# Patient Record
Sex: Female | Born: 1960
Health system: Southern US, Community
[De-identification: ages and names within clinical notes are randomized; demographics above are authoritative.]

## PROBLEM LIST (undated history)

## (undated) DIAGNOSIS — F988 Other specified behavioral and emotional disorders with onset usually occurring in childhood and adolescence: Secondary | ICD-10-CM

## (undated) DIAGNOSIS — G709 Myoneural disorder, unspecified: Secondary | ICD-10-CM

## (undated) DIAGNOSIS — Z21 Asymptomatic human immunodeficiency virus [HIV] infection status: Secondary | ICD-10-CM

## (undated) DIAGNOSIS — J189 Pneumonia, unspecified organism: Secondary | ICD-10-CM

## (undated) DIAGNOSIS — B20 Human immunodeficiency virus [HIV] disease: Secondary | ICD-10-CM

## (undated) DIAGNOSIS — E785 Hyperlipidemia, unspecified: Secondary | ICD-10-CM

## (undated) DIAGNOSIS — G8929 Other chronic pain: Secondary | ICD-10-CM

## (undated) DIAGNOSIS — F419 Anxiety disorder, unspecified: Secondary | ICD-10-CM

## (undated) HISTORY — PX: CARPAL TUNNEL RELEASE: SHX101

## (undated) HISTORY — DX: Hyperlipidemia, unspecified: E78.5

## (undated) HISTORY — DX: Other chronic pain: G89.29

## (undated) HISTORY — DX: Myoneural disorder, unspecified: G70.9

## (undated) HISTORY — DX: Asymptomatic human immunodeficiency virus (hiv) infection status: Z21

## (undated) HISTORY — DX: Human immunodeficiency virus (HIV) disease: B20

## (undated) HISTORY — DX: Anxiety disorder, unspecified: F41.9

## (undated) HISTORY — DX: Other specified behavioral and emotional disorders with onset usually occurring in childhood and adolescence: F98.8

---
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
## 1989-07-11 HISTORY — PX: ABDOMINAL HYSTERECTOMY: SHX81

## 1991-07-12 HISTORY — PX: TARSAL TUNNEL RELEASE: SUR1099

## 1991-07-12 HISTORY — PX: TUMOR REMOVAL: SHX12

## 2008-02-21 ENCOUNTER — Ambulatory Visit: Payer: Self-pay | Admitting: Family Medicine

## 2008-02-21 DIAGNOSIS — I10 Essential (primary) hypertension: Secondary | ICD-10-CM

## 2008-02-21 DIAGNOSIS — E119 Type 2 diabetes mellitus without complications: Secondary | ICD-10-CM | POA: Insufficient documentation

## 2008-02-21 DIAGNOSIS — E785 Hyperlipidemia, unspecified: Secondary | ICD-10-CM

## 2008-02-22 ENCOUNTER — Encounter: Payer: Self-pay | Admitting: Family Medicine

## 2008-02-22 ENCOUNTER — Telehealth (INDEPENDENT_AMBULATORY_CARE_PROVIDER_SITE_OTHER): Payer: Self-pay | Admitting: *Deleted

## 2008-02-22 LAB — CONVERTED CEMR LAB
AST: 14 units/L (ref 0–37)
Albumin: 4.3 g/dL (ref 3.5–5.2)
Alkaline Phosphatase: 105 units/L (ref 39–117)
Calcium: 9.2 mg/dL (ref 8.4–10.5)
Chloride: 109 meq/L (ref 96–112)
Glucose, Bld: 80 mg/dL (ref 70–99)
Potassium: 4.2 meq/L (ref 3.5–5.3)
Sodium: 142 meq/L (ref 135–145)
Total Protein: 7.2 g/dL (ref 6.0–8.3)

## 2008-03-31 ENCOUNTER — Encounter: Admission: RE | Admit: 2008-03-31 | Discharge: 2008-03-31 | Payer: Self-pay | Admitting: Family Medicine

## 2008-04-01 ENCOUNTER — Telehealth: Payer: Self-pay | Admitting: Family Medicine

## 2008-05-30 ENCOUNTER — Telehealth: Payer: Self-pay | Admitting: Family Medicine

## 2008-09-02 ENCOUNTER — Telehealth: Payer: Self-pay | Admitting: Family Medicine

## 2008-09-02 ENCOUNTER — Ambulatory Visit: Payer: Self-pay | Admitting: Family Medicine

## 2008-09-02 DIAGNOSIS — G4700 Insomnia, unspecified: Secondary | ICD-10-CM

## 2008-09-02 DIAGNOSIS — F329 Major depressive disorder, single episode, unspecified: Secondary | ICD-10-CM

## 2008-09-02 LAB — CONVERTED CEMR LAB
Creatinine,U: 50 mg/dL
Hgb A1c MFr Bld: 5.8 %

## 2008-10-01 ENCOUNTER — Telehealth: Payer: Self-pay | Admitting: Family Medicine

## 2008-10-29 ENCOUNTER — Encounter: Payer: Self-pay | Admitting: Family Medicine

## 2008-12-31 ENCOUNTER — Encounter: Payer: Self-pay | Admitting: Family Medicine

## 2009-01-28 ENCOUNTER — Ambulatory Visit: Payer: Self-pay | Admitting: Family Medicine

## 2009-01-28 DIAGNOSIS — M545 Low back pain: Secondary | ICD-10-CM

## 2009-01-28 LAB — CONVERTED CEMR LAB: Hgb A1c MFr Bld: 5.7 %

## 2009-01-29 LAB — CONVERTED CEMR LAB
ALT: 17 units/L (ref 0–35)
AST: 17 units/L (ref 0–37)
Alkaline Phosphatase: 90 units/L (ref 39–117)
Calcium: 9.9 mg/dL (ref 8.4–10.5)
Chloride: 107 meq/L (ref 96–112)
Cholesterol, target level: 200 mg/dL
Creatinine, Ser: 0.81 mg/dL (ref 0.40–1.20)
HDL: 57 mg/dL (ref >39)
LDL Cholesterol: 122 mg/dL — ABNORMAL HIGH (ref 0–99)
TSH: 2.238 microintl units/mL (ref 0.350–4.500)
Total Bilirubin: 0.4 mg/dL (ref 0.3–1.2)
Total CHOL/HDL Ratio: 3.6
VLDL: 25 mg/dL (ref 0–40)

## 2009-04-20 ENCOUNTER — Telehealth: Payer: Self-pay | Admitting: Family Medicine

## 2009-04-28 ENCOUNTER — Ambulatory Visit: Payer: Self-pay | Admitting: Family Medicine

## 2009-04-28 DIAGNOSIS — F17200 Nicotine dependence, unspecified, uncomplicated: Secondary | ICD-10-CM | POA: Insufficient documentation

## 2009-05-26 ENCOUNTER — Ambulatory Visit: Payer: Self-pay | Admitting: Family Medicine

## 2009-05-26 DIAGNOSIS — M25569 Pain in unspecified knee: Secondary | ICD-10-CM

## 2009-05-27 ENCOUNTER — Encounter: Payer: Self-pay | Admitting: Family Medicine

## 2009-05-27 LAB — CONVERTED CEMR LAB
LDL Cholesterol: 74 mg/dL (ref 0–99)
Total CHOL/HDL Ratio: 3.2
VLDL: 22 mg/dL (ref 0–40)

## 2009-06-11 ENCOUNTER — Telehealth: Payer: Self-pay | Admitting: Family Medicine

## 2009-06-18 ENCOUNTER — Encounter: Payer: Self-pay | Admitting: Family Medicine

## 2009-06-19 ENCOUNTER — Encounter: Payer: Self-pay | Admitting: Family Medicine

## 2009-06-25 ENCOUNTER — Encounter: Payer: Self-pay | Admitting: Family Medicine

## 2009-07-13 ENCOUNTER — Encounter: Payer: Self-pay | Admitting: Family Medicine

## 2009-07-20 ENCOUNTER — Encounter: Payer: Self-pay | Admitting: Family Medicine

## 2009-07-30 ENCOUNTER — Encounter: Payer: Self-pay | Admitting: Family Medicine

## 2009-08-16 ENCOUNTER — Encounter: Payer: Self-pay | Admitting: Family Medicine

## 2009-08-17 ENCOUNTER — Ambulatory Visit: Payer: Self-pay | Admitting: Family Medicine

## 2009-08-27 ENCOUNTER — Ambulatory Visit: Payer: Self-pay | Admitting: Family Medicine

## 2009-08-28 ENCOUNTER — Telehealth (INDEPENDENT_AMBULATORY_CARE_PROVIDER_SITE_OTHER): Payer: Self-pay | Admitting: *Deleted

## 2009-09-02 ENCOUNTER — Telehealth: Payer: Self-pay | Admitting: Family Medicine

## 2009-09-03 ENCOUNTER — Encounter: Payer: Self-pay | Admitting: Family Medicine

## 2009-11-17 ENCOUNTER — Telehealth: Payer: Self-pay | Admitting: Family Medicine

## 2009-11-23 ENCOUNTER — Encounter: Payer: Self-pay | Admitting: Family Medicine

## 2010-01-25 ENCOUNTER — Encounter: Payer: Self-pay | Admitting: Family Medicine

## 2010-05-20 ENCOUNTER — Ambulatory Visit: Payer: Self-pay | Admitting: Family Medicine

## 2010-05-20 LAB — CONVERTED CEMR LAB
Creatinine,U: 300 mg/dL
Microalbumin U total vol: 30 mg/L

## 2010-05-21 ENCOUNTER — Telehealth: Payer: Self-pay | Admitting: Family Medicine

## 2010-06-01 ENCOUNTER — Encounter: Admission: RE | Admit: 2010-06-01 | Discharge: 2010-06-01 | Payer: Self-pay | Admitting: Family Medicine

## 2010-06-01 ENCOUNTER — Encounter: Payer: Self-pay | Admitting: Family Medicine

## 2010-06-02 DIAGNOSIS — D696 Thrombocytopenia, unspecified: Secondary | ICD-10-CM

## 2010-06-02 LAB — CONVERTED CEMR LAB
ALT: 16 units/L (ref 0–35)
AST: 15 units/L (ref 0–37)
Alkaline Phosphatase: 92 units/L (ref 39–117)
BUN: 21 mg/dL (ref 6–23)
Basophils Absolute: 0 10*3/uL (ref 0.0–0.1)
Basophils Relative: 1 % (ref 0–1)
Calcium: 9.2 mg/dL (ref 8.4–10.5)
Chloride: 109 meq/L (ref 96–112)
Creatinine, Ser: 0.7 mg/dL (ref 0.40–1.20)
Eosinophils Absolute: 0.1 10*3/uL (ref 0.0–0.7)
HDL: 59 mg/dL (ref >39)
LDL Cholesterol: 82 mg/dL (ref 0–99)
MCHC: 32.8 g/dL (ref 30.0–36.0)
MCV: 89 fL (ref 78.0–100.0)
Monocytes Absolute: 0.4 10*3/uL (ref 0.1–1.0)
Monocytes Relative: 6 % (ref 3–12)
Neutro Abs: 4.7 10*3/uL (ref 1.7–7.7)
Neutrophils Relative %: 71 % (ref 43–77)
Potassium: 4.5 meq/L (ref 3.5–5.3)
RDW: 14.9 % (ref 11.5–15.5)
TSH: 0.658 microintl units/mL (ref 0.350–4.500)
Total CHOL/HDL Ratio: 2.8

## 2010-06-08 ENCOUNTER — Encounter: Payer: Self-pay | Admitting: Family Medicine

## 2010-06-08 ENCOUNTER — Encounter: Admission: RE | Admit: 2010-06-08 | Discharge: 2010-06-08 | Payer: Self-pay | Admitting: Family Medicine

## 2010-06-09 LAB — CONVERTED CEMR LAB: Uric Acid, Serum: 5.2 mg/dL (ref 2.4–7.0)

## 2010-07-21 ENCOUNTER — Telehealth: Payer: Self-pay | Admitting: Family Medicine

## 2010-08-01 ENCOUNTER — Encounter: Payer: Self-pay | Admitting: Family Medicine

## 2010-08-10 NOTE — Letter (Signed)
Summary: [facility]  [facility]   Imported By: Lanelle Bal 08/28/2009 11:00:58  _____________________________________________________________________  External Attachment:    Type:   Image     Comment:   External Document

## 2010-08-10 NOTE — Progress Notes (Signed)
----   Converted from flag ---- ---- 08/28/2009 8:16 AM, Nani Gasser MD wrote: Needs admit notes and d/c summary from her hospitalization for overdose at [facility]. ------------------------------  faxed for medical records from [facility]. KJ LPN

## 2010-08-10 NOTE — Progress Notes (Signed)
Summary: meds  Phone Note Call from Patient   Caller: Patient Call For: Nani Gasser MD Summary of Call: Pt called and states she was supposed to get  a refil of [facility] Initial call taken by: Avon Gully CMA, Duncan Dull),  May 21, 2010 2:35 PM  Follow-up for Phone Call        Rx Called In Follow-up by: Nani Gasser MD,  May 21, 2010 3:04 PM    Prescriptions: LEXAPRO 20 MG TABS (ESCITALOPRAM OXALATE) Take 1-2 tablets by mouth once a day  #60 x 3   Entered and Authorized by:   Nani Gasser MD   Signed by:   Nani Gasser MD on 05/21/2010   Method used:   Electronically to        [facility] (retail)       [address]       [place], [place]  [address]       Ph: [phone]       Fax: [phone]   RxID:   [id number] SEROQUEL XR 150 MG XR24H-TAB (QUETIAPINE FUMARATE) Take one tablet by mouth at bedtime  #30 x 3   Entered and Authorized by:   Nani Gasser MD   Signed by:   Nani Gasser MD on 05/21/2010   Method used:   Electronically to        [facility] (retail)       [address]       [place], [place]  [address]       Ph: [phone]       Fax: [phone]   RxID:   [id number]

## 2010-08-10 NOTE — Consult Note (Signed)
Summary: [facility]   Imported By: Lanelle Bal 07/23/2009 13:46:05  _____________________________________________________________________  External Attachment:    Type:   Image     Comment:   External Document

## 2010-08-10 NOTE — Assessment & Plan Note (Signed)
Summary: HOSPITAL F/U   Vital Signs:  Patient profile:   50 year old female Height:      66 inches Weight:      221 pounds Pulse rate:   85 / minute BP sitting:   117 / 70  (left arm) Cuff size:   large  Vitals Entered By: Kathlene November (August 27, 2009 3:40 PM) CC: hospital followup- took too many Soma to try and get some sleep   Primary Care Provider:  Nani Gasser MD  CC:  hospital followup- took too many Soma to try and get some sleep.  History of Present Illness: hospital followup- took too many Soma to try and get some sleep. Was admitted to [facility] for OD of soma on 08/13/09.  Denies any intential OD. Says too too many to try to sleep.  Now has stopped teh soma adn getting spasm in her medications.  had her stomach pumped. Did have a seizure. I don't have notes but will obtain these from her admission. Stil having trouble sleeping. Insomnia has been a chronic problem for her.    Was seeing Dr. Marlena Clipper (neruology).  He is now not conviced that she has MS so is considering a second opinoin.  Says this has been difficulty to accept because she has been told has MS for the last 20 years.   Current Medications (verified): 1)  Oxycodone Hcl 15 Mg  Tabs (Oxycodone Hcl) .... Take 2 Tabs in The Am 2 Tabs At Lunch 2 Tabs in The Evening and 1 Tab At Bedtime 2)  Carisoprodol 350 Mg  Tabs (Carisoprodol) .... Take 2-4 Tabs By Mouth Daily 3)  Topamax 25 Mg  Tabs (Topiramate) .... Take 4 Tablets By Mouth Once A Day 4)  Lyrica 150 Mg Caps (Pregabalin) .... Take 1 Tablet By Mouth Two Times A Day 5)  Lisinopril 20 Mg  Tabs (Lisinopril) .... Take One Tablet By Mouth Once A Day 6)  Crestor 20 Mg Tabs (Rosuvastatin Calcium) .... Take 1 Tablet By Mouth Once A Day 7)  Imitrex 100 Mg  Tabs (Sumatriptan Succinate) .... As Needed For Migraines 8)  Humalog 100 Unit/ml  Soln (Insulin Lispro (Human)) .... Use Per Ssi 9)  Please Refit Brace Form The Left Leg .... Dx: Leg Weakness and Gait  Instability 10)  Bd Insulin Syringe 30g X 1/2" 0.5 Ml  Misc (Insulin Syringe-Needle U-100) .... Use As Directed Up To 6 Times Per Day Dx:250.00 11)  Tramadol Hcl 50 Mg Tabs (Tramadol Hcl) .... One By Mouth Every 6 Hours As Needed As Needed Pain 12)  Seroquel Xr 150 Mg Xr24h-Tab (Quetiapine Fumarate) .... Take One Tablet By Mouth At Bedtime 13)  Benzonatate 100 Mg Caps (Benzonatate) .... Take One Tablet By Mouth A T Bedtime 14)  Copaxone 20 Mg/ml Kit (Glatiramer Acetate) .... Inject 1 Ml Everyday 15)  Nystatin 500000 Unit Tabs (Nystatin) .... Take One Tablet By Mouth Up To Three Times A Day 16)  Lorazepam 0.5 Mg Tabs (Lorazepam) .... Take One Tablet By Mouth Up To Three Times A Day 17)  Clonidine Hcl 0.1 Mg Tabs (Clonidine Hcl) .... Take One - Two Tablets By Mouth Daily 18)  Adderall 10 Mg Tabs (Amphetamine-Dextroamphetamine) .... Take One Tablet By Mouth Once A Day 19)  Clonazepam 1 Mg Tabs (Clonazepam) .... Take 1-2 Tablets By Mouth Daily 20)  Lexapro 20 Mg Tabs (Escitalopram Oxalate) .... Take 1-2 Tablets By Mouth Once A Day  Allergies (verified): 1)  ! * Cillins  Comments:  Nurse/Medical Assistant: The patient's medications and allergies were reviewed with the patient and were updated in the Medication and Allergy Lists. Kathlene November (August 27, 2009 3:41 PM)  Physical Exam  General:  Well-developed,well-nourished,in no acute distress; alert,appropriate and cooperative throughout examination Head:  Normocephalic and atraumatic without obvious abnormalities. No apparent alopecia or balding. Lungs:  Normal respiratory effort, chest expands symmetrically. Lungs are clear to auscultation, no crackles or wheezes. Heart:  Normal rate and regular rhythm. S1 and S2 normal without gallop, murmur, click, rub or other extra sounds. Skin:  no rashes.   Psych:  Cognition and judgment appear intact. Alert and cooperative with normal attention span and concentration. No apparent delusions,  illusions, hallucinations   Impression & Recommendations:  Problem # 1:  INSOMNIA (ICD-780.52) I agree with pt that she should not restart the soma. She was interested in restarting flexeril but I explained that she needs to speak with a pain specialist about this. Afer her OD I do feel this is appropriate.  I would certainly be happy to refer her to a sleep specialist if she would like.    Problem # 2:  DIABETES MELLITUS, CONTROLLED (ICD-250.00) She says the checked her labs in teh hospital.  She hasn't been here for DM f/u in almost a year. [facility]. Suspect she is still missing some thinks like a microalbumin. Will review and let her know an have her schedule a DM f/u.   Her updated medication list for this problem includes:    Lisinopril 20 Mg Tabs (Lisinopril) .Marland Kitchen... Take one tablet by mouth once a day    Humalog 100 Unit/ml Soln (Insulin lispro (human)) ..... Use per ssi  Problem # 3:  HYPERTENSION, BENIGN (ICD-401.1) Looks great today.  Her updated medication list for this problem includes:    Lisinopril 20 Mg Tabs (Lisinopril) .Marland Kitchen... Take one tablet by mouth once a day    Clonidine Hcl 0.1 Mg Tabs (Clonidine hcl) .Marland Kitchen... Take one - two tablets by mouth daily  Complete Medication List: 1)  Oxycodone Hcl 30 Mg Tabs (Oxycodone hcl) .... Take up to 6 a day. 2)  Topamax 25 Mg Tabs (Topiramate) .... Take 4 tablets by mouth once a day 3)  Lyrica 150 Mg Caps (Pregabalin) .... Take 1 tablet by mouth two times a day 4)  Lisinopril 20 Mg Tabs (Lisinopril) .... Take one tablet by mouth once a day 5)  Crestor 20 Mg Tabs (Rosuvastatin calcium) .... Take 1 tablet by mouth once a day 6)  Humalog 100 Unit/ml Soln (Insulin lispro (human)) .... Use per ssi 7)  Please Refit Brace Form The Left Leg  .... Dx: leg weakness and gait instability 8)  Bd Insulin Syringe 30g X 1/2" 0.5 Ml Misc (Insulin syringe-needle u-100) .... Use as directed up to 6 times per day  dx:250.00 9)  Seroquel Xr 150 Mg Xr24h-tab (Quetiapine fumarate) .... Take one tablet by mouth at bedtime 10)  Lorazepam 0.5 Mg Tabs (Lorazepam) .... Take one tablet by mouth up to three times a day 11)  Clonidine Hcl 0.1 Mg Tabs (Clonidine hcl) .... Take one - two tablets by mouth daily 12)  Adderall 10 Mg Tabs (Amphetamine-dextroamphetamine) .... Take one tablet by mouth once a day 13)  Clonazepam 1 Mg Tabs (Clonazepam) .... Take 1-2 tablets by mouth daily 14)  Lexapro 20 Mg Tabs (Escitalopram oxalate) .... Take 1-2 tablets by mouth once a day Prescriptions: PLEASE REFIT BRACE FORM  THE LEFT LEG Dx: Leg weakness and gait instability  #1 x 0   Entered and Authorized by:   Nani Gasser MD   Signed by:   Nani Gasser MD on 08/27/2009   Method used:   Print then Give to Patient   RxID:   [id number] BD INSULIN SYRINGE 30G X 1/2" 0.5 ML  MISC (INSULIN SYRINGE-NEEDLE U-100) USE AS DIRECTED UP TO 6 TIMES PER DAY DX:250.00  #3MTH SUPPLY x 3   Entered and Authorized by:   Nani Gasser MD   Signed by:   Nani Gasser MD on 08/27/2009   Method used:   Print then Give to Patient   RxID:   [id number]

## 2010-08-10 NOTE — Assessment & Plan Note (Signed)
Summary: BAck Pain, DM, jaundice   Vital Signs:  Patient profile:   50 year old female Height:      66 inches Weight:      238 pounds Pulse rate:   100 / minute BP sitting:   139 / 80  (right arm) Cuff size:   regular  Vitals Entered By: Avon Gully CMA, Duncan Dull) (May 20, 2010 3:28 PM)   CC: f/u DM   Primary Care Avari Gelles:  Nani Gasser MD  CC:  f/u DM.  History of Present Illness:   Hs noticed some jaudice in her eyes. she says she has had two episodes of this previously lasting for just a couple of days.  She has had a total of 3 episodes in the last year.  She denies any prior history of liver problems or hepatitis.   Sister died form liver and pancreatic cancer (stage 4 ). Feels fatigued as well. she also notes a recent significant weight gain.  She says she has been trying to eat well and has been following a diabetic diet and is frustrated that she has gained weight.  She is not getting any regular exercise.  She says she's only in 1500 calories a day on average.  She rarely uses her insulin only when her sugars are elevated.  She is also here for diabetic follow-up today.  She did not bring in her glucometer.  She only checks them occasionally.  She is also having some Pain in her lower back. Hx of OA in her back.  Her low back feels weak wiht house work.  Feels like has to sit down after very little effort. her back pain is exacerbated by work and relieved some by sitting that her pain is still persistent.  According to the patient she does take pain medications.     Diabetes Management History:      The patient is a 50 years old female who comes in for evaluation of DM Type 2.  She has not been enrolled in the "Diabetic Education Program".  She states understanding of dietary principles and is following her diet appropriately.  She is checking home blood sugars.  She says that she is not exercising regularly.        Hypoglycemic symptoms are not occurring.  No  hyperglycemic symptoms are reported.  Other comments include: Just using insulin when her sugars are high. Says she is eating a diabetic diet a day. Says she has been trying to lose weight.  Trying to walk once a day. .        There are no symptoms to suggest diabetic complications.  No changes have been made to her treatment plan since last visit.  Treatment plan changes were initiated by patient.    Current Medications (verified): 1)  Oxycodone Hcl 30 Mg Tabs (Oxycodone Hcl) .... Take Up To 6 A Day. 2)  Lyrica 150 Mg Caps (Pregabalin) .... Take 1 Tablet By Mouth Two Times A Day 3)  Lisinopril 20 Mg  Tabs (Lisinopril) .... Take One Tablet By Mouth Once A Day 4)  Crestor 20 Mg Tabs (Rosuvastatin Calcium) .... Take 1 Tablet By Mouth Once A Day 5)  Humalog 100 Unit/ml  Soln (Insulin Lispro (Human)) .... Use Per Ssi 6)  Please Refit Brace Form The Left Leg .... Dx: Leg Weakness and Gait Instability 7)  Bd Insulin Syringe 30g X 1/2" 0.5 Ml  Misc (Insulin Syringe-Needle U-100) .... Use As Directed Up To 6 Times Per  Day Dx:250.00 8)  Seroquel Xr 150 Mg Xr24h-Tab (Quetiapine Fumarate) .... Take One Tablet By Mouth At Bedtime 9)  Adderall 10 Mg Tabs (Amphetamine-Dextroamphetamine) .... Take One Tablet By Mouth Once A Day 10)  Lexapro 20 Mg Tabs (Escitalopram Oxalate) .... Take 1-2 Tablets By Mouth Once A Day 11)  Amitriptyline Hcl 25 Mg Tabs (Amitriptyline Hcl) .... Take 3 Tablets By Mouth Three Times A Day At Bedtime  Allergies (verified): 1)  ! * Cillins  Comments:  Nurse/Medical Assistant: The patient's medications and allergies were reviewed with the patient and were updated in the Medication and Allergy Lists. Avon Gully CMA, Duncan Dull) (May 20, 2010 3:37 PM)  Past History:  Past Surgical History: Last updated: 05/26/2009 c/sec 1980 Hernia repair 1976 Complete  hystectomy 1991 Carpal Tunnel both wrist [id number] Tarsal tunne surgery Left ankle 1993 or 4.  Tumor removed form left  thigh 1993.   Family History: Father with hi cholesterol, HTN, DM, MI Aunt with BrCa Mother with Brain Ca and Lung Ca, smoker, COPD 3 uncles and 2 aunts with stroke Siste with hepatic and pancreatic cancer, died.    Physical Exam  General:  Well-developed,well-nourished,in no acute distress; alert,appropriate and cooperative throughout examination Head:  Normocephalic and atraumatic without obvious abnormalities. No apparent alopecia or balding. Eyes:  no sclera icterus today.  Lungs:  Normal respiratory effort, chest expands symmetrically. Lungs are clear to auscultation, no crackles or wheezes. Heart:  Normal rate and regular rhythm. S1 and S2 normal without gallop, murmur, click, rub or other extra sounds. Abdomen:  soft, normal bowel sounds, no distention, no masses, no hepatomegaly, and no splenomegaly.  TEnder diffusely but very tender over the RLQ and the LLQ.   Msk:  Normal flexion, dec extension.  Normal side bending. Normal rotation of lumbar spine to the right. Dec to the left.  she is tender over the lumbar spine and the SI joints bilaterally.  negative straight leg raise bilaterally.  Hip knee and ankle strength are 5 out of 5. Pulses:  Radial 2+  Neurologic:  alert & oriented X3 and gait normal.   Skin:  No jaudcie on exam today.  Cervical Nodes:  no anterior cervical adenopathy that she feels tender on exam.   Psych:  Cognition and judgment appear intact. Alert and cooperative with normal attention span and concentration. No apparent delusions, illusions, hallucinations   Impression & Recommendations:  Problem # 1:  BACK PAIN, LUMBAR (ICD-724.2) This is part of her chronic pain. Not really sure why she is not addressing this with pain management. Will get an lumbar film as well.  Recommend PT if xray is fairly normal. Will call with the results.    certainly her recent weight gain may be contributing to her pain levels. Her updated medication list for this problem  includes:    Oxycodone Hcl 30 Mg Tabs (Oxycodone hcl) .Marland Kitchen... Take up to 6 a day.  Orders: T-DG Lumbar Spine 2-3 Views (72100)  Problem # 2:  DIABETES MELLITUS, CONTROLLED (ICD-250.00) Well controlled Looks great today.  Let change to metformin daily and then can stop the inuslin. though I have a feeling she will probably not take metformin.  I do recommend weight loss.  That she really is only eating 1500 calories a day she should not be gaining weight.  Will check TSH to make sure she is not hypothyroid. declined flu vaccine today. Her updated medication list for this problem includes:    Lisinopril 20 Mg Tabs (Lisinopril) .Marland KitchenMarland KitchenMarland KitchenMarland Kitchen  Take one tablet by mouth once a day    Humalog 100 Unit/ml Soln (Insulin lispro (human)) ..... Use per ssi    Metformin Hcl 500 Mg Xr24h-tab (Metformin hcl) .Marland Kitchen... Take 1 tablet by mouth once a day  Orders: Fingerstick (36416) Hemoglobin A1C (83036) Urine Microalbumin (60737) Creatinine  (82570) T-TSH (10626-94854)  Problem # 3:  JAUNDICE (ICD-782.4) this is unclear in etiology.  Surprise that this is the first time she's mentioned jaundiced.  She does not have a distended liver on exam today but I will check her liver enzymes and a CBC.  I am concerned that her sister has a history of hepatic and pancreatic cancer.  I asked her she knew the cause of her suspected cause for her sister's cancer and she said she did not.  She says her sister was not a drinker. Orders: T-CBC w/Diff ([id number])  Complete Medication List: 1)  Oxycodone Hcl 30 Mg Tabs (Oxycodone hcl) .... Take up to 6 a day. 2)  Lyrica 150 Mg Caps (Pregabalin) .... Take 1 tablet by mouth two times a day 3)  Lisinopril 20 Mg Tabs (Lisinopril) .... Take one tablet by mouth once a day 4)  Crestor 20 Mg Tabs (Rosuvastatin calcium) .... Take 1 tablet by mouth once a day 5)  Humalog 100 Unit/ml Soln (Insulin lispro (human)) .... Use per ssi 6)  Please Refit Brace Form The Left Leg  .... Dx: leg weakness  and gait instability 7)  Bd Insulin Syringe 30g X 1/2" 0.5 Ml Misc (Insulin syringe-needle u-100) .... Use as directed up to 6 times per day dx:250.00 8)  Seroquel Xr 150 Mg Xr24h-tab (Quetiapine fumarate) .... Take one tablet by mouth at bedtime 9)  Adderall 10 Mg Tabs (Amphetamine-dextroamphetamine) .... Take one tablet by mouth once a day 10)  Lexapro 20 Mg Tabs (Escitalopram oxalate) .... Take 1-2 tablets by mouth once a day 11)  Amitriptyline Hcl 25 Mg Tabs (Amitriptyline hcl) .... Take 3 tablets by mouth three times a day at bedtime 12)  Metformin Hcl 500 Mg Xr24h-tab (Metformin hcl) .... Take 1 tablet by mouth once a day  Other Orders: T-Comprehensive Metabolic Panel [phone]) T-Lipid Profile (96789-38101)  Diabetes Management Assessment/Plan:      The following lipid goals have been established for the patient: Total cholesterol goal of 200; LDL cholesterol goal of 100; HDL cholesterol goal of 40; Triglyceride goal of 150.    Contraindications/Deferment of Procedures/Staging:    Test/Procedure: FLU VAX    Reason for deferment: patient declined   Patient Instructions: 1)  Let change to metformin daily and then can stop the inuslin.  2)  Please schedule a follow-up appointment in 2 weeks to discuss some of your other problems. 3)  We call you with the xray results and the labs.   Prescriptions: METFORMIN HCL 500 MG XR24H-TAB (METFORMIN HCL) Take 1 tablet by mouth once a day  #30 x 1   Entered and Authorized by:   Nani Gasser MD   Signed by:   Nani Gasser MD on 05/20/2010   Method used:   Electronically to        [facility] (retail)       [address]       [place], [place]  [address]       Ph: [phone]       Fax: [phone]   RxID:   [id number]    Orders Added: 1)  Fingerstick [36416] 2)  Hemoglobin A1C [83036] 3)  Urine Microalbumin [  82044] 4)  Creatinine  [82570] 5)  T-Comprehensive Metabolic Panel [80053-22900] 6)  T-Lipid Profile  [80061-22930] 7)  T-TSH [23762-83151] 8)  T-CBC w/Diff [76160-73710] 9)  T-DG Lumbar Spine 2-3 Views [72100] 10)  Est. Patient Level IV [62694]    Laboratory Results   Urine Tests  Date/Time Received: 05/20/10 Date/Time Reported: 05/20/10  Microalbumin (urine): 30 mg/L Creatinine: 300mg /dL  A:C Ratio 30mg /g  Blood Tests   Date/Time Received: 05/20/10 Date/Time Reported: 05/20/10  HGBA1C: 6.8%   (Normal Range: Non-Diabetic - 3-6%   Control Diabetic - 6-8%)

## 2010-08-10 NOTE — Letter (Signed)
Summary: [facility]  [facility]   Imported By: Marylou Mccoy 01/25/2010 17:29:15  _____________________________________________________________________  External Attachment:    Type:   Image     Comment:   External Document

## 2010-08-10 NOTE — Progress Notes (Signed)
Summary: needs a new referral  Phone Note Call from Patient   Caller: Patient Summary of Call: pt. called and states that she was recently referred to Dr.Pinyan's office but had to reschedule 3x so they dismissed her from their practice... She needs to be referred to another office, and is asking that you do so. Pt. Can be reached at [phone] and would love to stay in the [place] area.Michaelle Copas  Nov 17, 2009 3:44 PM  Initial call taken by: Michaelle Copas,  Nov 17, 2009 3:44 PM  Follow-up for Phone Call        No pt's responsibility to make her own appt. We will be happy to give her some phone numbers but she can call and schedule.  Follow-up by: Nani Gasser MD,  Nov 17, 2009 4:45 PM  Additional Follow-up for Phone Call Additional follow up Details #1::        Pt. was able to reschedule with [facility] since they actually cancelled one of her appts Additional Follow-up by: Michaelle Copas,  Nov 18, 2009 11:03 AM

## 2010-08-10 NOTE — Consult Note (Signed)
Summary: [facility]   Imported By: Lanelle Bal 09/14/2009 14:22:35  _____________________________________________________________________  External Attachment:    Type:   Image     Comment:   External Document

## 2010-08-10 NOTE — Letter (Signed)
Summary: [facility]  [facility]   Imported By: Lanelle Bal 12/03/2009 12:56:37  _____________________________________________________________________  External Attachment:    Type:   Image     Comment:   External Document  Appended Document: [facility]

## 2010-08-10 NOTE — Progress Notes (Signed)
Summary: Lost Rx  Phone Note Call from Patient   Complaint: Chest Pain Summary of Call: pt lost Rx's that she was given at last OV and wants to know if you will re-print them and she will pick them up. Pt states normally puts them in her "little red book" but did not that day and now gone.  Also pt needs a referral to Neurologist- states no longer has one was seeing Dr. Lenise Arena Initial call taken by: Kathlene November,  September 02, 2009 9:55 AM  Follow-up for Phone Call        OK to pick up rx.  Follow-up by: Nani Gasser MD,  September 02, 2009 10:14 AM    Prescriptions: PLEASE REFIT BRACE FORM THE LEFT LEG Dx: Leg weakness and gait instability  #1 x 0   Entered and Authorized by:   Nani Gasser MD   Signed by:   Nani Gasser MD on 09/02/2009   Method used:   Print then Give to Patient   RxID:   [id number] BD INSULIN SYRINGE 30G X 1/2" 0.5 ML  MISC (INSULIN SYRINGE-NEEDLE U-100) USE AS DIRECTED UP TO 6 TIMES PER DAY DX:250.00  #3MTH SUPPLY x 3   Entered and Authorized by:   Nani Gasser MD   Signed by:   Nani Gasser MD on 09/02/2009   Method used:   Print then Give to Patient   RxID:   [id number]

## 2010-08-10 NOTE — Letter (Signed)
Summary: [facility]  [facility]   Imported By: Lanelle Bal 09/21/2009 13:43:29  _____________________________________________________________________  External Attachment:    Type:   Image     Comment:   External Document

## 2010-08-10 NOTE — Letter (Signed)
Summary: [facility]  [facility]   Imported By: Lanelle Bal 07/30/2009 11:02:32  _____________________________________________________________________  External Attachment:    Type:   Image     Comment:   External Document

## 2010-08-10 NOTE — Consult Note (Signed)
Summary: [facility]   Imported By: Lanelle Bal 08/10/2009 08:28:01  _____________________________________________________________________  External Attachment:    Type:   Image     Comment:   External Document

## 2010-08-12 NOTE — Progress Notes (Signed)
Summary: gastrointestinal virus  Phone Note Call from Patient Call back at Home Phone [phone]   Caller: Patient Call For: Nani Gasser MD Summary of Call: Pt calls and states has virus, vomiting, diarrhea, cramping, body aches. Wants to know if can get med and not come in. Has had for 3 days. Would like some nausea med sent to [facility]. Initial call taken by: Kathlene November LPN,  July 21, 2010 10:42 AM  Follow-up for Phone Call        Will send in nausea med but if has for more than the next 24 hours needs appt.  3 days it on the longer end.  Follow-up by: Nani Gasser MD,  July 21, 2010 10:50 AM    New/Updated Medications: PROMETHAZINE HCL 25 MG TABS (PROMETHAZINE HCL) one by mouthe every 6 hours as needed nausea. Prescriptions: PROMETHAZINE HCL 25 MG TABS (PROMETHAZINE HCL) one by mouthe every 6 hours as needed nausea.  #12 x 0   Entered and Authorized by:   Nani Gasser MD   Signed by:   Nani Gasser MD on 07/21/2010   Method used:   Electronically to        [facility] (retail)       [address]       [place], [place]  [address]       Ph: [phone]       Fax: [phone]   RxID:   [id number]

## 2010-08-16 ENCOUNTER — Encounter: Payer: Self-pay | Admitting: Family Medicine

## 2010-08-16 ENCOUNTER — Telehealth: Payer: Self-pay | Admitting: Family Medicine

## 2010-08-20 ENCOUNTER — Ambulatory Visit: Payer: Self-pay | Admitting: Family Medicine

## 2010-08-23 ENCOUNTER — Ambulatory Visit: Payer: Self-pay | Admitting: Family Medicine

## 2010-08-26 ENCOUNTER — Ambulatory Visit: Payer: Medicare Other | Admitting: Family Medicine

## 2010-08-26 NOTE — Progress Notes (Signed)
Summary: Letter for apartment   Phone Note Call from Patient   Caller: Patient Summary of Call: Pt [facility] requesting letter to be written to her apartment complex stating she needs an extension on her inspection. Apt does inspections and Pt has not been able to prepare home for the inspection due to knees and back. Pt requests a 1 week extension. Please advise. Initial call taken by: Payton Spark CMA,  August 16, 2010 1:01 PM  Follow-up for Phone Call        Letter is ready. Does she want [place] to fax it? Follow-up by: Nani Gasser MD,  August 16, 2010 1:06 PM  Additional Follow-up for Phone Call Additional follow up Details #1::        pt notified an letter faxed Additional Follow-up by: Avon Gully CMA, Duncan Dull),  August 16, 2010 3:04 PM

## 2010-08-26 NOTE — Letter (Signed)
Summary: Generic Letter  [facility]  [address], [address]   [place], [place] [address]   Phone: [phone]  Fax: [phone]    08/16/2010  In regards to   Ms. Amber Faulkner [address] [place], [place]  [address]  Please allow her to have a one week extension on the inspection of her apartment so that she may have time to prepare since she has chronic back and knee pain. If you have any further questions, please feel free to call our office.    Sincerely,   Nani Gasser MD

## 2010-09-03 ENCOUNTER — Encounter: Payer: Self-pay | Admitting: Family Medicine

## 2010-09-03 ENCOUNTER — Ambulatory Visit (INDEPENDENT_AMBULATORY_CARE_PROVIDER_SITE_OTHER): Payer: Medicare Other | Admitting: Family Medicine

## 2010-09-03 DIAGNOSIS — R0602 Shortness of breath: Secondary | ICD-10-CM

## 2010-09-03 DIAGNOSIS — R609 Edema, unspecified: Secondary | ICD-10-CM

## 2010-09-07 NOTE — Assessment & Plan Note (Addendum)
Summary: PFTs, edema   Vital Signs:  Patient profile:   50 year old female Height:      66 inches Weight:      242 pounds BMI:     39.20 Pulse rate:   73 / minute BP sitting:   126 / 69  (right arm) Cuff size:   large  Vitals Entered By: Glendell Docker CMA (September 03, 2010 11:41 AM) CC: PFT Is Patient Diabetic? Yes Pain Assessment Patient in pain? no        Primary Care Provider:  Nani Gasser MD  CC:  PFT.  History of Present Illness: Hands and feet have been swollen. Aso had a couple of episodes of face swelling this week. No CP or SOB with it. No new med. Did take a benadryl and says that didn't help at all.   Habits & Providers  Alcohol-Tobacco-Diet     Tobacco Status: current     Cigarette Packs/Day: 2.0  Allergies: 1)  ! * Cillins  Social History: Packs/Day:  2.0  Physical Exam  General:  Well-developed,well-nourished,in no acute distress; alert,appropriate and cooperative throughout examination   Impression & Recommendations:  Problem # 1:  DYSPNEA (ICD-786.05) REviewed spiromarty with her She apparently has restrictive dx so will refer for full PFTs at the hospital Consider pulmonology consult as well Will test for A1antitryptin  Orders: T-Alpha-1-Antitrypsin Tot [phone]) T-BNP  (B Natriuretic Peptide) (47829-56213) Albuterol Sulfate Sol 1mg  unit dose (Y8657) Nebulizer Tx (84696) Spirometry (Pre & Post) (29528)  Problem # 2:  EDEMA (ICD-782.3) Unclear etiology. Will check thyroid dose and blood count.   Orders: T-CBC w/Diff (41324-40102) T-Comprehensive Metabolic Panel ([phone]) T-TSH [id number]) T-Alpha-1-Antitrypsin Tot [phone]) T-BNP  (B Natriuretic Peptide) (88416-60630)  Complete Medication List: 1)  Oxycodone Hcl 30 Mg Tabs (Oxycodone hcl) .... Take up to 6 a day. 2)  Lyrica 150 Mg Caps (Pregabalin) .... Take 1 tablet by mouth two times a day 3)  Lisinopril 20 Mg Tabs (Lisinopril) .... Take one tablet by  mouth once a day 4)  Crestor 20 Mg Tabs (Rosuvastatin calcium) .... Take 1 tablet by mouth once a day 5)  Humalog 100 Unit/ml Soln (Insulin lispro (human)) .... Use per ssi 6)  Please Refit Brace Form The Left Leg  .... Dx: leg weakness and gait instability 7)  Bd Insulin Syringe 30g X 1/2" 0.5 Ml Misc (Insulin syringe-needle u-100) .... Use as directed up to 6 times per day dx:250.00 8)  Seroquel Xr 150 Mg Xr24h-tab (Quetiapine fumarate) .... Take one tablet by mouth at bedtime 9)  Adderall 10 Mg Tabs (Amphetamine-dextroamphetamine) .... Take one tablet by mouth once a day 10)  Lexapro 20 Mg Tabs (Escitalopram oxalate) .... Take 1-2 tablets by mouth once a day 11)  Amitriptyline Hcl 25 Mg Tabs (Amitriptyline hcl) .... Take 3 tablets by mouth three times a day at bedtime 12)  Metformin Hcl 500 Mg Xr24h-tab (Metformin hcl) .... Take 1 tablet by mouth once a day 13)  Promethazine Hcl 25 Mg Tabs (Promethazine hcl) .... One by mouthe every 6 hours as needed nausea.   Medication Administration  Medication # 1:    Medication: Albuterol Sulfate Sol 1mg  unit dose    Diagnosis: DYSPNEA (ICD-786.05)    Dose: 2.5    Route: inhaled    Exp Date: 10/08/2010    Lot #: [id number]    Mfr: Nephron    Patient tolerated medication without complications    Given by: Glendell Docker CMA (September 03, 2010 12:13  PM)  Orders Added: 1)  T-CBC w/Diff [[id number]] 2)  T-Comprehensive Metabolic Panel [80053-22900] 3)  T-TSH [65784-69629] 4)  T-Alpha-1-Antitrypsin Tot [82103-23820] 5)  T-BNP  (B Natriuretic Peptide) [83880-55185] 6)  Albuterol Sulfate Sol 1mg  unit dose [J7613] 7)  Nebulizer Tx [94640] 8)  Spirometry (Pre & Post) [94060] 9)  Est. Patient Level III [52841]    Current Allergies (reviewed today): ! * CILLINS   Medication Administration  Medication # 1:    Medication: Albuterol Sulfate Sol 1mg  unit dose    Diagnosis: DYSPNEA (ICD-786.05)    Dose: 2.5    Route: inhaled    Exp Date:  10/08/2010    Lot #: [id number]    Mfr: Nephron    Patient tolerated medication without complications    Given by: Glendell Docker CMA (September 03, 2010 12:13 PM)  Orders Added: 1)  T-CBC w/Diff [02725-36644] 2)  T-Comprehensive Metabolic Panel [80053-22900] 3)  T-TSH [03474-25956] 4)  T-Alpha-1-Antitrypsin Tot [82103-23820] 5)  T-BNP  (B Natriuretic Peptide) [83880-55185] 6)  Albuterol Sulfate Sol 1mg  unit dose [J7613] 7)  Nebulizer Tx [94640] 8)  Spirometry (Pre & Post) [94060] 9)  Est. Patient Level III [38756]   Appended Document: PFTs, edema   Appended Document: PFTs, edema    Past History:  Past Medical History: Spirometry 08/2010: FVC 56%, FEV1 57%, FEV1% 83 (restritive process)She did have sig improvement in FVC.    Call pt: Will send over inhaler since this did seem to help her on the test. Will need to stop teh inhaler before the PFTs we are scheduling for her at the hospital but they can let her know exactly when to stop the inhaler but can use for now. February 27, 20128:07 AM [place] MD, Santina Evans   4:28 PM September 06, 2010 McCrimmon CMA, Duncan Dull), Sue Lush phone num is temp not in service    10:01 AM McCrimmon CMA, Duncan Dull), Sue Lush September 09, 2010 pt called back and pt notified;pt wants to know since she is in the restrictive process what category does she fall in; pt does not have a phone right now so she will call me back and leave the number of a friend that I can leave the information with

## 2010-09-10 ENCOUNTER — Telehealth: Payer: Self-pay | Admitting: Family Medicine

## 2010-09-28 NOTE — Progress Notes (Signed)
Summary: Patient questions  Phone Note Call from Patient Call back at Home Phone [phone]   Caller: Patient Summary of Call: Obstructive & Restrictive catagories, pt needs to know what the catagories are for restrictive and she also wants to know if her hospital visit has been set up Initial call taken by: Lannette Donath,  September 10, 2010 9:19 AM  Follow-up for Phone Call         don't kneo if it has been scheduled or not. We can check. Sue Lush can you look on her spirometry sheet adn see if it says moderate or severe.  Follow-up by: Nani Gasser MD,  September 10, 2010 9:23 AM  Additional Follow-up for Phone Call Additional follow up Details #1::        pt phone has been d/c. was given a number to a friends phone and left a message for her to call back and have not heard back. will close encounter Additional Follow-up by: Avon Gully CMA, Duncan Dull),  September 23, 2010 12:54 PM

## 2010-10-06 ENCOUNTER — Other Ambulatory Visit: Payer: Self-pay | Admitting: Family Medicine

## 2010-10-20 ENCOUNTER — Other Ambulatory Visit: Payer: Self-pay | Admitting: Family Medicine

## 2010-11-12 ENCOUNTER — Ambulatory Visit: Payer: Medicare Other | Admitting: Family Medicine

## 2010-11-12 ENCOUNTER — Other Ambulatory Visit: Payer: Self-pay | Admitting: Family Medicine

## 2010-11-15 ENCOUNTER — Ambulatory Visit: Payer: Medicare Other | Admitting: Family Medicine

## 2010-12-09 ENCOUNTER — Other Ambulatory Visit: Payer: Self-pay | Admitting: Family Medicine

## 2010-12-16 ENCOUNTER — Telehealth: Payer: Self-pay | Admitting: Family Medicine

## 2010-12-16 NOTE — Telephone Encounter (Signed)
Pt called and said Dr.Trish Oneal Grout was ordering for her to have an MRI tomorrow, and the pt wants [place] to prescribe a pill for anxiety. Plan:  Had to [facility] that the pt needs to call Dr. Oneal Grout to order med for anxiety since she is ordering the MRI. Routed to Dr. Marlyne Beards, LPN Domingo Dimes

## 2010-12-16 NOTE — Telephone Encounter (Signed)
I agree. She should get it from MD ordering her MRI.

## 2010-12-24 ENCOUNTER — Other Ambulatory Visit: Payer: Self-pay | Admitting: Family Medicine

## 2011-01-17 ENCOUNTER — Other Ambulatory Visit: Payer: Self-pay | Admitting: Family Medicine

## 2011-01-18 ENCOUNTER — Other Ambulatory Visit: Payer: Self-pay | Admitting: Family Medicine

## 2011-01-18 NOTE — Telephone Encounter (Signed)
RX RF requested for crestor 20 mg. Pt was given script on 12-24-10 #30/1 refill.  Good til 02-23-11.  Pt will need to call our office for fasting cholesterol panel between now and 02-23-11 before other meds will be sent. Jarvis Newcomer, LPN Domingo Dimes

## 2011-02-28 ENCOUNTER — Other Ambulatory Visit: Payer: Self-pay | Admitting: Family Medicine

## 2011-04-07 ENCOUNTER — Other Ambulatory Visit: Payer: Self-pay | Admitting: Family Medicine

## 2011-05-13 ENCOUNTER — Other Ambulatory Visit: Payer: Self-pay | Admitting: Family Medicine

## 2011-06-21 ENCOUNTER — Other Ambulatory Visit: Payer: Self-pay | Admitting: Family Medicine

## 2011-07-14 ENCOUNTER — Other Ambulatory Visit: Payer: Self-pay | Admitting: Family Medicine

## 2011-07-18 DIAGNOSIS — M19019 Primary osteoarthritis, unspecified shoulder: Secondary | ICD-10-CM | POA: Diagnosis not present

## 2011-07-18 DIAGNOSIS — M5030 Other cervical disc degeneration, unspecified cervical region: Secondary | ICD-10-CM | POA: Diagnosis not present

## 2011-07-18 DIAGNOSIS — M4802 Spinal stenosis, cervical region: Secondary | ICD-10-CM | POA: Diagnosis not present

## 2011-07-18 DIAGNOSIS — M4690 Unspecified inflammatory spondylopathy, site unspecified: Secondary | ICD-10-CM | POA: Diagnosis not present

## 2011-07-28 ENCOUNTER — Other Ambulatory Visit: Payer: Self-pay | Admitting: Family Medicine

## 2011-08-02 ENCOUNTER — Encounter: Payer: Self-pay | Admitting: Physician Assistant

## 2011-08-02 ENCOUNTER — Ambulatory Visit (INDEPENDENT_AMBULATORY_CARE_PROVIDER_SITE_OTHER): Payer: Medicare Other | Admitting: Physician Assistant

## 2011-08-02 ENCOUNTER — Ambulatory Visit: Payer: Medicare Other | Admitting: Family Medicine

## 2011-08-02 VITALS — BP 118/77 | HR 73 | Wt 248.0 lb

## 2011-08-02 DIAGNOSIS — R252 Cramp and spasm: Secondary | ICD-10-CM | POA: Diagnosis not present

## 2011-08-02 DIAGNOSIS — J45909 Unspecified asthma, uncomplicated: Secondary | ICD-10-CM

## 2011-08-02 DIAGNOSIS — M79609 Pain in unspecified limb: Secondary | ICD-10-CM

## 2011-08-02 DIAGNOSIS — M79605 Pain in left leg: Secondary | ICD-10-CM

## 2011-08-02 MED ORDER — METHOCARBAMOL 500 MG PO TABS: 500.0000 mg | ORAL_TABLET | Freq: Three times a day (TID) | ORAL | Status: AC

## 2011-08-02 MED ORDER — ALBUTEROL SULFATE HFA 108 (90 BASE) MCG/ACT IN AERS: 2.0000 | INHALATION_SPRAY | Freq: Four times a day (QID) | RESPIRATORY_TRACT | Status: DC | PRN

## 2011-08-02 NOTE — Progress Notes (Signed)
  Subjective:    Patient ID: Amber Faulkner, female    DOB: 1961/05/27, 51 y.o.   MRN: [id number]  HPI Left leg pain started with in the buttocks about 1 and 1/2 months ago. She can not remember a injury. It has since started to spread down her thigh, knee, and calf. She reports some swelling of left leg. Her pain is a 7/10 and describes it as achy with intermittent shooting pain. She has had low back pain in the past but does not currently. She has tried heating pads, ice, warm baths, and advil with no relief. She takes Oxycodone about 6 per day but she says "it doesn't stop the pain". She denies bowel or urinary disfunction. No tingling or numbness of her legs.   Patient complains of muscle cramps throughout her whole body. Mostly in her sides and legs.   Patient continues to have problems with SOB but denies wheezing. She has been using albuterol inhaler about 4-6 times a week. Did not go to pulmonologist for spironmetry.     Review of Systems     Objective:   Physical Exam  Constitutional: She is oriented to person, place, and time. She appears well-developed and well-nourished.  HENT:  Head: Normocephalic and atraumatic.  Cardiovascular: Normal rate, regular rhythm and normal heart sounds.   Pulmonary/Chest:       Coarse breath sounds bilaterally. No wheezing.   Musculoskeletal:       No bony tenderness of the spine.  Not pain with palpation over the bursa of left hip. Positive straight leg test of left leg.  Not able to walk heel to toe. While trying to walk she did not want to put weight on left leg.  No muscle tenderness with palpation.  Strength 5/5 bilateral legs.  Neurological: She is alert and oriented to person, place, and time. She has normal reflexes.  Skin: Skin is warm and dry.  Psychiatric: She has a normal mood and affect. Her behavior is normal.          Assessment & Plan:  Leg pain, left- Robaxin to start. Consider warm baths with epson salt along with  massage. Follow up with [facility] for pain control. Consider making appointment with Neurologist for MS treatment.  Muscle Cramps- Ordered electrolyte panel. Will call with results. Stay hydrated.   Asthma- Refilled albuterol inhaler. Referred to pulmonologist for spirometry.

## 2011-08-02 NOTE — Patient Instructions (Addendum)
Make appointment with Neurologist to have treat for MS. [facility]. Start Robaxin. Sent referral to Pulmonlogist for spirometry. Refilled Albuterol. Get Electrolyte panel done. Will call with results.

## 2011-08-18 ENCOUNTER — Encounter: Payer: Self-pay | Admitting: Physician Assistant

## 2011-08-22 ENCOUNTER — Institutional Professional Consult (permissible substitution): Payer: Medicare Other | Admitting: Critical Care Medicine

## 2011-09-12 DIAGNOSIS — M4690 Unspecified inflammatory spondylopathy, site unspecified: Secondary | ICD-10-CM | POA: Diagnosis not present

## 2011-09-12 DIAGNOSIS — M19019 Primary osteoarthritis, unspecified shoulder: Secondary | ICD-10-CM | POA: Diagnosis not present

## 2011-09-12 DIAGNOSIS — M5030 Other cervical disc degeneration, unspecified cervical region: Secondary | ICD-10-CM | POA: Diagnosis not present

## 2011-09-12 DIAGNOSIS — Z79899 Other long term (current) drug therapy: Secondary | ICD-10-CM | POA: Diagnosis not present

## 2011-09-12 DIAGNOSIS — M4802 Spinal stenosis, cervical region: Secondary | ICD-10-CM | POA: Diagnosis not present

## 2011-09-16 ENCOUNTER — Other Ambulatory Visit: Payer: Self-pay | Admitting: Family Medicine

## 2011-09-19 ENCOUNTER — Other Ambulatory Visit: Payer: Self-pay | Admitting: *Deleted

## 2011-09-22 ENCOUNTER — Ambulatory Visit: Payer: Medicare Other | Admitting: Family Medicine

## 2011-09-22 ENCOUNTER — Other Ambulatory Visit: Payer: Self-pay | Admitting: *Deleted

## 2011-09-22 MED ORDER — METHOCARBAMOL 500 MG PO TABS: 500.0000 mg | ORAL_TABLET | Freq: Three times a day (TID) | ORAL | Status: DC

## 2011-09-27 ENCOUNTER — Ambulatory Visit (INDEPENDENT_AMBULATORY_CARE_PROVIDER_SITE_OTHER): Payer: Medicare Other | Admitting: Family Medicine

## 2011-09-27 ENCOUNTER — Encounter: Payer: Self-pay | Admitting: Family Medicine

## 2011-09-27 DIAGNOSIS — E785 Hyperlipidemia, unspecified: Secondary | ICD-10-CM | POA: Diagnosis not present

## 2011-09-27 DIAGNOSIS — M79609 Pain in unspecified limb: Secondary | ICD-10-CM

## 2011-09-27 DIAGNOSIS — R252 Cramp and spasm: Secondary | ICD-10-CM | POA: Diagnosis not present

## 2011-09-27 DIAGNOSIS — E119 Type 2 diabetes mellitus without complications: Secondary | ICD-10-CM | POA: Diagnosis not present

## 2011-09-27 DIAGNOSIS — J984 Other disorders of lung: Secondary | ICD-10-CM

## 2011-09-27 DIAGNOSIS — M79606 Pain in leg, unspecified: Secondary | ICD-10-CM

## 2011-09-27 LAB — POCT UA - MICROALBUMIN

## 2011-09-27 MED ORDER — CARISOPRODOL 250 MG PO TABS: 250.0000 mg | ORAL_TABLET | Freq: Three times a day (TID) | ORAL | Status: DC | PRN

## 2011-09-27 NOTE — Progress Notes (Signed)
Addended by: Wyline Beady on: 09/27/2011 04:57 PM   Modules accepted: Orders

## 2011-09-27 NOTE — Progress Notes (Signed)
Subjective:    Patient ID: Amber Faulkner, female    DOB: 08-Nov-1960, 51 y.o.   MRN: [id number]  Diabetes She presents for her follow-up diabetic visit. She has type 2 diabetes mellitus. Her disease course has been stable. There are no hypoglycemic associated symptoms. Pertinent negatives for diabetes include no blurred vision, no chest pain, no polydipsia, no polyphagia and no polyuria. Symptoms are stable. She is compliant with treatment all of the time. She is following a generally healthy diet. She rarely participates in exercise. (No checking sugars.) An ACE inhibitor/angiotensin II receptor blocker is being taken.   Left leg pain - Says the robaxin didn't work.   Hands have been drawing up and cramping.  Her pain MD told her to f/u with her Neurologist but can't get in until May.  Thoght she has also had cramping in her arms. She says she also thinks her eye looked jaundiced last month. She says she has a hx of liver problems. She does take a statin regularly. Has not had problems with it before.     Review of Systems  Eyes: Negative for blurred vision.  Cardiovascular: Negative for chest pain.  Genitourinary: Negative for polyuria.  Hematological: Negative for polydipsia and polyphagia.   BP 121/75  Pulse 95  Ht 5' 5.5" (1.664 m)  Wt 240 lb (108.863 kg)  BMI 39.33 kg/m2    Allergies  Allergen Reactions  . Penicillins Hives    No past medical history on file.  No past surgical history on file.  History   Social History  . Marital Status: Single    Spouse Name: N/A    Number of Children: N/A  . Years of Education: N/A   Occupational History  . Not on file.   Social History Main Topics  . Smoking status: Current Everyday Smoker    Types: Cigarettes  . Smokeless tobacco: Not on file  . Alcohol Use: Not on file  . Drug Use: Not on file  . Sexually Active: Not on file   Other Topics Concern  . Not on file   Social History Narrative  . No narrative on file     No family history on file.       Objective:   Physical Exam  Constitutional: She is oriented to person, place, and time. She appears well-developed and well-nourished.  HENT:  Head: Normocephalic and atraumatic.  Cardiovascular: Normal rate, regular rhythm and normal heart sounds.   Pulmonary/Chest: Effort normal and breath sounds normal.  Musculoskeletal:       Left leg with 4/5 hip flexion, knee and ankle is 5/5 bilaterally.  Patellar reflexes 2+ bilaterally.   Neurological: She is alert and oriented to person, place, and time.  Skin: Skin is warm and dry.  Psychiatric: She has a normal mood and affect. Her behavior is normal.          Assessment & Plan:  Left Leg pain - She would like to try another muscle relaxer. Says soma has worked well for her in the past.  Will fill for 60 tabs to be used up to TID.  She has an appointment scheduled with her neurologist in May. That was the first available.  Muscle cramping-we will also check a CK, liver enzymes and sedimentation rate. She does take a statin and has not had any blood work in the last year.I want to make sure not a SE of the medication.   Hyperlipidemia-to recheck her liver and repeat lipid levels.  She says she is taking her medication regularly without any significant side effects.  MS - Has appt in May.    DM- Recommend yearly eye exam and daily baby ASA. Will check urine micro today. She is at goal on her A1C, though up some from last time. F/U in 3 months. Continue metformin.  Due for monofilament at next OV.   Lab Results  Component Value Date   HGBA1C 6.8 05/20/2010     Despondent her in a year ago and she had restrictive disease. Does she need full PFTs to the hospital. We tried to set her up and unfortunately did not have a working phone number so we were never able to reach her. She says the phone numbner is correct but she sometimes can't afford to buy more minutes.  We will try to reschedule this.

## 2011-09-27 NOTE — Patient Instructions (Signed)
Remember to get your yearly eye exam.  

## 2011-10-03 ENCOUNTER — Telehealth: Payer: Self-pay | Admitting: *Deleted

## 2011-10-03 NOTE — Telephone Encounter (Signed)
Pt called and states her insurance will not pay for 250mg  of soma but will pay for 350mg  of soma. Needs a new rx if ok to fill

## 2011-10-03 NOTE — Telephone Encounter (Signed)
Can you call the pharm to verify. If that is correct then ok to change to 350.

## 2011-10-05 ENCOUNTER — Other Ambulatory Visit: Payer: Self-pay | Admitting: *Deleted

## 2011-10-05 MED ORDER — CARISOPRODOL 350 MG PO TABS: 350.0000 mg | ORAL_TABLET | Freq: Three times a day (TID) | ORAL | Status: DC | PRN

## 2011-10-14 ENCOUNTER — Other Ambulatory Visit: Payer: Self-pay | Admitting: Family Medicine

## 2011-11-07 ENCOUNTER — Other Ambulatory Visit: Payer: Self-pay | Admitting: Family Medicine

## 2011-11-11 ENCOUNTER — Other Ambulatory Visit: Payer: Self-pay | Admitting: Family Medicine

## 2011-11-12 ENCOUNTER — Other Ambulatory Visit: Payer: Self-pay | Admitting: Family Medicine

## 2011-11-14 ENCOUNTER — Other Ambulatory Visit: Payer: Self-pay | Admitting: Family Medicine

## 2011-11-30 ENCOUNTER — Ambulatory Visit (INDEPENDENT_AMBULATORY_CARE_PROVIDER_SITE_OTHER): Payer: Medicare Other | Admitting: Family Medicine

## 2011-11-30 ENCOUNTER — Ambulatory Visit (HOSPITAL_BASED_OUTPATIENT_CLINIC_OR_DEPARTMENT_OTHER)
Admission: RE | Admit: 2011-11-30 | Discharge: 2011-11-30 | Disposition: A | Discharge status: Home / Self Care | Payer: Medicare Other | Source: Ambulatory Visit | Attending: Family Medicine | Admitting: Family Medicine

## 2011-11-30 ENCOUNTER — Other Ambulatory Visit: Payer: Self-pay | Admitting: Family Medicine

## 2011-11-30 ENCOUNTER — Encounter: Payer: Self-pay | Admitting: Family Medicine

## 2011-11-30 VITALS — BP 106/70 | HR 97 | Ht 65.5 in | Wt 235.0 lb

## 2011-11-30 DIAGNOSIS — M79609 Pain in unspecified limb: Secondary | ICD-10-CM

## 2011-11-30 DIAGNOSIS — F39 Unspecified mood [affective] disorder: Secondary | ICD-10-CM

## 2011-11-30 DIAGNOSIS — E785 Hyperlipidemia, unspecified: Secondary | ICD-10-CM | POA: Diagnosis not present

## 2011-11-30 DIAGNOSIS — M7989 Other specified soft tissue disorders: Secondary | ICD-10-CM | POA: Diagnosis not present

## 2011-11-30 DIAGNOSIS — E119 Type 2 diabetes mellitus without complications: Secondary | ICD-10-CM | POA: Diagnosis not present

## 2011-11-30 DIAGNOSIS — M25569 Pain in unspecified knee: Secondary | ICD-10-CM | POA: Diagnosis not present

## 2011-11-30 DIAGNOSIS — R252 Cramp and spasm: Secondary | ICD-10-CM | POA: Diagnosis not present

## 2011-11-30 DIAGNOSIS — M79605 Pain in left leg: Secondary | ICD-10-CM

## 2011-11-30 LAB — CK: Total CK: 48 U/L (ref 7–177)

## 2011-11-30 LAB — LIPID PANEL
Cholesterol: 134 mg/dL (ref 0–200)
HDL: 52 mg/dL (ref >39)
LDL Cholesterol: 59 mg/dL (ref 0–99)
Total CHOL/HDL Ratio: 2.6 Ratio
Triglycerides: 113 mg/dL (ref <150)
VLDL: 23 mg/dL (ref 0–40)

## 2011-11-30 LAB — COMPLETE METABOLIC PANEL WITH GFR
ALT: 12 U/L (ref 0–35)
AST: 15 U/L (ref 0–37)
Alkaline Phosphatase: 108 U/L (ref 39–117)
BUN: 14 mg/dL (ref 6–23)
Calcium: 9.6 mg/dL (ref 8.4–10.5)
Creat: 0.84 mg/dL (ref 0.50–1.10)
Total Bilirubin: 0.6 mg/dL (ref 0.3–1.2)

## 2011-11-30 MED ORDER — QUETIAPINE FUMARATE ER 300 MG PO TB24: 300.0000 mg | ORAL_TABLET | Freq: Every day | ORAL | Status: DC

## 2011-11-30 MED ORDER — METAXALONE 800 MG PO TABS: 800.0000 mg | ORAL_TABLET | Freq: Two times a day (BID) | ORAL | Status: AC | PRN

## 2011-11-30 NOTE — Progress Notes (Signed)
  Subjective:    Patient ID: Amber Faulkner, female    DOB: [birth year], 51 y.o.   MRN: [id number]  HPI Left leg pain x 2 weeks. She iw sorried about a blood clot.  Says has had icey water sensation.  Pain is moslty on the lateral calf.  Hx of OA in the left knee.  Has some bone aching pain as well.  Leg has been swollen in the calf and thight.  Says has looked red to her at times. Has used bengay, heating pad, ice, etc. No sig relief.   Knee pain for years. Says even just slight movement will cause pain.  MRI of knee 8 years ago showed OA.  Had xrays a couple of years ago.  No prior surgery. Has had injection.  KNee swelling intermittantly. Pain worse with walking.  Knee will give out suddenlyt and she will fall.  Fell 3 x yesterday.    Mood d/o  - she has actually been taking 2 seroquel 150mg  and says it works better for her. She would like to increase her dose so doesn't run out so early.    Chronic low back pain-she says her insurance will no longer cover soma. She's tried multiple muscle relaxers in the past and soma was the first one that she fell she got benefit from. She said that the insurance coverage for 2 months but this last time she says it was denied. I asked her if it was it was too early to fill it and she said no it was on time but the insurance declined to cover it. She says she has not ever tried Surveyor, quantity.  Review of Systems     Objective:   Physical Exam  Constitutional: She appears well-developed and well-nourished.  HENT:  Head: Normocephalic and atraumatic.  Musculoskeletal:       No rashes or redness of either leg. 41 cm around each calf.  Left leg does look larger.  Trace edema in both ankles.  DP pulse 2+ on the left foot. She is mildly tender with calf squeeze. Normal range of motion of the knee and ankle. Some trace edema of the left knee.  Skin: Skin is warm and dry.  Psychiatric: She has a normal mood and affect. Her behavior is normal.          Assessment &  Plan:  Leg pain - Will send for [place] for further evaluation to rule out DVT.  If this is negative then consider a referral to orthopedist for her knee pain. Also consider that some of her leg discomfort may be coming from her chronic low back pain.  KNee pain - Will refer to ortho for further evaluation esp since giving out. Hx of OA. xrays are old.    Mood disorder - Will inc her seroquel to 300mg  XR. Will need to monitor glucose.  F/U in 3 months.   Chronic low back pain - Will try the skelaxin and see if insurance covers it or not. Or may have to pay out of pocket for the soma, since it does work for her.

## 2011-11-30 NOTE — Patient Instructions (Signed)
We will get you in with ortho.

## 2011-12-01 LAB — CBC WITH DIFFERENTIAL/PLATELET
Basophils Absolute: 0.1 10*3/uL (ref 0.0–0.1)
Basophils Relative: 1 % (ref 0–1)
Eosinophils Absolute: 0.1 10*3/uL (ref 0.0–0.7)
Eosinophils Relative: 1 % (ref 0–5)
MCH: 29.1 pg (ref 26.0–34.0)
MCHC: 33.4 g/dL (ref 30.0–36.0)
MCV: 87.1 fL (ref 78.0–100.0)
RDW: 15.2 % (ref 11.5–15.5)

## 2011-12-22 ENCOUNTER — Encounter: Payer: Self-pay | Admitting: *Deleted

## 2011-12-26 ENCOUNTER — Other Ambulatory Visit: Payer: Self-pay | Admitting: Family Medicine

## 2011-12-30 ENCOUNTER — Encounter: Payer: Self-pay | Admitting: Family Medicine

## 2011-12-30 ENCOUNTER — Ambulatory Visit (INDEPENDENT_AMBULATORY_CARE_PROVIDER_SITE_OTHER): Payer: Medicare Other | Admitting: Family Medicine

## 2011-12-30 VITALS — BP 125/77 | HR 101 | Wt 243.0 lb

## 2011-12-30 DIAGNOSIS — E119 Type 2 diabetes mellitus without complications: Secondary | ICD-10-CM

## 2011-12-30 DIAGNOSIS — R635 Abnormal weight gain: Secondary | ICD-10-CM | POA: Diagnosis not present

## 2011-12-30 DIAGNOSIS — R1032 Left lower quadrant pain: Secondary | ICD-10-CM

## 2011-12-30 DIAGNOSIS — G589 Mononeuropathy, unspecified: Secondary | ICD-10-CM

## 2011-12-30 DIAGNOSIS — M79673 Pain in unspecified foot: Secondary | ICD-10-CM

## 2011-12-30 DIAGNOSIS — M79609 Pain in unspecified limb: Secondary | ICD-10-CM

## 2011-12-30 DIAGNOSIS — G629 Polyneuropathy, unspecified: Secondary | ICD-10-CM | POA: Insufficient documentation

## 2011-12-30 NOTE — Progress Notes (Signed)
Subjective:    Patient ID: Amber Faulkner, female    DOB: 11/23/1960, 51 y.o.   MRN: [id number]  Diabetes She presents for her follow-up diabetic visit. She has type 2 diabetes mellitus. Her disease course has been stable. There are no hypoglycemic associated symptoms. Associated symptoms include foot paresthesias and polydipsia. Pertinent negatives for diabetes include no polyphagia, no polyuria and no visual change. There are no hypoglycemic complications. Symptoms are stable. She is compliant with treatment all of the time. She is following a generally healthy diet. Her breakfast blood glucose range is generally 180-200 mg/dl. An ACE inhibitor/angiotensin II receptor blocker is being taken. Eye exam is not current.  Still walking for exercise. Says her acitivty level and diet haven't change but is gaining weight  Says thinks has a hernia . WAs coughing one night and had a sudden pain in the location of old hernia repair site ( had repair at age 56). Was painful to walk afterwards.  Started burning the next day. It has gotten slightly better but still tender to touch. She has not noticed any bulging or swelling. No change in bowels.     Review of Systems  Genitourinary: Negative for polyuria.  Hematological: Positive for polydipsia. Negative for polyphagia.       Objective:   Physical Exam  Constitutional: She is oriented to person, place, and time. She appears well-developed and well-nourished.  HENT:  Head: Normocephalic and atraumatic.  Cardiovascular: Normal rate, regular rhythm and normal heart sounds.   Pulmonary/Chest: Effort normal and breath sounds normal.  Abdominal: Soft. Bowel sounds are normal. She exhibits no distension and no mass. There is tenderness. There is no rebound and no guarding.       Very tender in the LLQ.   Neurological: She is alert and oriented to person, place, and time.  Skin: Skin is warm and dry.  Psychiatric: She has a normal mood and affect. Her  behavior is normal.          Assessment & Plan:  DM - Well controlled. A1C is 6.6 today.  Monofilament exam performed today. I encouraged with her Medicaid should pay for at least one eye exam a year. She said she tried a Training and development officer couple times and has not gotten a response. I encouraged her to call 100 number on the back of her card and actually talk to someone he is in the benefits part of the program. Continue to work on exercise and diet. Next  Abnormal weight gain-she says she's exercising the same and eating the same diet. She's not sure why she is gaining weight. She doesn't necessarily feel swollen. We will check a TSH.  ABnrmal spirometry. She is ready to  Schedule the PFTS rec in March. He says her knees has been on her to try to get this further evaluated. She is a very heavy breather get short of breath at times.  She still has a lump in her foot. Not gone away. Still tender. Will refer to Podiatry.   Neuropathy - she has decreased sensation on monofilament exam today. We will check for B12, folate, iron deficiency as well as thyroid abnormalities to rule out other causes of neuropathy. This likely secondary to her diabetes.She is already on lyrica.   Left lower quadrant pain-am not sure if this is a reinjury to the actual hernia site or not. I was unable to palpate any bulge or defect in the abdominal wall. Certainly she could have torn some scar  tissue. Asked her to give it another 2-3 weeks to see if it improves. If not then please let me know and consider further evaluation with CT to evaluate for hernia.

## 2011-12-30 NOTE — Patient Instructions (Addendum)
Call me in 2 weeks if incision is not feeling better.  Try to get your eye exam.

## 2012-01-03 ENCOUNTER — Telehealth: Payer: Self-pay | Admitting: *Deleted

## 2012-01-03 NOTE — Telephone Encounter (Signed)
Tried to contact pt on 01/02/12 @ 11:20am. LM for pt to return call. Also tried to contact pt this am and unable to contact. LM.

## 2012-01-04 ENCOUNTER — Telehealth: Payer: Self-pay | Admitting: *Deleted

## 2012-01-04 NOTE — Telephone Encounter (Signed)
Letter mailed to pt for her to give [place] a call so she can have lab work done per Dr. Shelah Lewandowsky request. Tried to contact pt and unable to get a return call.

## 2012-01-05 ENCOUNTER — Other Ambulatory Visit: Payer: Self-pay | Admitting: Family Medicine

## 2012-01-05 DIAGNOSIS — M47817 Spondylosis without myelopathy or radiculopathy, lumbosacral region: Secondary | ICD-10-CM | POA: Diagnosis not present

## 2012-01-05 DIAGNOSIS — M5380 Other specified dorsopathies, site unspecified: Secondary | ICD-10-CM | POA: Diagnosis not present

## 2012-01-05 DIAGNOSIS — M5137 Other intervertebral disc degeneration, lumbosacral region: Secondary | ICD-10-CM | POA: Diagnosis not present

## 2012-01-06 ENCOUNTER — Other Ambulatory Visit: Payer: Self-pay | Admitting: *Deleted

## 2012-01-06 MED ORDER — CARISOPRODOL 350 MG PO TABS: 350.0000 mg | ORAL_TABLET | Freq: Three times a day (TID) | ORAL | Status: DC | PRN

## 2012-01-19 DIAGNOSIS — IMO0002 Reserved for concepts with insufficient information to code with codable children: Secondary | ICD-10-CM | POA: Diagnosis not present

## 2012-01-19 DIAGNOSIS — M722 Plantar fascial fibromatosis: Secondary | ICD-10-CM | POA: Diagnosis not present

## 2012-01-19 DIAGNOSIS — M216X9 Other acquired deformities of unspecified foot: Secondary | ICD-10-CM | POA: Diagnosis not present

## 2012-01-19 DIAGNOSIS — M7750 Other enthesopathy of unspecified foot: Secondary | ICD-10-CM | POA: Diagnosis not present

## 2012-01-23 ENCOUNTER — Other Ambulatory Visit: Payer: Self-pay | Admitting: Family Medicine

## 2012-01-23 ENCOUNTER — Other Ambulatory Visit: Payer: Self-pay | Admitting: Physician Assistant

## 2012-01-27 ENCOUNTER — Other Ambulatory Visit: Payer: Self-pay | Admitting: *Deleted

## 2012-01-27 NOTE — Telephone Encounter (Signed)
Too soon. Will fill on 7/28

## 2012-01-27 NOTE — Telephone Encounter (Signed)
Pt is requesting refill on soma. It was was filled last on 01/06/12 for 60 tabs. Please advise if we can refill.

## 2012-01-27 NOTE — Telephone Encounter (Signed)
Pt informed to call on the 26th to get refill.

## 2012-02-06 ENCOUNTER — Other Ambulatory Visit: Payer: Self-pay | Admitting: *Deleted

## 2012-02-06 MED ORDER — CARISOPRODOL 350 MG PO TABS: 350.0000 mg | ORAL_TABLET | Freq: Three times a day (TID) | ORAL | Status: DC | PRN

## 2012-02-15 ENCOUNTER — Ambulatory Visit (INDEPENDENT_AMBULATORY_CARE_PROVIDER_SITE_OTHER): Payer: Medicare Other | Admitting: Family Medicine

## 2012-02-15 ENCOUNTER — Encounter: Payer: Self-pay | Admitting: Family Medicine

## 2012-02-15 VITALS — BP 158/89 | HR 95 | Temp 99.9°F | Resp 18 | Wt 249.0 lb

## 2012-02-15 DIAGNOSIS — K047 Periapical abscess without sinus: Secondary | ICD-10-CM

## 2012-02-15 DIAGNOSIS — K044 Acute apical periodontitis of pulpal origin: Secondary | ICD-10-CM

## 2012-02-15 MED ORDER — CLINDAMYCIN HCL 300 MG PO CAPS: 600.0000 mg | ORAL_CAPSULE | Freq: Three times a day (TID) | ORAL | Status: AC

## 2012-02-15 MED ORDER — OXYCODONE-ACETAMINOPHEN 7.5-325 MG PO TABS: 1.0000 | ORAL_TABLET | Freq: Four times a day (QID) | ORAL | Status: AC | PRN

## 2012-02-15 NOTE — Progress Notes (Addendum)
CC: Amber Faulkner is a 51 y.o. female is here for Facial Swelling   Subjective: HPI: 4 days ago woke up with left lower jaw swelling that is externally more than internally. Evolved into left lower jaw pain in her posterior inferior tooth when chewing or leaning forward. 2 days ago noticed swelling in the left neck and swollen lymph nodes on the left side. Local this morning with a sudden worsening of the pain and swelling externally. Also is experiencing mild fevers and chills with a mild nausea sensation. Able to keep down fluids and his thirsting but any chewing causes discomfort. No recent trauma but admits to poor dentition.    Review Of Systems Outlined In HPI  Past Medical History  Diagnosis Date  . Diabetes mellitus   . ADD (attention deficit disorder)   . Hyperlipidemia   . HIV infection   . Neuromuscular disorder   . Chronic pain   . Anxiety      Family History  Problem Relation Age of Onset  . Cancer Mother     brain and lung  . COPD Mother   . Hyperlipidemia Father   . Hypertension Father   . Diabetes Father   . Heart disease Father   . Cancer Sister     hepatic and pancreatic  . Cancer Maternal Aunt     breast     History  Substance Use Topics  . Smoking status: Current Everyday Smoker    Types: Cigarettes  . Smokeless tobacco: Not on file  . Alcohol Use: No     Objective: Filed Vitals:   02/15/12 1552  BP:   Pulse:   Temp: 99.9 F (37.7 C)  Resp:     General: Alert and Oriented, No Acute Distress, fatigued HEENT: Pupils equal, round, reactive to light. Conjunctivae clear.  Pink inferior turbinates.  Moist mucous membranes, pharynx without inflammation nor lesions.  Moderate anterior chain lymphadenopathy L>R. L parotid gland non-tender.  Left inferior posterior molar with chipping and exquisitely tender to the touch.  No discharge at base of tooth. Moderate soft tissue swelling just anterior to the angle of the mandible without nodularity or  fluctuance. Lungs: Clear to auscultation bilaterally, no wheezing/ronchi/rales.  Comfortable work of breathing. Good air movement. Cardiac: Regular rate and rhythm. Normal S1/S2.  No murmurs, rubs, nor gallops.   Mental Status: No depression, anxiety, nor agitation. Skin: Warm and dry.  Assessment & Plan: Amber Faulkner was seen today for facial swelling.  Diagnoses and associated orders for this visit:  Dental infection - clindamycin (CLEOCIN) 300 MG capsule; Take 2 capsules (600 mg total) by mouth 3 (three) times daily. - oxyCODONE-acetaminophen (PERCOCET) 7.5-325 MG per tablet; Take 1 tablet by mouth every 6 (six) hours as needed for pain.    Patient appears to have a dental infection and is now extending into the soft tissue space however do not think there is an abscess that needs draining at this time. She is allergy to penicillins my research shows that clindamycin is the next best agent. Discussed side effects of clindamycin most notably loose stools versus the risks of worsening infection. Patient will come in on Friday if not improving will discuss other antibiotic therapy she agrees to go to emergency room for emergent evaluation of worsening. She'll be calling a dental office after this visit noted to get this as soon as possible. She sees a pain management clinic however, Given the degree of swelling I feel would be unreasonable to give her  Percocet until the infection gets under control.  Return Return Friday if not improving, go to ED if fevers or pain worsens despite antibiotics..  Requested Prescriptions   Signed Prescriptions Disp Refills  . clindamycin (CLEOCIN) 300 MG capsule 60 capsule 0    Sig: Take 2 capsules (600 mg total) by mouth 3 (three) times daily.  Marland Kitchen oxyCODONE-acetaminophen (PERCOCET) 7.5-325 MG per tablet 20 tablet 0    Sig: Take 1 tablet by mouth every 6 (six) hours as needed for pain.

## 2012-02-15 NOTE — Patient Instructions (Signed)
Abscessed Tooth A tooth abscess is a collection of infected fluid (pus) from a bacterial infection in the inner part of the tooth (pulp). It usually occurs at the end of the tooth's root.  CAUSES   A very bad cavity (extensive tooth decay).   Trauma to the tooth, such as a broken or chipped tooth, that allows bacteria to enter into the pulp.  SYMPTOMS  Severe pain in and around the infected tooth.   Swelling and redness around the abscessed tooth or in the mouth or face.   Tenderness.   Pus drainage.   Bad breath.   Bitter taste in the mouth.   Difficulty swallowing.   Difficulty opening the mouth.   Feeling sick to your stomach (nauseous).   Vomiting.   Chills.   Swollen neck glands.  DIAGNOSIS  A medical and dental history will be taken.   An examination will be performed by tapping on the abscessed tooth.   X-rays may be taken of the tooth to identify the abscess.  TREATMENT The goal of treatment is to eliminate the infection.   You may be prescribed antibiotic medicine to stop the infection from spreading.   A root canal may be performed to save the tooth. If the tooth cannot be saved, it may be pulled (extracted) and the abscess may be drained.  HOME CARE INSTRUCTIONS  Only take over-the-counter or prescription medicines for pain, fever, or discomfort as directed by your caregiver.   Do not drive after taking pain medicine (narcotics).   Rinse your mouth (gargle) often with salt water ( tsp salt in 8 oz of warm water) to relieve pain or swelling.   Do not apply heat to the outside of your face.   Return to your dentist for further treatment as directed.  SEEK IMMEDIATE DENTAL CARE IF:  You have a temperature by mouth above 102 F (38.9 C), not controlled by medicine.   You have chills or a very bad headache.   You have problems breathing or swallowing.   Your have trouble opening your mouth.   You develop swelling in the neck or around the eye.    Your pain is not helped by medicine.   Your pain is getting worse instead of better.  Document Released: 06/27/2005 Document Revised: 06/16/2011 Document Reviewed: 10/05/2010 [employer] Patient Information 2012 [employer]. 

## 2012-02-17 DIAGNOSIS — M329 Systemic lupus erythematosus, unspecified: Secondary | ICD-10-CM | POA: Diagnosis not present

## 2012-02-17 DIAGNOSIS — IMO0001 Reserved for inherently not codable concepts without codable children: Secondary | ICD-10-CM | POA: Diagnosis not present

## 2012-02-17 DIAGNOSIS — K089 Disorder of teeth and supporting structures, unspecified: Secondary | ICD-10-CM | POA: Diagnosis not present

## 2012-02-17 DIAGNOSIS — Z79899 Other long term (current) drug therapy: Secondary | ICD-10-CM | POA: Diagnosis not present

## 2012-02-17 DIAGNOSIS — K029 Dental caries, unspecified: Secondary | ICD-10-CM | POA: Diagnosis not present

## 2012-02-17 DIAGNOSIS — E119 Type 2 diabetes mellitus without complications: Secondary | ICD-10-CM | POA: Diagnosis not present

## 2012-02-17 DIAGNOSIS — G8929 Other chronic pain: Secondary | ICD-10-CM | POA: Diagnosis not present

## 2012-02-17 DIAGNOSIS — F17200 Nicotine dependence, unspecified, uncomplicated: Secondary | ICD-10-CM | POA: Diagnosis not present

## 2012-02-17 DIAGNOSIS — G35 Multiple sclerosis: Secondary | ICD-10-CM | POA: Diagnosis not present

## 2012-02-17 DIAGNOSIS — Z7982 Long term (current) use of aspirin: Secondary | ICD-10-CM | POA: Diagnosis not present

## 2012-02-17 DIAGNOSIS — Z881 Allergy status to other antibiotic agents status: Secondary | ICD-10-CM | POA: Diagnosis not present

## 2012-02-17 DIAGNOSIS — Z880 Allergy status to penicillin: Secondary | ICD-10-CM | POA: Diagnosis not present

## 2012-02-28 ENCOUNTER — Other Ambulatory Visit: Payer: Self-pay | Admitting: Physician Assistant

## 2012-02-28 ENCOUNTER — Other Ambulatory Visit: Payer: Self-pay | Admitting: Family Medicine

## 2012-03-06 ENCOUNTER — Other Ambulatory Visit: Payer: Self-pay | Admitting: *Deleted

## 2012-03-06 MED ORDER — CARISOPRODOL 350 MG PO TABS: 350.0000 mg | ORAL_TABLET | Freq: Three times a day (TID) | ORAL | Status: DC | PRN

## 2012-03-29 ENCOUNTER — Other Ambulatory Visit: Payer: Self-pay | Admitting: Family Medicine

## 2012-03-29 ENCOUNTER — Other Ambulatory Visit: Payer: Self-pay | Admitting: *Deleted

## 2012-03-29 ENCOUNTER — Ambulatory Visit: Payer: Medicare Other | Admitting: Family Medicine

## 2012-03-29 MED ORDER — METFORMIN HCL ER 500 MG PO TB24: 500.0000 mg | ORAL_TABLET | ORAL | Status: DC

## 2012-03-29 NOTE — Addendum Note (Signed)
Addended by: Judie Petit A on: 03/29/2012 04:48 PM   Modules accepted: Orders

## 2012-04-23 ENCOUNTER — Ambulatory Visit (INDEPENDENT_AMBULATORY_CARE_PROVIDER_SITE_OTHER): Payer: Medicare Other | Admitting: Family Medicine

## 2012-04-23 ENCOUNTER — Encounter: Payer: Self-pay | Admitting: Family Medicine

## 2012-04-23 VITALS — BP 124/86 | HR 84 | Ht 65.0 in | Wt 241.0 lb

## 2012-04-23 DIAGNOSIS — E119 Type 2 diabetes mellitus without complications: Secondary | ICD-10-CM | POA: Diagnosis not present

## 2012-04-23 DIAGNOSIS — R112 Nausea with vomiting, unspecified: Secondary | ICD-10-CM

## 2012-04-23 DIAGNOSIS — E559 Vitamin D deficiency, unspecified: Secondary | ICD-10-CM | POA: Diagnosis not present

## 2012-04-23 DIAGNOSIS — G609 Hereditary and idiopathic neuropathy, unspecified: Secondary | ICD-10-CM | POA: Diagnosis not present

## 2012-04-23 DIAGNOSIS — R1013 Epigastric pain: Secondary | ICD-10-CM

## 2012-04-23 DIAGNOSIS — M79609 Pain in unspecified limb: Secondary | ICD-10-CM

## 2012-04-23 DIAGNOSIS — M79606 Pain in leg, unspecified: Secondary | ICD-10-CM

## 2012-04-23 DIAGNOSIS — G35 Multiple sclerosis: Secondary | ICD-10-CM

## 2012-04-23 DIAGNOSIS — G629 Polyneuropathy, unspecified: Secondary | ICD-10-CM

## 2012-04-23 LAB — POCT GLYCOSYLATED HEMOGLOBIN (HGB A1C): Hemoglobin A1C: 7.4

## 2012-04-23 MED ORDER — METFORMIN HCL ER (OSM) 1000 MG PO TB24: 500.0000 mg | ORAL_TABLET | Freq: Every day | ORAL | Status: DC

## 2012-04-23 NOTE — Progress Notes (Signed)
Subjective:    Patient ID: Amber Faulkner, female    DOB: 03-Oct-1960, 51 y.o.   MRN: [id number]  HPI Started feeling bad x 12 days.  N/V x 4 days. + Night sweats. No fever. Then next 4 days was able to eat a few saltines.  Would feel like her "vomit" would get stuck in the epigastrum and then would feel SOB.  Was vomiting white foam.   Still feels nausetaed. Able to keep fluids down. The lat 3-4 days stomach has been hurting really bad and shooting through her back last couple of days.  Some stomach burning but TUMS didn't help. Mom and MGM both had pancreatitis. No heartburn or reflux symptoms. No alleviating symptoms. No worsening symptoms except for eating food.  Cyst on her scalp and has ben pushing and pushing on it. No oozing but was able to move it.  Then started bleeding and has a scab.     She still complains of aching in her thighs. She says it's not really a muscle cramping like she sometimes concern her feet. She does have a diagnosis of peripheral neuropathy from her diabetes. She says sometimes she will try to soak in warm bath and that does seem to help a little bit. We had previously set her up for Dopplers to rule out DVT and it was normal. No trauma. It happens intermittently. Review of Systems  BP 124/86  Pulse 84  Ht 5\' 5"  (1.651 m)  Wt 241 lb (109.317 kg)  BMI 40.10 kg/m2    Allergies  Allergen Reactions  . Penicillins Hives    Past Medical History  Diagnosis Date  . Diabetes mellitus   . ADD (attention deficit disorder)   . Hyperlipidemia   . HIV infection   . Neuromuscular disorder   . Chronic pain   . Anxiety     Past Surgical History  Procedure Date  . Cesarean section 1980  . Hernia repair 1976  . Abdominal hysterectomy 1991    complete  . Carpal tunnel release 1993-1994    both wrist  . Tarsal tunnel release 1993    left ankle  . Tumor removal 1993    left thigh    History   Social History  . Marital Status: Single    Spouse Name: N/A   Number of Children: N/A  . Years of Education: N/A   Occupational History  . Not on file.   Social History Main Topics  . Smoking status: Current Every Day Smoker -- 1.5 packs/day    Types: Cigarettes  . Smokeless tobacco: Not on file  . Alcohol Use: No  . Drug Use: No  . Sexually Active: Not on file   Other Topics Concern  . Not on file   Social History Narrative  . No narrative on file    Family History  Problem Relation Age of Onset  . Cancer Mother     brain and lung  . COPD Mother   . Hyperlipidemia Father   . Hypertension Father   . Diabetes Father   . Heart disease Father   . Cancer Sister     hepatic and pancreatic  . Cancer Maternal Aunt     breast    Outpatient Encounter Prescriptions as of 04/23/2012  Medication Sig Dispense Refill  . albuterol (VENTOLIN HFA) 108 (90 BASE) MCG/ACT inhaler Inhale 2 puffs into the lungs every 6 (six) hours as needed for wheezing.  18 each  4  . aspirin EC  81 MG tablet Take 81 mg by mouth daily.      . CRESTOR 20 MG tablet TAKE 1 TABLET DAILY  30 tablet  1  . LEXAPRO 20 MG tablet TAKE 1 TABLET DAILY  30 each  3  . lisinopril (PRINIVIL,ZESTRIL) 20 MG tablet TAKE 1 TABLET DAILY  30 tablet  4  . LYRICA 150 MG capsule TAKE  (1)  CAPSULE  TWICE DAILY.  60 capsule  1  . metFORMIN (GLUCOPHAGE-XR) 500 MG 24 hr tablet Take 1 tablet (500 mg total) by mouth daily.  60 tablet  3  . oxycodone (ROXICODONE) 30 MG immediate release tablet Take 30 mg by mouth every 8 (eight) hours as needed. 2 po TID.      Marland Kitchen oxyCODONE-acetaminophen (PERCOCET) 7.5-325 MG per tablet Take 1 tablet by mouth every 6 (six) hours as needed for pain.  20 tablet  0  . SEROQUEL XR 300 MG 24 hr tablet TAKE 1 TABLET AT BEDTIME.  30 tablet  1  . SOMA 350 MG tablet TAKE 1 TABLET 3 TIMES A DAY AS NEEDED FOR MUSCLE SPASMS  60 tablet  1           Objective:   Physical Exam  Constitutional: She is oriented to person, place, and time. She appears well-developed and  well-nourished.  HENT:  Head: Normocephalic and atraumatic.  Right Ear: External ear normal.  Left Ear: External ear normal.  Nose: Nose normal.  Mouth/Throat: Oropharynx is clear and moist.       TMs and canals are clear.   Eyes: Conjunctivae normal and EOM are normal. Pupils are equal, round, and reactive to light.  Neck: Neck supple. No thyromegaly present.  Cardiovascular: Normal rate, regular rhythm and normal heart sounds.   Pulmonary/Chest: Effort normal and breath sounds normal. She has no wheezes.  Abdominal: Soft. Bowel sounds are normal. She exhibits no distension and no mass. There is tenderness. There is no rebound and no guarding.       + tender in the LUQ, RUQ, and epigastrum   Lymphadenopathy:    She has no cervical adenopathy.  Neurological: She is alert and oriented to person, place, and time. She has normal reflexes.  Skin: Skin is warm and dry.  Psychiatric: She has a normal mood and affect.    On her scal hs a 1 cm round lesoin on the scalp that looks like it is healing well. No scab in place. No drianage. No palpable cyst on exam.        Assessment & Plan:  Epigastric Pain - most likely either a cholecystitis versus pancreatitis. We'll get lab work including amylase, lipase, CBC with differential et Karie Soda. We'll also schedule her for an ultrasound to evaluate the gallbladder. In the meantime continue with clear fluids today well hydrated and advance diet as tolerated.  Declined flu vaccine.   Sore on her scalp. Gave reassurance. It looks like it's healing very well. Continue current regimen and try not to pick or scratch at the lesion.  Leg pain-unclear etiology at this point in time. This could be related to peripheral neuropathy or possibly her MS. Also check her for vitamin D deficiency. Also her A1c is elevated from her usual level. Certainly her sugars being less well controlled could be contributing. She does not currently have a neurologist. She was  previously seeing Dr. Chestine Spore pinion that he is now working at [facility]. We will refer her to a new neurologist.  DM- poorly  controlled. Her A1c is elevated from last time. I like to increase her metformin 2000 mg. Also given a prescription to the pharmacy. Followup in 3 months. Continue to work on diet and exercise.

## 2012-04-24 ENCOUNTER — Other Ambulatory Visit: Payer: Medicare Other

## 2012-04-24 ENCOUNTER — Other Ambulatory Visit: Payer: Self-pay | Admitting: Family Medicine

## 2012-04-24 ENCOUNTER — Telehealth: Payer: Self-pay | Admitting: *Deleted

## 2012-04-24 DIAGNOSIS — Z881 Allergy status to other antibiotic agents status: Secondary | ICD-10-CM | POA: Diagnosis not present

## 2012-04-24 DIAGNOSIS — Z79899 Other long term (current) drug therapy: Secondary | ICD-10-CM | POA: Diagnosis not present

## 2012-04-24 DIAGNOSIS — R5381 Other malaise: Secondary | ICD-10-CM | POA: Diagnosis not present

## 2012-04-24 DIAGNOSIS — G35 Multiple sclerosis: Secondary | ICD-10-CM | POA: Diagnosis not present

## 2012-04-24 DIAGNOSIS — D696 Thrombocytopenia, unspecified: Secondary | ICD-10-CM

## 2012-04-24 DIAGNOSIS — F17200 Nicotine dependence, unspecified, uncomplicated: Secondary | ICD-10-CM | POA: Diagnosis not present

## 2012-04-24 DIAGNOSIS — Z880 Allergy status to penicillin: Secondary | ICD-10-CM | POA: Diagnosis not present

## 2012-04-24 DIAGNOSIS — M329 Systemic lupus erythematosus, unspecified: Secondary | ICD-10-CM | POA: Diagnosis not present

## 2012-04-24 DIAGNOSIS — Z8739 Personal history of other diseases of the musculoskeletal system and connective tissue: Secondary | ICD-10-CM | POA: Diagnosis not present

## 2012-04-24 DIAGNOSIS — K859 Acute pancreatitis without necrosis or infection, unspecified: Secondary | ICD-10-CM

## 2012-04-24 DIAGNOSIS — Z90710 Acquired absence of both cervix and uterus: Secondary | ICD-10-CM | POA: Diagnosis not present

## 2012-04-24 DIAGNOSIS — E119 Type 2 diabetes mellitus without complications: Secondary | ICD-10-CM | POA: Diagnosis not present

## 2012-04-24 DIAGNOSIS — K5730 Diverticulosis of large intestine without perforation or abscess without bleeding: Secondary | ICD-10-CM | POA: Diagnosis not present

## 2012-04-24 DIAGNOSIS — IMO0001 Reserved for inherently not codable concepts without codable children: Secondary | ICD-10-CM | POA: Diagnosis not present

## 2012-04-24 DIAGNOSIS — G8929 Other chronic pain: Secondary | ICD-10-CM | POA: Diagnosis not present

## 2012-04-24 DIAGNOSIS — Z7982 Long term (current) use of aspirin: Secondary | ICD-10-CM | POA: Diagnosis not present

## 2012-04-24 DIAGNOSIS — R109 Unspecified abdominal pain: Secondary | ICD-10-CM | POA: Diagnosis not present

## 2012-04-24 LAB — COMPLETE METABOLIC PANEL WITH GFR
ALT: 15 U/L (ref 0–35)
Albumin: 4.3 g/dL (ref 3.5–5.2)
Alkaline Phosphatase: 119 U/L — ABNORMAL HIGH (ref 39–117)
CO2: 26 mEq/L (ref 19–32)
GFR, Est African American: >89 mL/min
Glucose, Bld: 166 mg/dL — ABNORMAL HIGH (ref 70–99)
Potassium: 3.7 mEq/L (ref 3.5–5.3)
Sodium: 137 mEq/L (ref 135–145)
Total Bilirubin: 0.4 mg/dL (ref 0.3–1.2)
Total Protein: 6.6 g/dL (ref 6.0–8.3)

## 2012-04-24 LAB — CBC WITH DIFFERENTIAL/PLATELET
Eosinophils Absolute: 0.1 10*3/uL (ref 0.0–0.7)
Eosinophils Relative: 1 % (ref 0–5)
HCT: 43.1 % (ref 36.0–46.0)
Lymphocytes Relative: 32 % (ref 12–46)
Lymphs Abs: 1.6 10*3/uL (ref 0.7–4.0)
MCH: 28.7 pg (ref 26.0–34.0)
MCV: 84.7 fL (ref 78.0–100.0)
Monocytes Absolute: 0.4 10*3/uL (ref 0.1–1.0)
RBC: 5.09 MIL/uL (ref 3.87–5.11)
WBC: 5 10*3/uL (ref 4.0–10.5)

## 2012-04-24 LAB — VITAMIN D 25 HYDROXY (VIT D DEFICIENCY, FRACTURES): Vit D, 25-Hydroxy: 21 ng/mL — ABNORMAL LOW (ref 30–89)

## 2012-04-24 NOTE — Telephone Encounter (Signed)
Patient platelets low and Lipase 95. Reviewed with Dr Thurmond Butts, per his request if patient still having pain or worsens go to Er. Spoke with patient and she is not feeling better and having pain.  Did not do [place] but scheduled for Friday. Pt going to [facility] center.  Will fax labs to them and called to make them aware patient is coming

## 2012-04-25 ENCOUNTER — Telehealth: Payer: Self-pay | Admitting: Family Medicine

## 2012-04-25 NOTE — Telephone Encounter (Signed)
Message copied by Agapito Games on Wed Apr 25, 2012  1:28 PM ------      Message from: Payton Spark P      Created: Wed Apr 25, 2012 10:55 AM       Pt informed. Pt went to ED yest but was discharged bc all lab work and scans were normal. Pt has seen Dr. Abbe Amsterdam in the past and is fine w/ going back. I will call to get notes from hosp. bc Pt still c/o abd pain and nausea.

## 2012-04-25 NOTE — Telephone Encounter (Signed)
Notes came through

## 2012-04-25 NOTE — Telephone Encounter (Signed)
Did they do any imaging at the hospital. I didn't see anything in the notes?

## 2012-05-09 ENCOUNTER — Telehealth: Payer: Self-pay | Admitting: *Deleted

## 2012-05-09 NOTE — Telephone Encounter (Signed)
Called to let you know this patient no showed for their appointment

## 2012-05-21 DIAGNOSIS — M19019 Primary osteoarthritis, unspecified shoulder: Secondary | ICD-10-CM | POA: Diagnosis not present

## 2012-05-21 DIAGNOSIS — Z79899 Other long term (current) drug therapy: Secondary | ICD-10-CM | POA: Diagnosis not present

## 2012-05-21 DIAGNOSIS — M545 Low back pain: Secondary | ICD-10-CM | POA: Diagnosis not present

## 2012-05-21 DIAGNOSIS — M4802 Spinal stenosis, cervical region: Secondary | ICD-10-CM | POA: Diagnosis not present

## 2012-05-21 DIAGNOSIS — M5030 Other cervical disc degeneration, unspecified cervical region: Secondary | ICD-10-CM | POA: Diagnosis not present

## 2012-05-21 DIAGNOSIS — M4690 Unspecified inflammatory spondylopathy, site unspecified: Secondary | ICD-10-CM | POA: Diagnosis not present

## 2012-05-24 ENCOUNTER — Other Ambulatory Visit: Payer: Self-pay | Admitting: Family Medicine

## 2012-06-20 ENCOUNTER — Other Ambulatory Visit: Payer: Self-pay | Admitting: Family Medicine

## 2012-06-21 ENCOUNTER — Other Ambulatory Visit: Payer: Self-pay | Admitting: Family Medicine

## 2012-07-18 ENCOUNTER — Other Ambulatory Visit: Payer: Self-pay | Admitting: Family Medicine

## 2012-07-19 NOTE — Telephone Encounter (Signed)
Ok to fill but please remind her due for appt at end of Jan for diabetes and blood pressure

## 2012-07-19 NOTE — Telephone Encounter (Signed)
done

## 2012-08-14 ENCOUNTER — Other Ambulatory Visit: Payer: Self-pay | Admitting: Family Medicine

## 2012-08-23 ENCOUNTER — Ambulatory Visit: Payer: Medicare Other | Admitting: Family Medicine

## 2012-09-11 ENCOUNTER — Other Ambulatory Visit: Payer: Self-pay | Admitting: Family Medicine

## 2012-09-12 ENCOUNTER — Other Ambulatory Visit: Payer: Self-pay | Admitting: *Deleted

## 2012-09-12 MED ORDER — LISINOPRIL 20 MG PO TABS: 20.0000 mg | ORAL_TABLET | Freq: Every day | ORAL | Status: DC

## 2012-09-17 DIAGNOSIS — Z79899 Other long term (current) drug therapy: Secondary | ICD-10-CM | POA: Diagnosis not present

## 2012-09-17 DIAGNOSIS — M5137 Other intervertebral disc degeneration, lumbosacral region: Secondary | ICD-10-CM | POA: Diagnosis not present

## 2012-09-17 DIAGNOSIS — M4690 Unspecified inflammatory spondylopathy, site unspecified: Secondary | ICD-10-CM | POA: Diagnosis not present

## 2012-09-17 DIAGNOSIS — M5030 Other cervical disc degeneration, unspecified cervical region: Secondary | ICD-10-CM | POA: Diagnosis not present

## 2012-09-17 DIAGNOSIS — M47817 Spondylosis without myelopathy or radiculopathy, lumbosacral region: Secondary | ICD-10-CM | POA: Diagnosis not present

## 2012-09-25 DIAGNOSIS — M5137 Other intervertebral disc degeneration, lumbosacral region: Secondary | ICD-10-CM | POA: Diagnosis not present

## 2012-09-25 DIAGNOSIS — M47817 Spondylosis without myelopathy or radiculopathy, lumbosacral region: Secondary | ICD-10-CM | POA: Diagnosis not present

## 2012-10-15 DIAGNOSIS — M5126 Other intervertebral disc displacement, lumbar region: Secondary | ICD-10-CM | POA: Diagnosis not present

## 2012-10-16 ENCOUNTER — Other Ambulatory Visit: Payer: Self-pay | Admitting: Family Medicine

## 2012-10-16 NOTE — Telephone Encounter (Signed)
Ok to fill?  Looks like it has been a while since labs.

## 2012-11-12 DIAGNOSIS — M5137 Other intervertebral disc degeneration, lumbosacral region: Secondary | ICD-10-CM | POA: Diagnosis not present

## 2012-11-12 DIAGNOSIS — Z79899 Other long term (current) drug therapy: Secondary | ICD-10-CM | POA: Diagnosis not present

## 2012-11-12 DIAGNOSIS — M47817 Spondylosis without myelopathy or radiculopathy, lumbosacral region: Secondary | ICD-10-CM | POA: Diagnosis not present

## 2012-11-12 DIAGNOSIS — M5030 Other cervical disc degeneration, unspecified cervical region: Secondary | ICD-10-CM | POA: Diagnosis not present

## 2012-11-12 DIAGNOSIS — M4690 Unspecified inflammatory spondylopathy, site unspecified: Secondary | ICD-10-CM | POA: Diagnosis not present

## 2012-11-13 ENCOUNTER — Other Ambulatory Visit: Payer: Self-pay | Admitting: Family Medicine

## 2012-11-19 ENCOUNTER — Ambulatory Visit: Payer: Medicare Other | Admitting: Family Medicine

## 2012-11-19 DIAGNOSIS — Z0289 Encounter for other administrative examinations: Secondary | ICD-10-CM

## 2012-12-13 ENCOUNTER — Other Ambulatory Visit: Payer: Self-pay | Admitting: Family Medicine

## 2012-12-18 ENCOUNTER — Other Ambulatory Visit: Payer: Self-pay | Admitting: Family Medicine

## 2012-12-18 NOTE — Telephone Encounter (Signed)
Last seen 04/2012

## 2012-12-21 ENCOUNTER — Other Ambulatory Visit: Payer: Self-pay | Admitting: Family Medicine

## 2012-12-22 DIAGNOSIS — R7309 Other abnormal glucose: Secondary | ICD-10-CM | POA: Diagnosis not present

## 2012-12-22 DIAGNOSIS — E860 Dehydration: Secondary | ICD-10-CM | POA: Diagnosis not present

## 2012-12-22 DIAGNOSIS — J438 Other emphysema: Secondary | ICD-10-CM | POA: Diagnosis not present

## 2012-12-22 DIAGNOSIS — N179 Acute kidney failure, unspecified: Secondary | ICD-10-CM | POA: Diagnosis not present

## 2012-12-22 DIAGNOSIS — A419 Sepsis, unspecified organism: Secondary | ICD-10-CM | POA: Diagnosis not present

## 2012-12-22 DIAGNOSIS — E1101 Type 2 diabetes mellitus with hyperosmolarity with coma: Secondary | ICD-10-CM | POA: Diagnosis not present

## 2012-12-22 DIAGNOSIS — R6889 Other general symptoms and signs: Secondary | ICD-10-CM | POA: Diagnosis not present

## 2012-12-22 DIAGNOSIS — R509 Fever, unspecified: Secondary | ICD-10-CM | POA: Diagnosis not present

## 2012-12-22 DIAGNOSIS — J441 Chronic obstructive pulmonary disease with (acute) exacerbation: Secondary | ICD-10-CM | POA: Diagnosis not present

## 2012-12-22 DIAGNOSIS — R0602 Shortness of breath: Secondary | ICD-10-CM | POA: Diagnosis not present

## 2012-12-22 DIAGNOSIS — R7301 Impaired fasting glucose: Secondary | ICD-10-CM | POA: Diagnosis not present

## 2012-12-23 DIAGNOSIS — E1101 Type 2 diabetes mellitus with hyperosmolarity with coma: Secondary | ICD-10-CM | POA: Diagnosis not present

## 2012-12-23 DIAGNOSIS — N179 Acute kidney failure, unspecified: Secondary | ICD-10-CM | POA: Diagnosis not present

## 2012-12-23 DIAGNOSIS — G8929 Other chronic pain: Secondary | ICD-10-CM | POA: Diagnosis not present

## 2012-12-23 DIAGNOSIS — A419 Sepsis, unspecified organism: Secondary | ICD-10-CM | POA: Diagnosis not present

## 2012-12-23 DIAGNOSIS — J9819 Other pulmonary collapse: Secondary | ICD-10-CM | POA: Diagnosis not present

## 2012-12-24 ENCOUNTER — Other Ambulatory Visit: Payer: Self-pay | Admitting: *Deleted

## 2012-12-24 DIAGNOSIS — N179 Acute kidney failure, unspecified: Secondary | ICD-10-CM | POA: Diagnosis not present

## 2012-12-24 DIAGNOSIS — E1101 Type 2 diabetes mellitus with hyperosmolarity with coma: Secondary | ICD-10-CM | POA: Diagnosis not present

## 2012-12-24 DIAGNOSIS — A419 Sepsis, unspecified organism: Secondary | ICD-10-CM | POA: Diagnosis not present

## 2012-12-24 DIAGNOSIS — G8929 Other chronic pain: Secondary | ICD-10-CM | POA: Diagnosis not present

## 2012-12-25 DIAGNOSIS — N179 Acute kidney failure, unspecified: Secondary | ICD-10-CM | POA: Diagnosis not present

## 2012-12-25 DIAGNOSIS — E1101 Type 2 diabetes mellitus with hyperosmolarity with coma: Secondary | ICD-10-CM | POA: Diagnosis not present

## 2012-12-25 DIAGNOSIS — G8929 Other chronic pain: Secondary | ICD-10-CM | POA: Diagnosis not present

## 2012-12-25 DIAGNOSIS — A419 Sepsis, unspecified organism: Secondary | ICD-10-CM | POA: Diagnosis not present

## 2012-12-26 ENCOUNTER — Ambulatory Visit: Payer: Medicare Other | Admitting: Physician Assistant

## 2012-12-26 DIAGNOSIS — Z0289 Encounter for other administrative examinations: Secondary | ICD-10-CM

## 2012-12-27 ENCOUNTER — Telehealth: Payer: Self-pay | Admitting: Physician Assistant

## 2012-12-27 NOTE — Telephone Encounter (Signed)
Patient called and scheduled appt for fri 12/28/12 discharged from [facility] on 12/25/12 may need to get discharge papers if needed fyi? Thanks

## 2012-12-28 ENCOUNTER — Ambulatory Visit: Payer: Medicare Other | Admitting: Physician Assistant

## 2012-12-28 ENCOUNTER — Ambulatory Visit (INDEPENDENT_AMBULATORY_CARE_PROVIDER_SITE_OTHER): Payer: Medicare Other | Admitting: Physician Assistant

## 2012-12-28 ENCOUNTER — Encounter: Payer: Self-pay | Admitting: Physician Assistant

## 2012-12-28 VITALS — BP 113/73 | HR 95 | Wt 219.0 lb

## 2012-12-28 DIAGNOSIS — E1165 Type 2 diabetes mellitus with hyperglycemia: Secondary | ICD-10-CM

## 2012-12-28 DIAGNOSIS — N179 Acute kidney failure, unspecified: Secondary | ICD-10-CM

## 2012-12-28 DIAGNOSIS — IMO0002 Reserved for concepts with insufficient information to code with codable children: Secondary | ICD-10-CM

## 2012-12-28 MED ORDER — VENLAFAXINE HCL ER 75 MG PO CP24: 75.0000 mg | ORAL_CAPSULE | Freq: Every day | ORAL | Status: DC

## 2012-12-28 MED ORDER — INSULIN ASPART 100 UNIT/ML FLEXPEN: PEN_INJECTOR | SUBCUTANEOUS | Status: DC

## 2012-12-28 NOTE — Patient Instructions (Addendum)
levimer 10 units at bedtime. Increase by 2 units every 2 days until under 120 fasting.  Start novolog 4 units before largest meal.   Follow up in 3 months.

## 2012-12-28 NOTE — Progress Notes (Signed)
  Subjective:    Patient ID: Amber Faulkner, female    DOB: 06/19/1961, 52 y.o.   MRN: [id number]  HPI Patient presents to the clinic to follow up from ER on 6/13. Her friend called EMS because she had been throwing up for 2 days and very weak. Sugars were above 500. WBC 9.8.Chest xray normal.PT/INR/Ddimer normal range.UA protein and glucose. Acute renal failure at 2.24. Cardaic enzymes normal.A1C was 12.8. She was taking levimer and novolog in hospital she was not given rx for novolog. Currently on 10units at night of levimer and metformin 1000mg  twice a day.   She is having hot flashes again. NOt taking lexapro. Does have a family history of breast cancer.          Review of Systems     Objective:   Physical Exam  Constitutional: She appears well-developed and well-nourished.  HENT:  Head: Normocephalic and atraumatic.  Cardiovascular: Normal rate, regular rhythm and normal heart sounds.   Pulmonary/Chest: Effort normal and breath sounds normal.  Skin: Skin is warm and dry.  Psychiatric: She has a normal mood and affect. Her behavior is normal.          Assessment & Plan:  Diabetes Mellitus, uncontrolled- discussed with patient to start slowing titrating up on levimer adding 2 units every 2 days until fasting sugars around 120 or below then call office. Also gave samples and rx for novolog to start 4 units at largest meal. Pay attention to diet. Follow up in 3 months. Call if sugars are not improving. Gave lab slip to get bmp checked in next week or so.  Hot flashes- Do not want to start HRT. Gave effexor to try for hot flashes and overall mood.    Spent 30 minutes with patient and greater than 50 percent of visit spent counseling patient regarding Diabetes control.

## 2012-12-28 NOTE — Telephone Encounter (Signed)
Called [facility] & asked for hosp papers.

## 2013-01-02 ENCOUNTER — Telehealth: Payer: Self-pay | Admitting: *Deleted

## 2013-01-02 NOTE — Telephone Encounter (Signed)
Are you increasing levimer every 2 nights by 2 units? You should be. You may increase humolog to 4 units and lunch and breakfast. Call in if sugars not coming down.

## 2013-01-02 NOTE — Telephone Encounter (Signed)
Pt notified of PA instrucitons. Barry Dienes, LPN

## 2013-01-02 NOTE — Telephone Encounter (Signed)
Pt calls and states seen you last Friday and you puther on Humalog Insulin and Levemir to take at HS and at large meal. She states that the Humalog isn't working her sugars are staying in the 200-300 range. Please advise

## 2013-01-10 DIAGNOSIS — Z79899 Other long term (current) drug therapy: Secondary | ICD-10-CM | POA: Diagnosis not present

## 2013-01-10 DIAGNOSIS — M4690 Unspecified inflammatory spondylopathy, site unspecified: Secondary | ICD-10-CM | POA: Diagnosis not present

## 2013-01-10 DIAGNOSIS — M47817 Spondylosis without myelopathy or radiculopathy, lumbosacral region: Secondary | ICD-10-CM | POA: Diagnosis not present

## 2013-01-10 DIAGNOSIS — M5030 Other cervical disc degeneration, unspecified cervical region: Secondary | ICD-10-CM | POA: Diagnosis not present

## 2013-01-10 DIAGNOSIS — M5137 Other intervertebral disc degeneration, lumbosacral region: Secondary | ICD-10-CM | POA: Diagnosis not present

## 2013-01-12 ENCOUNTER — Other Ambulatory Visit: Payer: Self-pay | Admitting: Family Medicine

## 2013-01-17 ENCOUNTER — Other Ambulatory Visit: Payer: Self-pay

## 2013-01-17 ENCOUNTER — Other Ambulatory Visit: Payer: Self-pay | Admitting: Family Medicine

## 2013-01-18 ENCOUNTER — Other Ambulatory Visit: Payer: Self-pay | Admitting: Family Medicine

## 2013-01-18 NOTE — Telephone Encounter (Signed)
She needs soma sent today before 12.   Message sent to Dr Linford Arnold due to not being able to print in Urgent Care.

## 2013-02-11 ENCOUNTER — Other Ambulatory Visit: Payer: Self-pay | Admitting: Family Medicine

## 2013-02-11 ENCOUNTER — Other Ambulatory Visit: Payer: Self-pay | Admitting: *Deleted

## 2013-02-11 MED ORDER — CARISOPRODOL 350 MG PO TABS: ORAL_TABLET | ORAL | Status: DC

## 2013-02-11 NOTE — Telephone Encounter (Signed)
Pt calls and asks if you will refill the Soma early as she feels like it is not working like it used to and has to take more a day for relief of muscle spasms.

## 2013-02-11 NOTE — Telephone Encounter (Signed)
Informed pt that she will need to make a f/u appt with Dr.Metheney asap.Loralee Pacas [place]

## 2013-03-04 DIAGNOSIS — M4690 Unspecified inflammatory spondylopathy, site unspecified: Secondary | ICD-10-CM | POA: Diagnosis not present

## 2013-03-04 DIAGNOSIS — M5137 Other intervertebral disc degeneration, lumbosacral region: Secondary | ICD-10-CM | POA: Diagnosis not present

## 2013-03-04 DIAGNOSIS — M5030 Other cervical disc degeneration, unspecified cervical region: Secondary | ICD-10-CM | POA: Diagnosis not present

## 2013-03-04 DIAGNOSIS — M47817 Spondylosis without myelopathy or radiculopathy, lumbosacral region: Secondary | ICD-10-CM | POA: Diagnosis not present

## 2013-03-04 DIAGNOSIS — Z79899 Other long term (current) drug therapy: Secondary | ICD-10-CM | POA: Diagnosis not present

## 2013-03-04 DIAGNOSIS — M790 Rheumatism, unspecified: Secondary | ICD-10-CM | POA: Diagnosis not present

## 2013-03-12 ENCOUNTER — Other Ambulatory Visit: Payer: Self-pay | Admitting: Family Medicine

## 2013-04-08 DIAGNOSIS — Z23 Encounter for immunization: Secondary | ICD-10-CM | POA: Diagnosis not present

## 2013-04-10 ENCOUNTER — Other Ambulatory Visit: Payer: Self-pay | Admitting: Physician Assistant

## 2013-04-10 ENCOUNTER — Other Ambulatory Visit: Payer: Self-pay | Admitting: Family Medicine

## 2013-05-02 DIAGNOSIS — Z79899 Other long term (current) drug therapy: Secondary | ICD-10-CM | POA: Diagnosis not present

## 2013-05-02 DIAGNOSIS — M5137 Other intervertebral disc degeneration, lumbosacral region: Secondary | ICD-10-CM | POA: Diagnosis not present

## 2013-05-02 DIAGNOSIS — M5030 Other cervical disc degeneration, unspecified cervical region: Secondary | ICD-10-CM | POA: Diagnosis not present

## 2013-05-02 DIAGNOSIS — M47817 Spondylosis without myelopathy or radiculopathy, lumbosacral region: Secondary | ICD-10-CM | POA: Diagnosis not present

## 2013-05-02 DIAGNOSIS — M4690 Unspecified inflammatory spondylopathy, site unspecified: Secondary | ICD-10-CM | POA: Diagnosis not present

## 2013-05-14 ENCOUNTER — Telehealth: Payer: Self-pay | Admitting: Family Medicine

## 2013-05-14 ENCOUNTER — Other Ambulatory Visit: Payer: Self-pay | Admitting: Family Medicine

## 2013-05-14 ENCOUNTER — Other Ambulatory Visit: Payer: Self-pay | Admitting: Physician Assistant

## 2013-05-14 NOTE — Telephone Encounter (Signed)
Please let patient know it was filled today. It looks like there may have been the pharmacy request.

## 2013-05-14 NOTE — Telephone Encounter (Signed)
Patient wants refills on soma and lyrica called into [facility].  She is calling back to set up appt since she hasn't been here for a while

## 2013-05-16 ENCOUNTER — Encounter: Payer: Self-pay | Admitting: Family Medicine

## 2013-05-16 ENCOUNTER — Ambulatory Visit (INDEPENDENT_AMBULATORY_CARE_PROVIDER_SITE_OTHER): Payer: Medicare Other | Admitting: Family Medicine

## 2013-05-16 VITALS — BP 112/77 | HR 73 | Temp 97.2°F

## 2013-05-16 DIAGNOSIS — R0789 Other chest pain: Secondary | ICD-10-CM

## 2013-05-16 DIAGNOSIS — M545 Low back pain: Secondary | ICD-10-CM

## 2013-05-16 DIAGNOSIS — R5381 Other malaise: Secondary | ICD-10-CM | POA: Diagnosis not present

## 2013-05-16 DIAGNOSIS — R1012 Left upper quadrant pain: Secondary | ICD-10-CM | POA: Diagnosis not present

## 2013-05-16 DIAGNOSIS — E119 Type 2 diabetes mellitus without complications: Secondary | ICD-10-CM | POA: Diagnosis not present

## 2013-05-16 DIAGNOSIS — G629 Polyneuropathy, unspecified: Secondary | ICD-10-CM

## 2013-05-16 DIAGNOSIS — G589 Mononeuropathy, unspecified: Secondary | ICD-10-CM

## 2013-05-16 LAB — POCT UA - MICROALBUMIN
Albumin/Creatinine Ratio, Urine, POC: <30
Creatinine, POC: 200 mg/dL
Microalbumin Ur, POC: 30 mg/L

## 2013-05-16 LAB — POCT GLYCOSYLATED HEMOGLOBIN (HGB A1C): Hemoglobin A1C: 6.6

## 2013-05-16 NOTE — Progress Notes (Signed)
Subjective:    Patient ID: Amber Faulkner, female    DOB: 09/11/1960, 52 y.o.   MRN: [id number]  HPI DM - No hypoglycemic events.  occ shoots ups.  No wounds that aren't healing well.  Using Levemir 8-12 units.  Using novolog 4-6 times per day.  Good job with diet.  Still doing yoga and getting some exercise but not consistent,   HTN- .Haivn occ midsternal sharp CP that fades in and out. Used to happen occasionally but now happening a few times a week. Can last up to 15 min. Can happen at rest or activity.  Sometimes worse with activity.  No nausea. Maybe sweaty with it.  No known triggers or alleviating factors. She does not feel like this is heartburn related. She is a smoker. No dizziness or lightheadedness no shortness of breath.  2 days of LUQ tendernss and pain. Says it hurts to bend at the waist. She says it was hard and felt hot initially. She says it actually feels a little bit better and softer today. She denies any nausea vomiting or bowel issues with it. No fever. She denies any skin rashes or trauma.  She's asking for preventive prescriptions for her soma and Lyrica. Review of Systems  BP 112/77  Pulse 73  Temp(Src) 97.2 F (36.2 C) (Oral)    Allergies  Allergen Reactions  . Penicillins Hives    Past Medical History  Diagnosis Date  . Diabetes mellitus   . ADD (attention deficit disorder)   . Hyperlipidemia   . HIV infection   . Neuromuscular disorder   . Chronic pain   . Anxiety     Past Surgical History  Procedure Laterality Date  . Cesarean section  1980  . Hernia repair  1976  . Abdominal hysterectomy  1991    complete  . Carpal tunnel release  1993-1994    both wrist  . Tarsal tunnel release  1993    left ankle  . Tumor removal  1993    left thigh    History   Social History  . Marital Status: Single    Spouse Name: N/A    Number of Children: N/A  . Years of Education: N/A   Occupational History  . Not on file.   Social History Main Topics   . Smoking status: Current Every Day Smoker -- 1.50 packs/day    Types: Cigarettes  . Smokeless tobacco: Not on file  . Alcohol Use: No  . Drug Use: No  . Sexual Activity: Not on file   Other Topics Concern  . Not on file   Social History Narrative  . No narrative on file    Family History  Problem Relation Age of Onset  . Cancer Mother     brain and lung  . COPD Mother   . Hyperlipidemia Father   . Hypertension Father   . Diabetes Father   . Heart disease Father   . Cancer Sister     hepatic and pancreatic  . Cancer Maternal Aunt     breast    Outpatient Encounter Prescriptions as of 05/16/2013  Medication Sig  . albuterol (VENTOLIN HFA) 108 (90 BASE) MCG/ACT inhaler Inhale 2 puffs into the lungs every 6 (six) hours as needed for wheezing.  Marland Kitchen aspirin EC 81 MG tablet Take 81 mg by mouth daily.  . carisoprodol (SOMA) 350 MG tablet TAKE 1 TABLET THREE TIMES DAILY AS NEEDED FOR MUSCLE.  Marland Kitchen CRESTOR 20 MG tablet TAKE  1 TABLET DAILY  . insulin aspart (NOVOLOG FLEXPEN) 100 unit/mL SOLN FlexPen Take 4 units before largest meal.  . Insulin Detemir (LEVEMIR FLEXPEN) 100 UNIT/ML SOPN Inject 10 Units into the skin at bedtime. Add 2 unites every 2 days until get to 120 or less fasting blood sugar.  . lisinopril (PRINIVIL,ZESTRIL) 20 MG tablet TAKE 1 TABLET DAILY.  Marland Kitchen LYRICA 150 MG capsule TAKE 1 CAPSULE TWICE DAILY.  Marland Kitchen oxycodone (ROXICODONE) 30 MG immediate release tablet Take 30 mg by mouth every 8 (eight) hours as needed. 2 po TID.  Marland Kitchen SEROQUEL XR 300 MG 24 hr tablet TAKE 1 TABLET AT BEDTIME.  Marland Kitchen venlafaxine XR (EFFEXOR-XR) 75 MG 24 hr capsule Take 1 capsule (75 mg total) by mouth daily.  . [DISCONTINUED] metFORMIN (GLUCOPHAGE) 1000 MG tablet TAKE 1 TABLET DAILY WITH BREAKFAST          Objective:   Physical Exam  Constitutional: She is oriented to person, place, and time. She appears well-developed and well-nourished.  HENT:  Head: Normocephalic and atraumatic.  Cardiovascular:  Normal rate, regular rhythm and normal heart sounds.   Pulmonary/Chest: Effort normal and breath sounds normal.  Abdominal: Soft. She exhibits no distension and no mass. There is tenderness. There is no rebound and no guarding.  Tender with superficial palpation over the left upper quadrant and aorund the Umbilicus  Neurological: She is alert and oriented to person, place, and time.  Skin: Skin is warm and dry.  Psychiatric: She has a normal mood and affect. Her behavior is normal.          Assessment & Plan:  DM- A1C is 6.6. Well controlled. Continue current regimen, except I did encourage her to increase her Levemir by 1 unit each day until her fasting sugars are under 130. This will hopefully minimize her use of NovoLog. I would like to get her back down to just mealtime NovoLog. She's actually doing it herself up to 6 times a day depending on her blood sugar. Foot exam done today.  Several years Overdue for eye exam. Reminded her to schedule as soon as possible. ON insulin, ACEi,  statin, ASA.  Followup in 3 months  Lumbar back pain-she was requesting we prescription for soma. It was still 2 days ago.  Neuropathy-we also filled her prescription for Lyrica 2 days ago as well.  HTN - Well conttrolled, conitnue current regimen.   Atypical CP- consider cardiac disease and she is high risk with hypertension, hyperlipidemia, and diabetes, and tobacco abuse. We'll do an EKG today. Get cardiac enzymes. We'll also check a CBC to evaluate for anemia as well as TSH evaluate for thyroid disease. If her blood work is normal recommend referral for stress test for further evaluation. EKG shows rate of 68 beats per minute, normal sinus rhythm with a normal axis, no ST-T wave elevation or abnormalities. Prolonged QT.   LUQ tenderness.  - Unclear etiology. I do not palpate any hernia. I do not see any skin lesion or abscess. She says the discomfort is improved today. I encouraged her to keep an eye on it  over the next couple days. I will check her electrolytes as well as a CBC to rule out infection but I do not see any specific cause of the tenderness. It is not deep as would be seen with a pancreatitis or bowel issues. It is very superficial over the skin.

## 2013-05-16 NOTE — Patient Instructions (Signed)
We will call you with your lab results.   Increase your Levemir one unit a day until morning glucose is under 130. Then stay at that dose. This should help you be able to decrease her use on her NovoLog.

## 2013-05-17 LAB — COMPLETE METABOLIC PANEL WITH GFR
Albumin: 4.5 g/dL (ref 3.5–5.2)
Alkaline Phosphatase: 108 U/L (ref 39–117)
BUN: 13 mg/dL (ref 6–23)
Calcium: 9.6 mg/dL (ref 8.4–10.5)
Chloride: 101 mEq/L (ref 96–112)
Creat: 0.62 mg/dL (ref 0.50–1.10)
GFR, Est Non African American: >89 mL/min
Glucose, Bld: 189 mg/dL — ABNORMAL HIGH (ref 70–99)
Potassium: 4.4 mEq/L (ref 3.5–5.3)

## 2013-05-17 LAB — TSH: TSH: 0.69 u[IU]/mL (ref 0.350–4.500)

## 2013-05-17 LAB — CBC WITH DIFFERENTIAL/PLATELET
HCT: 48.9 % — ABNORMAL HIGH (ref 36.0–46.0)
Hemoglobin: 16.5 g/dL — ABNORMAL HIGH (ref 12.0–15.0)
Lymphocytes Relative: 10 % — ABNORMAL LOW (ref 12–46)
MCV: 83.3 fL (ref 78.0–100.0)
Monocytes Absolute: 0.4 10*3/uL (ref 0.1–1.0)
Monocytes Relative: 5 % (ref 3–12)
Neutro Abs: 7.7 10*3/uL (ref 1.7–7.7)
WBC: 9.1 10*3/uL (ref 4.0–10.5)

## 2013-05-17 LAB — LIPID PANEL
Cholesterol: 155 mg/dL (ref 0–200)
Total CHOL/HDL Ratio: 2.9 Ratio
Triglycerides: 106 mg/dL (ref <150)

## 2013-05-17 LAB — CK TOTAL AND CKMB (NOT AT ARMC)

## 2013-05-20 ENCOUNTER — Telehealth: Payer: Self-pay | Admitting: *Deleted

## 2013-05-20 DIAGNOSIS — R0789 Other chest pain: Secondary | ICD-10-CM

## 2013-06-12 ENCOUNTER — Other Ambulatory Visit: Payer: Self-pay | Admitting: Family Medicine

## 2013-07-03 DIAGNOSIS — M4690 Unspecified inflammatory spondylopathy, site unspecified: Secondary | ICD-10-CM | POA: Diagnosis not present

## 2013-07-03 DIAGNOSIS — M5137 Other intervertebral disc degeneration, lumbosacral region: Secondary | ICD-10-CM | POA: Diagnosis not present

## 2013-07-03 DIAGNOSIS — Z79899 Other long term (current) drug therapy: Secondary | ICD-10-CM | POA: Diagnosis not present

## 2013-07-03 DIAGNOSIS — M5030 Other cervical disc degeneration, unspecified cervical region: Secondary | ICD-10-CM | POA: Diagnosis not present

## 2013-07-03 DIAGNOSIS — M47817 Spondylosis without myelopathy or radiculopathy, lumbosacral region: Secondary | ICD-10-CM | POA: Diagnosis not present

## 2013-07-12 ENCOUNTER — Other Ambulatory Visit: Payer: Self-pay | Admitting: Family Medicine

## 2013-07-12 ENCOUNTER — Other Ambulatory Visit: Payer: Self-pay | Admitting: Physician Assistant

## 2013-07-16 ENCOUNTER — Other Ambulatory Visit: Payer: Self-pay | Admitting: Family Medicine

## 2013-08-15 ENCOUNTER — Other Ambulatory Visit: Payer: Self-pay | Admitting: Family Medicine

## 2013-08-29 DIAGNOSIS — M4690 Unspecified inflammatory spondylopathy, site unspecified: Secondary | ICD-10-CM | POA: Diagnosis not present

## 2013-08-29 DIAGNOSIS — Z79899 Other long term (current) drug therapy: Secondary | ICD-10-CM | POA: Diagnosis not present

## 2013-08-29 DIAGNOSIS — M5030 Other cervical disc degeneration, unspecified cervical region: Secondary | ICD-10-CM | POA: Diagnosis not present

## 2013-08-29 DIAGNOSIS — M5137 Other intervertebral disc degeneration, lumbosacral region: Secondary | ICD-10-CM | POA: Diagnosis not present

## 2013-08-29 DIAGNOSIS — M47817 Spondylosis without myelopathy or radiculopathy, lumbosacral region: Secondary | ICD-10-CM | POA: Diagnosis not present

## 2013-09-13 ENCOUNTER — Other Ambulatory Visit: Payer: Self-pay | Admitting: Physician Assistant

## 2013-09-25 ENCOUNTER — Ambulatory Visit: Payer: Medicare Other | Admitting: Physician Assistant

## 2013-10-14 ENCOUNTER — Other Ambulatory Visit: Payer: Self-pay | Admitting: Family Medicine

## 2013-10-22 DIAGNOSIS — M5137 Other intervertebral disc degeneration, lumbosacral region: Secondary | ICD-10-CM | POA: Diagnosis not present

## 2013-10-22 DIAGNOSIS — M4690 Unspecified inflammatory spondylopathy, site unspecified: Secondary | ICD-10-CM | POA: Diagnosis not present

## 2013-10-22 DIAGNOSIS — M5030 Other cervical disc degeneration, unspecified cervical region: Secondary | ICD-10-CM | POA: Diagnosis not present

## 2013-10-22 DIAGNOSIS — M47817 Spondylosis without myelopathy or radiculopathy, lumbosacral region: Secondary | ICD-10-CM | POA: Diagnosis not present

## 2013-10-22 DIAGNOSIS — Z79899 Other long term (current) drug therapy: Secondary | ICD-10-CM | POA: Diagnosis not present

## 2013-11-13 ENCOUNTER — Other Ambulatory Visit: Payer: Self-pay | Admitting: Physician Assistant

## 2013-11-13 ENCOUNTER — Other Ambulatory Visit: Payer: Self-pay | Admitting: Family Medicine

## 2013-11-21 ENCOUNTER — Ambulatory Visit (INDEPENDENT_AMBULATORY_CARE_PROVIDER_SITE_OTHER): Payer: Medicare Other | Admitting: Family Medicine

## 2013-11-21 ENCOUNTER — Encounter: Payer: Self-pay | Admitting: Family Medicine

## 2013-11-21 VITALS — BP 109/68 | HR 96 | Wt 212.0 lb

## 2013-11-21 DIAGNOSIS — I10 Essential (primary) hypertension: Secondary | ICD-10-CM

## 2013-11-21 DIAGNOSIS — N952 Postmenopausal atrophic vaginitis: Secondary | ICD-10-CM | POA: Diagnosis not present

## 2013-11-21 DIAGNOSIS — E119 Type 2 diabetes mellitus without complications: Secondary | ICD-10-CM

## 2013-11-21 LAB — POCT GLYCOSYLATED HEMOGLOBIN (HGB A1C): Hemoglobin A1C: >14

## 2013-11-21 MED ORDER — METFORMIN HCL 1000 MG PO TABS: 1000.0000 mg | ORAL_TABLET | Freq: Two times a day (BID) | ORAL | Status: DC

## 2013-11-21 MED ORDER — ESTROGENS, CONJUGATED 0.625 MG/GM VA CREA: 1.0000 | TOPICAL_CREAM | Freq: Every day | VAGINAL | Status: DC

## 2013-11-21 NOTE — Progress Notes (Signed)
Subjective:    Patient ID: Amber Faulkner, female    DOB: Mar 10, 1961, 53 y.o.   MRN: [id number]  HPI Diabetes - using her humalog and using about 60 units aday. She really is actually not sure how many units of Levemir she uses it all. Asked her several times throughout the conversation she was not able to tell me. If she thought she was taking it twice a day at her evening meal and at bedtime. I'm not sure if this was adjusted during her hospitalization. She last saw me 6 months ago and she . She has had some poor wound healing of the last few months. No active infections. She is checking her blood sugars at home and they're typically over 300. She checks her blood sugar 8 times a day and says she does her insulin based on those numbers. She also says she does this her Levemir based on her blood sugar numbers. She reports she is using her Levemir at her evening meal and at bedtime.  Hypertension- Pt denies chest pain, SOB, dizziness, or heart palpitations.  Taking meds as directed w/o problems.  Denies medication side effects.    Vaginal dryness- she has recently become sexually active again. She's had some significant pain with intercourse. She had a hysterectomy years ago. Was a complete hysterectomy so she has gone through menopause.  Review of Systems  BP 109/68  Pulse 96  Wt 212 lb (96.163 kg)    Allergies  Allergen Reactions  . Penicillins Hives    Past Medical History  Diagnosis Date  . Diabetes mellitus   . ADD (attention deficit disorder)   . Hyperlipidemia   . HIV infection   . Neuromuscular disorder   . Chronic pain   . Anxiety     Past Surgical History  Procedure Laterality Date  . Cesarean section  1980  . Hernia repair  1976  . Abdominal hysterectomy  1991    complete  . Carpal tunnel release  1993-1994    both wrist  . Tarsal tunnel release  1993    left ankle  . Tumor removal  1993    left thigh    History   Social History  . Marital Status: Single     Spouse Name: N/A    Number of Children: N/A  . Years of Education: N/A   Occupational History  . Not on file.   Social History Main Topics  . Smoking status: Current Every Day Smoker -- 1.50 packs/day    Types: Cigarettes  . Smokeless tobacco: Not on file  . Alcohol Use: No  . Drug Use: No  . Sexual Activity: Not on file   Other Topics Concern  . Not on file   Social History Narrative  . No narrative on file    Family History  Problem Relation Age of Onset  . Cancer Mother     brain and lung  . COPD Mother   . Hyperlipidemia Father   . Hypertension Father   . Diabetes Father   . Heart disease Father   . Cancer Sister     hepatic and pancreatic  . Cancer Maternal Aunt     breast    Outpatient Encounter Prescriptions as of 11/21/2013  Medication Sig  . albuterol (VENTOLIN HFA) 108 (90 BASE) MCG/ACT inhaler Inhale 2 puffs into the lungs every 6 (six) hours as needed for wheezing.  Marland Kitchen aspirin EC 81 MG tablet Take 81 mg by mouth daily.  Marland Kitchen  carisoprodol (SOMA) 350 MG tablet TAKE ONE TABLET THREE TIMES DAILY AS NEEDED  . CRESTOR 20 MG tablet TAKE ONE TABLET DAILY  . Insulin Detemir (LEVEMIR FLEXPEN) 100 UNIT/ML SOPN Inject 60 Units into the skin at bedtime. Add 2 unites every 2 days until get to 120 or less fasting blood sugar.  . insulin lispro (HUMALOG KWIKPEN) 100 UNIT/ML KiwkPen Inject into the skin. Sliding scale  . lisinopril (PRINIVIL,ZESTRIL) 20 MG tablet TAKE ONE TABLET DAILY  . LYRICA 150 MG capsule TAKE ONE CAPSULE TWICE DAILY  . oxycodone (ROXICODONE) 30 MG immediate release tablet Take 30 mg by mouth every 8 (eight) hours as needed. 2 po TID.  Marland Kitchen SEROQUEL XR 300 MG 24 hr tablet TAKE ONE TABLET DAILY AT BEDTIME  . venlafaxine XR (EFFEXOR-XR) 75 MG 24 hr capsule TAKE ONE CAPSULE DAILY  . [DISCONTINUED] insulin aspart (NOVOLOG FLEXPEN) 100 unit/mL SOLN FlexPen Take 4 units before largest meal.  . conjugated estrogens (PREMARIN) vaginal cream Place 1 Applicatorful  vaginally daily.  . metFORMIN (GLUCOPHAGE) 1000 MG tablet Take 1 tablet (1,000 mg total) by mouth 2 (two) times daily with a meal.          Objective:   Physical Exam  Constitutional: She is oriented to person, place, and time. She appears well-developed and well-nourished.  HENT:  Head: Normocephalic and atraumatic.  Cardiovascular: Normal rate, regular rhythm and normal heart sounds.   Pulmonary/Chest: Effort normal and breath sounds normal.  Neurological: She is alert and oriented to person, place, and time.  Skin: Skin is warm and dry.  Psychiatric: She has a normal mood and affect. Her behavior is normal.          Assessment & Plan:  Diabetes - Uncontrolled. It is very concerning to me that she says she doesn't have much Levemir she's using when she should be getting it every day. She also says she is using a sliding scale but when asked about the specifics she actually said she had lost it. So not really sure how much is really using her insulin. She did actually bring her Humalog pen in with her. In fact it was in her purse today. I wrote down a very specific regimen for her so that she can use it as reference. We will set her Levemir to 60 units at bedtime. Would like to see her back in 6 weeks to see if we are getting her sugars under better control. I'm not sure what is very different from 6 months ago when her hemoglobin A1c looks fantastic.  HTN-  Well controlled. Continue current regimen.  Vaginal atrophy/post menopausal.  -We discussed options. She's had a complete hysterectomy. Can try some vaginal Premarin. Use nightly for 2 weeks and then 2-3 times a week for maintenance.

## 2013-11-21 NOTE — Patient Instructions (Addendum)
Levemir 60 unit at bedtime.  Humalog 15 minutes before you meals and for bedtime, sliding scale.   >300  10 units  250-299  8 units  200-149  6 units  150-199  4 units   100-149  2 units Call if sugars are still running in the 300 after 1 week on this regimen Make sure drinking plenty of water.  Restart you metformin.

## 2013-12-14 ENCOUNTER — Other Ambulatory Visit: Payer: Self-pay | Admitting: Family Medicine

## 2013-12-16 DIAGNOSIS — Z79899 Other long term (current) drug therapy: Secondary | ICD-10-CM | POA: Diagnosis not present

## 2013-12-16 DIAGNOSIS — M5137 Other intervertebral disc degeneration, lumbosacral region: Secondary | ICD-10-CM | POA: Diagnosis not present

## 2013-12-16 DIAGNOSIS — M5030 Other cervical disc degeneration, unspecified cervical region: Secondary | ICD-10-CM | POA: Diagnosis not present

## 2013-12-16 DIAGNOSIS — M790 Rheumatism, unspecified: Secondary | ICD-10-CM | POA: Diagnosis not present

## 2013-12-16 DIAGNOSIS — M4690 Unspecified inflammatory spondylopathy, site unspecified: Secondary | ICD-10-CM | POA: Diagnosis not present

## 2013-12-16 DIAGNOSIS — M797 Fibromyalgia: Secondary | ICD-10-CM | POA: Diagnosis not present

## 2013-12-16 DIAGNOSIS — M47817 Spondylosis without myelopathy or radiculopathy, lumbosacral region: Secondary | ICD-10-CM | POA: Diagnosis not present

## 2014-01-02 ENCOUNTER — Ambulatory Visit: Payer: Medicare Other | Admitting: Family Medicine

## 2014-01-06 ENCOUNTER — Ambulatory Visit: Payer: Medicare Other | Admitting: Physician Assistant

## 2014-01-06 DIAGNOSIS — Z0289 Encounter for other administrative examinations: Secondary | ICD-10-CM

## 2014-01-16 ENCOUNTER — Other Ambulatory Visit: Payer: Self-pay | Admitting: Family Medicine

## 2014-01-21 ENCOUNTER — Telehealth: Payer: Self-pay

## 2014-01-21 NOTE — Telephone Encounter (Signed)
Left message for patient to return call to schedule a follow up for DM.

## 2014-02-10 DIAGNOSIS — M4690 Unspecified inflammatory spondylopathy, site unspecified: Secondary | ICD-10-CM | POA: Diagnosis not present

## 2014-02-10 DIAGNOSIS — Z79899 Other long term (current) drug therapy: Secondary | ICD-10-CM | POA: Diagnosis not present

## 2014-02-10 DIAGNOSIS — M5030 Other cervical disc degeneration, unspecified cervical region: Secondary | ICD-10-CM | POA: Diagnosis not present

## 2014-02-10 DIAGNOSIS — M5137 Other intervertebral disc degeneration, lumbosacral region: Secondary | ICD-10-CM | POA: Diagnosis not present

## 2014-02-10 DIAGNOSIS — M47817 Spondylosis without myelopathy or radiculopathy, lumbosacral region: Secondary | ICD-10-CM | POA: Diagnosis not present

## 2014-03-13 DIAGNOSIS — R5382 Chronic fatigue, unspecified: Secondary | ICD-10-CM | POA: Diagnosis not present

## 2014-03-13 DIAGNOSIS — R5383 Other fatigue: Secondary | ICD-10-CM | POA: Diagnosis not present

## 2014-03-13 DIAGNOSIS — M329 Systemic lupus erythematosus, unspecified: Secondary | ICD-10-CM | POA: Diagnosis not present

## 2014-03-13 DIAGNOSIS — Z881 Allergy status to other antibiotic agents status: Secondary | ICD-10-CM | POA: Diagnosis not present

## 2014-03-13 DIAGNOSIS — G9332 Myalgic encephalomyelitis/chronic fatigue syndrome: Secondary | ICD-10-CM | POA: Diagnosis not present

## 2014-03-13 DIAGNOSIS — S0003XA Contusion of scalp, initial encounter: Secondary | ICD-10-CM | POA: Diagnosis not present

## 2014-03-13 DIAGNOSIS — Z7982 Long term (current) use of aspirin: Secondary | ICD-10-CM | POA: Diagnosis not present

## 2014-03-13 DIAGNOSIS — S0510XA Contusion of eyeball and orbital tissues, unspecified eye, initial encounter: Secondary | ICD-10-CM | POA: Diagnosis not present

## 2014-03-13 DIAGNOSIS — R5381 Other malaise: Secondary | ICD-10-CM | POA: Diagnosis not present

## 2014-03-13 DIAGNOSIS — S0990XA Unspecified injury of head, initial encounter: Secondary | ICD-10-CM | POA: Diagnosis not present

## 2014-03-13 DIAGNOSIS — Z79899 Other long term (current) drug therapy: Secondary | ICD-10-CM | POA: Diagnosis not present

## 2014-03-13 DIAGNOSIS — S1093XA Contusion of unspecified part of neck, initial encounter: Secondary | ICD-10-CM | POA: Diagnosis not present

## 2014-03-13 DIAGNOSIS — F17200 Nicotine dependence, unspecified, uncomplicated: Secondary | ICD-10-CM | POA: Diagnosis not present

## 2014-03-13 DIAGNOSIS — I10 Essential (primary) hypertension: Secondary | ICD-10-CM | POA: Diagnosis not present

## 2014-03-13 DIAGNOSIS — G8929 Other chronic pain: Secondary | ICD-10-CM | POA: Diagnosis not present

## 2014-03-13 DIAGNOSIS — G35 Multiple sclerosis: Secondary | ICD-10-CM | POA: Diagnosis not present

## 2014-03-13 DIAGNOSIS — R55 Syncope and collapse: Secondary | ICD-10-CM | POA: Diagnosis not present

## 2014-03-13 DIAGNOSIS — E119 Type 2 diabetes mellitus without complications: Secondary | ICD-10-CM | POA: Diagnosis not present

## 2014-03-13 DIAGNOSIS — Z880 Allergy status to penicillin: Secondary | ICD-10-CM | POA: Diagnosis not present

## 2014-03-13 DIAGNOSIS — Z794 Long term (current) use of insulin: Secondary | ICD-10-CM | POA: Diagnosis not present

## 2014-04-10 DIAGNOSIS — M4726 Other spondylosis with radiculopathy, lumbar region: Secondary | ICD-10-CM | POA: Diagnosis not present

## 2014-04-10 DIAGNOSIS — M4722 Other spondylosis with radiculopathy, cervical region: Secondary | ICD-10-CM | POA: Diagnosis not present

## 2014-04-10 DIAGNOSIS — Z79891 Long term (current) use of opiate analgesic: Secondary | ICD-10-CM | POA: Diagnosis not present

## 2014-04-10 DIAGNOSIS — M5030 Other cervical disc degeneration, unspecified cervical region: Secondary | ICD-10-CM | POA: Diagnosis not present

## 2014-04-10 DIAGNOSIS — M5136 Other intervertebral disc degeneration, lumbar region: Secondary | ICD-10-CM | POA: Diagnosis not present

## 2014-04-15 ENCOUNTER — Other Ambulatory Visit: Payer: Self-pay | Admitting: Family Medicine

## 2014-04-17 ENCOUNTER — Telehealth: Payer: Self-pay

## 2014-04-17 ENCOUNTER — Telehealth: Payer: Self-pay | Admitting: *Deleted

## 2014-04-17 ENCOUNTER — Other Ambulatory Visit: Payer: Self-pay | Admitting: Family Medicine

## 2014-04-17 NOTE — Telephone Encounter (Signed)
Amber Faulkner called and reports she did make an appointment to follow up on DM. She would like her medications faxed to pharmacy. Prescriptions have been faxed.

## 2014-04-17 NOTE — Telephone Encounter (Signed)
Msg left that she needs to schedule an office diabetic follow up before her Lyrica and Soma script can be faxed to pharmacy. This is as per Dr. Madilyn Fireman and the signed scripts are on my desk waiting for her return call. Margette Fast, CMA

## 2014-04-29 DIAGNOSIS — M5137 Other intervertebral disc degeneration, lumbosacral region: Secondary | ICD-10-CM | POA: Diagnosis not present

## 2014-05-01 ENCOUNTER — Other Ambulatory Visit: Payer: Self-pay | Admitting: Family Medicine

## 2014-05-01 ENCOUNTER — Ambulatory Visit: Payer: Medicare Other | Admitting: Family Medicine

## 2014-05-21 ENCOUNTER — Other Ambulatory Visit: Payer: Self-pay | Admitting: *Deleted

## 2014-05-21 MED ORDER — QUETIAPINE FUMARATE ER 300 MG PO TB24: ORAL_TABLET | ORAL | Status: DC

## 2014-05-21 NOTE — Progress Notes (Signed)
Appt rescheduled as per patient for 05/29/14. Seroquel sent to pharmacy. Not sending Manuela Neptune as to her receiving this last month and it is a PRN med not a scheduled med. Dr. Madilyn Fireman aware. Margette Fast, RMA

## 2014-05-29 ENCOUNTER — Ambulatory Visit: Payer: Medicare Other | Admitting: Family Medicine

## 2014-05-29 DIAGNOSIS — Z0289 Encounter for other administrative examinations: Secondary | ICD-10-CM

## 2014-06-25 DIAGNOSIS — M5030 Other cervical disc degeneration, unspecified cervical region: Secondary | ICD-10-CM | POA: Diagnosis not present

## 2014-06-25 DIAGNOSIS — M5136 Other intervertebral disc degeneration, lumbar region: Secondary | ICD-10-CM | POA: Diagnosis not present

## 2014-06-25 DIAGNOSIS — M4726 Other spondylosis with radiculopathy, lumbar region: Secondary | ICD-10-CM | POA: Diagnosis not present

## 2014-06-25 DIAGNOSIS — Z79891 Long term (current) use of opiate analgesic: Secondary | ICD-10-CM | POA: Diagnosis not present

## 2014-06-25 DIAGNOSIS — M4722 Other spondylosis with radiculopathy, cervical region: Secondary | ICD-10-CM | POA: Diagnosis not present

## 2014-08-28 DIAGNOSIS — M4726 Other spondylosis with radiculopathy, lumbar region: Secondary | ICD-10-CM | POA: Diagnosis not present

## 2014-08-28 DIAGNOSIS — Z79891 Long term (current) use of opiate analgesic: Secondary | ICD-10-CM | POA: Diagnosis not present

## 2014-08-28 DIAGNOSIS — M5030 Other cervical disc degeneration, unspecified cervical region: Secondary | ICD-10-CM | POA: Diagnosis not present

## 2014-08-28 DIAGNOSIS — M4722 Other spondylosis with radiculopathy, cervical region: Secondary | ICD-10-CM | POA: Diagnosis not present

## 2014-08-28 DIAGNOSIS — M5136 Other intervertebral disc degeneration, lumbar region: Secondary | ICD-10-CM | POA: Diagnosis not present

## 2014-09-10 ENCOUNTER — Other Ambulatory Visit: Payer: Self-pay | Admitting: Family Medicine

## 2014-09-10 ENCOUNTER — Ambulatory Visit: Payer: Medicare Other | Admitting: Family Medicine

## 2014-09-15 ENCOUNTER — Other Ambulatory Visit: Payer: Self-pay | Admitting: Family Medicine

## 2014-09-18 ENCOUNTER — Other Ambulatory Visit: Payer: Self-pay | Admitting: Physician Assistant

## 2014-09-26 ENCOUNTER — Other Ambulatory Visit: Payer: Self-pay | Admitting: Physician Assistant

## 2014-09-26 ENCOUNTER — Encounter: Payer: Self-pay | Admitting: Family Medicine

## 2014-09-26 ENCOUNTER — Ambulatory Visit (INDEPENDENT_AMBULATORY_CARE_PROVIDER_SITE_OTHER): Payer: Medicare Other | Admitting: Family Medicine

## 2014-09-26 VITALS — BP 109/73 | HR 96 | Ht 65.0 in | Wt 200.0 lb

## 2014-09-26 DIAGNOSIS — E785 Hyperlipidemia, unspecified: Secondary | ICD-10-CM

## 2014-09-26 DIAGNOSIS — B1710 Acute hepatitis C without hepatic coma: Secondary | ICD-10-CM | POA: Diagnosis not present

## 2014-09-26 DIAGNOSIS — Z1159 Encounter for screening for other viral diseases: Secondary | ICD-10-CM

## 2014-09-26 DIAGNOSIS — E119 Type 2 diabetes mellitus without complications: Secondary | ICD-10-CM | POA: Diagnosis not present

## 2014-09-26 DIAGNOSIS — I10 Essential (primary) hypertension: Secondary | ICD-10-CM

## 2014-09-26 DIAGNOSIS — Z114 Encounter for screening for human immunodeficiency virus [HIV]: Secondary | ICD-10-CM | POA: Diagnosis not present

## 2014-09-26 LAB — POCT UA - MICROALBUMIN
Creatinine, POC: 100 mg/dL
MICROALBUMIN (UR) POC: 80 mg/L

## 2014-09-26 LAB — POCT GLYCOSYLATED HEMOGLOBIN (HGB A1C)

## 2014-09-26 MED ORDER — LISINOPRIL 20 MG PO TABS: ORAL_TABLET | ORAL | Status: DC

## 2014-09-26 MED ORDER — PREGABALIN 150 MG PO CAPS: 150.0000 mg | ORAL_CAPSULE | Freq: Two times a day (BID) | ORAL | Status: DC

## 2014-09-26 MED ORDER — VENLAFAXINE HCL ER 75 MG PO CP24: ORAL_CAPSULE | ORAL | Status: DC

## 2014-09-26 MED ORDER — INSULIN DETEMIR 100 UNIT/ML FLEXPEN: 40.0000 [IU] | PEN_INJECTOR | Freq: Every day | SUBCUTANEOUS | Status: DC

## 2014-09-26 MED ORDER — CARISOPRODOL 350 MG PO TABS: 350.0000 mg | ORAL_TABLET | Freq: Three times a day (TID) | ORAL | Status: DC

## 2014-09-26 MED ORDER — INSULIN LISPRO 100 UNIT/ML (KWIKPEN): 15.0000 [IU] | PEN_INJECTOR | Freq: Three times a day (TID) | SUBCUTANEOUS | Status: DC

## 2014-09-26 NOTE — Progress Notes (Signed)
   Subjective:    Patient ID: Amber Faulkner, female    DOB: 1960-08-16, 54 y.o.   MRN: [id number]  HPI Diabetes - no hypoglycemic events. No wounds or sores that are not healing well. No increased thirst or urination. Checking glucose at home. Taking medications as prescribed without any side effects. Has been out of her insulin for a week. Was using 21 units of levemir.  Givin  12-14 units of humalog.  6-9 times a day.   Has been very thristy. Has been drinking 4 quarts of water a day.  She has been off her metformin.  Says she does drink orange juice.    Hypertension- Pt denies chest pain, SOB, dizziness, or heart palpitations.  Taking meds as directed w/o problems.  Denies medication side effects.    Hyperlipidemia - Says taking her crestor and doing well.  No myalgias or S.e.    Having transportation issues and says that is why she hasn't been in. No longer has a car.   Review of Systems     Objective:   Physical Exam  Constitutional: She is oriented to person, place, and time. She appears well-developed and well-nourished.  HENT:  Head: Normocephalic and atraumatic.  Neck: Neck supple. No thyromegaly present.  Cardiovascular: Normal rate, regular rhythm and normal heart sounds.   Pulmonary/Chest: Effort normal and breath sounds normal.  Lymphadenopathy:    She has no cervical adenopathy.  Neurological: She is alert and oriented to person, place, and time.  Skin: Skin is warm and dry.  Psychiatric: She has a normal mood and affect. Her behavior is normal.          Assessment & Plan:  DM- Uncontrolled. Patient is not compliant with her medication esp her insulin. She is on a statin.  Will restart her metformin. Explained that this would make a big difference. We've actually refill her metformin for years so she should've had enough until May some not sure why she was not able to get this at the pharmacy for the last several months. We did give her some samples of Humalog today.  Encouraged her to go ahead and increase her Levemir to 40 units and then go up 2 units every other day until her blood sugars are under at least 200. Stressed the importance of getting her diabetes under control as this is causing long-term organ damage. Especially now that her A1c has been greater than 14 for almost a year.  HTN - Well controlled. Continue current regimen. Refilled lisinopril today.  Hyperlipidemia-tolerating Crestor well without any side effects or problems. It should go generic later this year. Due for CMP and fasting lipid panel. She will go today for blood work.

## 2014-09-27 LAB — LIPID PANEL
Cholesterol: 181 mg/dL (ref 0–200)
HDL: 56 mg/dL (ref >=46)
LDL Cholesterol: 99 mg/dL (ref 0–99)
Total CHOL/HDL Ratio: 3.2 Ratio
Triglycerides: 128 mg/dL (ref <150)
VLDL: 26 mg/dL (ref 0–40)

## 2014-09-27 LAB — COMPLETE METABOLIC PANEL WITH GFR
ALBUMIN: 4.4 g/dL (ref 3.5–5.2)
ALK PHOS: 151 U/L — AB (ref 39–117)
ALT: 13 U/L (ref 0–35)
AST: 12 U/L (ref 0–37)
BILIRUBIN TOTAL: 0.9 mg/dL (ref 0.2–1.2)
BUN: 16 mg/dL (ref 6–23)
CALCIUM: 9.4 mg/dL (ref 8.4–10.5)
CHLORIDE: 95 meq/L — AB (ref 96–112)
CO2: 20 mEq/L (ref 19–32)
CREATININE: 0.8 mg/dL (ref 0.50–1.10)
GFR, Est African American: >89 mL/min
GFR, Est Non African American: 86 mL/min
GLUCOSE: 498 mg/dL — AB (ref 70–99)
Potassium: 4.2 mEq/L (ref 3.5–5.3)
Sodium: 131 mEq/L — ABNORMAL LOW (ref 135–145)
Total Protein: 7.2 g/dL (ref 6.0–8.3)

## 2014-09-27 LAB — HIV ANTIBODY (ROUTINE TESTING W REFLEX): HIV: NONREACTIVE

## 2014-09-27 LAB — HEPATITIS C ANTIBODY: HCV Ab: REACTIVE — AB

## 2014-09-30 LAB — HEPATITIS C RNA QUANTITATIVE: HCV Quantitative: NOT DETECTED IU/mL (ref <15)

## 2014-10-01 ENCOUNTER — Other Ambulatory Visit: Payer: Self-pay | Admitting: *Deleted

## 2014-10-01 DIAGNOSIS — R748 Abnormal levels of other serum enzymes: Secondary | ICD-10-CM

## 2014-10-06 ENCOUNTER — Telehealth: Payer: Self-pay | Admitting: Family Medicine

## 2014-10-06 NOTE — Telephone Encounter (Signed)
Please call patient: I received a letter from her insurance company letting us know that they will not cover her Soma or her Humalog. I believe they did pay for it for a temporary supply. They did not send [place] a list of alternative drugs so she will need to call her insurance and find out what short acting insulin and what muscle relaxer they will pay for.

## 2014-10-07 NOTE — Telephone Encounter (Signed)
Left message w/pt's niece for rtn call.Amber Faulkner

## 2014-10-09 NOTE — Telephone Encounter (Signed)
Pt called and lvm with several #'s to contact her [phone] (left message to return call), [phone], [phone](lvm for rtn call).Amber Faulkner [place]

## 2014-10-09 NOTE — Telephone Encounter (Signed)
It is up to TID as needed. So 60 is plenty and should really last more than one month. I have never given 90 tabs.

## 2014-10-09 NOTE — Telephone Encounter (Signed)
Called and informed pt of what she will need to ask the insurance co in regards to her medications. Pt stated that her Amber Faulkner was only refilled for a 10 day supply she takes these tablets 3 times daily and she wanted to know if Dr. Madilyn Fireman would write for a 30 day supply. Pt reminded to come in to have labs done. She also said that she would like to have her niece's name removed as her EC.Amber Faulkner

## 2014-10-10 MED ORDER — CARISOPRODOL 350 MG PO TABS: 350.0000 mg | ORAL_TABLET | Freq: Two times a day (BID) | ORAL | Status: DC | PRN

## 2014-10-10 NOTE — Telephone Encounter (Signed)
When she came in she was only given 30 tabs.Amber Faulkner [place]

## 2014-10-10 NOTE — Telephone Encounter (Signed)
Sorry, yes usually gets 60 tabs per month.  Will refax new script for 60.

## 2014-10-23 ENCOUNTER — Ambulatory Visit: Payer: Medicare Other | Admitting: Family Medicine

## 2014-11-06 DIAGNOSIS — M4722 Other spondylosis with radiculopathy, cervical region: Secondary | ICD-10-CM | POA: Diagnosis not present

## 2014-11-06 DIAGNOSIS — M5136 Other intervertebral disc degeneration, lumbar region: Secondary | ICD-10-CM | POA: Diagnosis not present

## 2014-11-06 DIAGNOSIS — Z79891 Long term (current) use of opiate analgesic: Secondary | ICD-10-CM | POA: Diagnosis not present

## 2014-11-06 DIAGNOSIS — M4726 Other spondylosis with radiculopathy, lumbar region: Secondary | ICD-10-CM | POA: Diagnosis not present

## 2014-11-06 DIAGNOSIS — M5030 Other cervical disc degeneration, unspecified cervical region: Secondary | ICD-10-CM | POA: Diagnosis not present

## 2014-11-19 ENCOUNTER — Other Ambulatory Visit: Payer: Self-pay | Admitting: Family Medicine

## 2014-11-19 NOTE — Telephone Encounter (Signed)
Pt advised that she must make appt for future refills.Amber Faulkner, [name]

## 2014-11-21 ENCOUNTER — Telehealth: Payer: Self-pay | Admitting: Family Medicine

## 2014-11-21 NOTE — Telephone Encounter (Signed)
Attempted to contact Pt, her insurance does not want to cover her Amber Faulkner Rx but will cover Chlorzoxazone and Cyclobenzaprine. Need to know if Pt would like to try these, if so, which one.

## 2014-11-26 ENCOUNTER — Other Ambulatory Visit: Payer: Self-pay | Admitting: Family Medicine

## 2014-11-26 NOTE — Telephone Encounter (Signed)
Patient needs appointment with Provider for any further refills. Attempted to contact Pt, phone number on file was not active.

## 2014-12-05 DIAGNOSIS — E860 Dehydration: Secondary | ICD-10-CM | POA: Diagnosis not present

## 2014-12-05 DIAGNOSIS — D696 Thrombocytopenia, unspecified: Secondary | ICD-10-CM | POA: Diagnosis not present

## 2014-12-05 DIAGNOSIS — R748 Abnormal levels of other serum enzymes: Secondary | ICD-10-CM | POA: Diagnosis not present

## 2014-12-05 DIAGNOSIS — R031 Nonspecific low blood-pressure reading: Secondary | ICD-10-CM | POA: Diagnosis not present

## 2014-12-05 DIAGNOSIS — M329 Systemic lupus erythematosus, unspecified: Secondary | ICD-10-CM | POA: Diagnosis not present

## 2014-12-05 DIAGNOSIS — G35 Multiple sclerosis: Secondary | ICD-10-CM | POA: Diagnosis not present

## 2014-12-05 DIAGNOSIS — R531 Weakness: Secondary | ICD-10-CM | POA: Diagnosis not present

## 2014-12-05 DIAGNOSIS — R5382 Chronic fatigue, unspecified: Secondary | ICD-10-CM | POA: Diagnosis not present

## 2014-12-05 DIAGNOSIS — S301XXA Contusion of abdominal wall, initial encounter: Secondary | ICD-10-CM | POA: Diagnosis not present

## 2014-12-05 DIAGNOSIS — M797 Fibromyalgia: Secondary | ICD-10-CM | POA: Diagnosis not present

## 2014-12-05 DIAGNOSIS — I129 Hypertensive chronic kidney disease with stage 1 through stage 4 chronic kidney disease, or unspecified chronic kidney disease: Secondary | ICD-10-CM | POA: Diagnosis not present

## 2014-12-05 DIAGNOSIS — E872 Acidosis: Secondary | ICD-10-CM | POA: Diagnosis not present

## 2014-12-05 DIAGNOSIS — Z7982 Long term (current) use of aspirin: Secondary | ICD-10-CM | POA: Diagnosis not present

## 2014-12-05 DIAGNOSIS — R911 Solitary pulmonary nodule: Secondary | ICD-10-CM | POA: Diagnosis not present

## 2014-12-05 DIAGNOSIS — Z880 Allergy status to penicillin: Secondary | ICD-10-CM | POA: Diagnosis not present

## 2014-12-05 DIAGNOSIS — S0990XA Unspecified injury of head, initial encounter: Secondary | ICD-10-CM | POA: Diagnosis not present

## 2014-12-05 DIAGNOSIS — Z794 Long term (current) use of insulin: Secondary | ICD-10-CM | POA: Diagnosis not present

## 2014-12-05 DIAGNOSIS — Z883 Allergy status to other anti-infective agents status: Secondary | ICD-10-CM | POA: Diagnosis not present

## 2014-12-05 DIAGNOSIS — Z9181 History of falling: Secondary | ICD-10-CM | POA: Diagnosis not present

## 2014-12-05 DIAGNOSIS — N179 Acute kidney failure, unspecified: Secondary | ICD-10-CM | POA: Diagnosis not present

## 2014-12-05 DIAGNOSIS — R7989 Other specified abnormal findings of blood chemistry: Secondary | ICD-10-CM | POA: Diagnosis not present

## 2014-12-05 DIAGNOSIS — G894 Chronic pain syndrome: Secondary | ICD-10-CM | POA: Diagnosis not present

## 2014-12-05 DIAGNOSIS — F17210 Nicotine dependence, cigarettes, uncomplicated: Secondary | ICD-10-CM | POA: Diagnosis not present

## 2014-12-05 DIAGNOSIS — Z79899 Other long term (current) drug therapy: Secondary | ICD-10-CM | POA: Diagnosis not present

## 2014-12-05 DIAGNOSIS — T148 Other injury of unspecified body region: Secondary | ICD-10-CM | POA: Diagnosis not present

## 2014-12-05 DIAGNOSIS — R4182 Altered mental status, unspecified: Secondary | ICD-10-CM | POA: Diagnosis not present

## 2014-12-05 DIAGNOSIS — E1142 Type 2 diabetes mellitus with diabetic polyneuropathy: Secondary | ICD-10-CM | POA: Diagnosis not present

## 2014-12-05 DIAGNOSIS — Z888 Allergy status to other drugs, medicaments and biological substances status: Secondary | ICD-10-CM | POA: Diagnosis not present

## 2014-12-05 DIAGNOSIS — N189 Chronic kidney disease, unspecified: Secondary | ICD-10-CM | POA: Diagnosis not present

## 2014-12-06 DIAGNOSIS — E860 Dehydration: Secondary | ICD-10-CM | POA: Diagnosis not present

## 2014-12-07 DIAGNOSIS — N179 Acute kidney failure, unspecified: Secondary | ICD-10-CM | POA: Diagnosis not present

## 2014-12-07 DIAGNOSIS — G35 Multiple sclerosis: Secondary | ICD-10-CM | POA: Diagnosis not present

## 2014-12-07 DIAGNOSIS — G894 Chronic pain syndrome: Secondary | ICD-10-CM | POA: Diagnosis not present

## 2014-12-07 DIAGNOSIS — E860 Dehydration: Secondary | ICD-10-CM | POA: Diagnosis not present

## 2014-12-09 ENCOUNTER — Telehealth: Payer: Self-pay | Admitting: *Deleted
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
## 2014-12-18 ENCOUNTER — Encounter: Payer: Self-pay | Admitting: Family Medicine

## 2014-12-18 ENCOUNTER — Ambulatory Visit (INDEPENDENT_AMBULATORY_CARE_PROVIDER_SITE_OTHER): Payer: Medicare Other | Admitting: Family Medicine

## 2014-12-18 VITALS — BP 113/73 | HR 84 | Wt 196.0 lb

## 2014-12-18 DIAGNOSIS — E119 Type 2 diabetes mellitus without complications: Secondary | ICD-10-CM | POA: Diagnosis not present

## 2014-12-18 DIAGNOSIS — E1165 Type 2 diabetes mellitus with hyperglycemia: Secondary | ICD-10-CM | POA: Diagnosis not present

## 2014-12-18 DIAGNOSIS — G35 Multiple sclerosis: Secondary | ICD-10-CM | POA: Diagnosis not present

## 2014-12-18 DIAGNOSIS — G629 Polyneuropathy, unspecified: Secondary | ICD-10-CM | POA: Diagnosis not present

## 2014-12-18 DIAGNOSIS — D696 Thrombocytopenia, unspecified: Secondary | ICD-10-CM

## 2014-12-18 LAB — POCT GLYCOSYLATED HEMOGLOBIN (HGB A1C): HEMOGLOBIN A1C: 9.3

## 2014-12-18 MED ORDER — LISINOPRIL 20 MG PO TABS: ORAL_TABLET | ORAL | Status: DC

## 2014-12-18 MED ORDER — VENLAFAXINE HCL ER 75 MG PO CP24: ORAL_CAPSULE | ORAL | Status: DC

## 2014-12-18 MED ORDER — TRAMADOL HCL 50 MG PO TABS: 50.0000 mg | ORAL_TABLET | Freq: Three times a day (TID) | ORAL | Status: DC | PRN

## 2014-12-18 MED ORDER — PREGABALIN 150 MG PO CAPS: 150.0000 mg | ORAL_CAPSULE | Freq: Two times a day (BID) | ORAL | Status: DC

## 2014-12-18 MED ORDER — ROSUVASTATIN CALCIUM 20 MG PO TABS: ORAL_TABLET | ORAL | Status: DC
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
## 2014-12-19 ENCOUNTER — Telehealth: Payer: Self-pay | Admitting: Family Medicine

## 2014-12-19 ENCOUNTER — Encounter: Payer: Self-pay | Admitting: Family Medicine

## 2014-12-19 LAB — CBC
HEMATOCRIT: 40.5 % (ref 36.0–46.0)
Hemoglobin: 13.1 g/dL (ref 12.0–15.0)
MCH: 29.3 pg (ref 26.0–34.0)
MCHC: 32.3 g/dL (ref 30.0–36.0)
MCV: 90.6 fL (ref 78.0–100.0)
MPV: 10.4 fL (ref 8.6–12.4)
PLATELETS: 73 10*3/uL — AB (ref 150–400)
RBC: 4.47 MIL/uL (ref 3.87–5.11)
RDW: 15.9 % — ABNORMAL HIGH (ref 11.5–15.5)
WBC: 8.1 10*3/uL (ref 4.0–10.5)

## 2014-12-19 NOTE — Telephone Encounter (Signed)
Please call patient: I did not see her labs come across this morning. I just wanted to make sure that she was able to go. It's extremely important that we recheck her lab work from the hospitalization.

## 2014-12-21 DIAGNOSIS — G35 Multiple sclerosis: Secondary | ICD-10-CM | POA: Diagnosis not present

## 2014-12-21 DIAGNOSIS — E1140 Type 2 diabetes mellitus with diabetic neuropathy, unspecified: Secondary | ICD-10-CM | POA: Diagnosis not present

## 2014-12-21 DIAGNOSIS — I10 Essential (primary) hypertension: Secondary | ICD-10-CM | POA: Diagnosis not present

## 2014-12-21 DIAGNOSIS — Z9181 History of falling: Secondary | ICD-10-CM | POA: Diagnosis not present

## 2014-12-21 DIAGNOSIS — M47895 Other spondylosis, thoracolumbar region: Secondary | ICD-10-CM | POA: Diagnosis not present

## 2014-12-21 DIAGNOSIS — F17210 Nicotine dependence, cigarettes, uncomplicated: Secondary | ICD-10-CM | POA: Diagnosis not present

## 2014-12-21 DIAGNOSIS — M47892 Other spondylosis, cervical region: Secondary | ICD-10-CM | POA: Diagnosis not present

## 2014-12-21 DIAGNOSIS — D696 Thrombocytopenia, unspecified: Secondary | ICD-10-CM | POA: Diagnosis not present

## 2014-12-21 DIAGNOSIS — Z794 Long term (current) use of insulin: Secondary | ICD-10-CM | POA: Diagnosis not present

## 2014-12-21 DIAGNOSIS — G8929 Other chronic pain: Secondary | ICD-10-CM | POA: Diagnosis not present

## 2014-12-22 DIAGNOSIS — E1140 Type 2 diabetes mellitus with diabetic neuropathy, unspecified: Secondary | ICD-10-CM | POA: Diagnosis not present

## 2014-12-22 DIAGNOSIS — M47895 Other spondylosis, thoracolumbar region: Secondary | ICD-10-CM | POA: Diagnosis not present

## 2014-12-22 DIAGNOSIS — M47892 Other spondylosis, cervical region: Secondary | ICD-10-CM | POA: Diagnosis not present

## 2014-12-22 DIAGNOSIS — I10 Essential (primary) hypertension: Secondary | ICD-10-CM | POA: Diagnosis not present

## 2014-12-22 DIAGNOSIS — G8929 Other chronic pain: Secondary | ICD-10-CM | POA: Diagnosis not present

## 2014-12-22 DIAGNOSIS — G35 Multiple sclerosis: Secondary | ICD-10-CM | POA: Diagnosis not present

## 2014-12-23 ENCOUNTER — Telehealth: Payer: Self-pay | Admitting: *Deleted

## 2014-12-23 DIAGNOSIS — G8929 Other chronic pain: Secondary | ICD-10-CM | POA: Diagnosis not present

## 2014-12-23 DIAGNOSIS — G35 Multiple sclerosis: Secondary | ICD-10-CM | POA: Diagnosis not present

## 2014-12-23 DIAGNOSIS — M47892 Other spondylosis, cervical region: Secondary | ICD-10-CM | POA: Diagnosis not present

## 2014-12-23 DIAGNOSIS — E1140 Type 2 diabetes mellitus with diabetic neuropathy, unspecified: Secondary | ICD-10-CM | POA: Diagnosis not present

## 2014-12-23 DIAGNOSIS — M47895 Other spondylosis, thoracolumbar region: Secondary | ICD-10-CM | POA: Diagnosis not present

## 2014-12-23 DIAGNOSIS — I10 Essential (primary) hypertension: Secondary | ICD-10-CM | POA: Diagnosis not present

## 2014-12-23 NOTE — Telephone Encounter (Signed)
June w/[facility] lvm asking for VO for PT.Amber Faulkner, [name]

## 2014-12-23 NOTE — Telephone Encounter (Signed)
Called and gave VO for PT.Audelia Hives [place]

## 2014-12-24 ENCOUNTER — Telehealth: Payer: Self-pay | Admitting: Family Medicine

## 2014-12-24 DIAGNOSIS — E1140 Type 2 diabetes mellitus with diabetic neuropathy, unspecified: Secondary | ICD-10-CM | POA: Diagnosis not present

## 2014-12-24 DIAGNOSIS — M47895 Other spondylosis, thoracolumbar region: Secondary | ICD-10-CM | POA: Diagnosis not present

## 2014-12-24 DIAGNOSIS — F17200 Nicotine dependence, unspecified, uncomplicated: Secondary | ICD-10-CM

## 2014-12-24 DIAGNOSIS — R911 Solitary pulmonary nodule: Secondary | ICD-10-CM

## 2014-12-24 DIAGNOSIS — R918 Other nonspecific abnormal finding of lung field: Secondary | ICD-10-CM

## 2014-12-24 DIAGNOSIS — M47892 Other spondylosis, cervical region: Secondary | ICD-10-CM | POA: Diagnosis not present

## 2014-12-24 DIAGNOSIS — G35 Multiple sclerosis: Secondary | ICD-10-CM | POA: Diagnosis not present

## 2014-12-24 DIAGNOSIS — G8929 Other chronic pain: Secondary | ICD-10-CM | POA: Diagnosis not present

## 2014-12-24 DIAGNOSIS — I10 Essential (primary) hypertension: Secondary | ICD-10-CM | POA: Diagnosis not present

## 2014-12-24 NOTE — Telephone Encounter (Signed)
Please call patient: I had a chance to review her CT that was done at the hospital. She did have some nodules with the largest measuring about 9 mm in the left lower lobe.  . This is just less than 1 cm. Based on current guidelines they recommend either getting a CT with contrast or a PET scan. Because of her smoking history I do think it's important to evaluate this further. If she is okay with moving forward with additional imaging then please let me know.

## 2014-12-24 NOTE — Telephone Encounter (Signed)
Labs done pt given results.Amber Faulkner

## 2014-12-25 ENCOUNTER — Other Ambulatory Visit: Payer: Self-pay | Admitting: *Deleted

## 2014-12-25 DIAGNOSIS — D696 Thrombocytopenia, unspecified: Secondary | ICD-10-CM | POA: Diagnosis not present

## 2014-12-25 DIAGNOSIS — R748 Abnormal levels of other serum enzymes: Secondary | ICD-10-CM | POA: Diagnosis not present

## 2014-12-25 NOTE — Telephone Encounter (Signed)
Pt informed of results and recommendations. She would like to proceed with additional imaging.Audelia Hives [place]

## 2014-12-27 LAB — BASIC METABOLIC PANEL
BUN: 19 mg/dL (ref 6–23)
CALCIUM: 8.6 mg/dL (ref 8.4–10.5)
CO2: 21 mEq/L (ref 19–32)
Chloride: 105 mEq/L (ref 96–112)
Creat: 0.51 mg/dL (ref 0.50–1.10)
GLUCOSE: 135 mg/dL — AB (ref 70–99)
Potassium: 3.7 mEq/L (ref 3.5–5.3)
SODIUM: 139 meq/L (ref 135–145)

## 2014-12-27 LAB — ALKALINE PHOSPHATASE: Alkaline Phosphatase: 101 U/L (ref 39–117)

## 2014-12-29 NOTE — Telephone Encounter (Signed)
PET scan ordered 

## 2014-12-30 ENCOUNTER — Telehealth: Payer: Self-pay | Admitting: Family Medicine

## 2014-12-30 DIAGNOSIS — M47892 Other spondylosis, cervical region: Secondary | ICD-10-CM | POA: Diagnosis not present

## 2014-12-30 DIAGNOSIS — I10 Essential (primary) hypertension: Secondary | ICD-10-CM | POA: Diagnosis not present

## 2014-12-30 DIAGNOSIS — E1140 Type 2 diabetes mellitus with diabetic neuropathy, unspecified: Secondary | ICD-10-CM | POA: Diagnosis not present

## 2014-12-30 DIAGNOSIS — G35 Multiple sclerosis: Secondary | ICD-10-CM | POA: Diagnosis not present

## 2014-12-30 NOTE — Telephone Encounter (Signed)
Left message for Pt, Nuclear Medicine PET scan set for 01/06/15 at 0800. Pt need to arrive at [facility] at 0730. Pt is to be NPO at midnight the night before. Provided call back information if Pt has any questions, also left scheduling phone number [phone]) if she needs to change the appt.

## 2014-12-31 DIAGNOSIS — M47892 Other spondylosis, cervical region: Secondary | ICD-10-CM | POA: Diagnosis not present

## 2014-12-31 DIAGNOSIS — E1140 Type 2 diabetes mellitus with diabetic neuropathy, unspecified: Secondary | ICD-10-CM | POA: Diagnosis not present

## 2014-12-31 DIAGNOSIS — M47895 Other spondylosis, thoracolumbar region: Secondary | ICD-10-CM | POA: Diagnosis not present

## 2014-12-31 DIAGNOSIS — G35 Multiple sclerosis: Secondary | ICD-10-CM | POA: Diagnosis not present

## 2014-12-31 DIAGNOSIS — I10 Essential (primary) hypertension: Secondary | ICD-10-CM | POA: Diagnosis not present

## 2014-12-31 DIAGNOSIS — G8929 Other chronic pain: Secondary | ICD-10-CM | POA: Diagnosis not present

## 2015-01-01 DIAGNOSIS — I10 Essential (primary) hypertension: Secondary | ICD-10-CM | POA: Diagnosis not present

## 2015-01-01 DIAGNOSIS — G35 Multiple sclerosis: Secondary | ICD-10-CM | POA: Diagnosis not present

## 2015-01-01 DIAGNOSIS — G8929 Other chronic pain: Secondary | ICD-10-CM | POA: Diagnosis not present

## 2015-01-01 DIAGNOSIS — M47892 Other spondylosis, cervical region: Secondary | ICD-10-CM | POA: Diagnosis not present

## 2015-01-01 DIAGNOSIS — M47895 Other spondylosis, thoracolumbar region: Secondary | ICD-10-CM | POA: Diagnosis not present

## 2015-01-01 DIAGNOSIS — E1140 Type 2 diabetes mellitus with diabetic neuropathy, unspecified: Secondary | ICD-10-CM | POA: Diagnosis not present

## 2015-01-02 DIAGNOSIS — G8929 Other chronic pain: Secondary | ICD-10-CM | POA: Diagnosis not present

## 2015-01-02 DIAGNOSIS — I10 Essential (primary) hypertension: Secondary | ICD-10-CM | POA: Diagnosis not present

## 2015-01-02 DIAGNOSIS — M47895 Other spondylosis, thoracolumbar region: Secondary | ICD-10-CM | POA: Diagnosis not present

## 2015-01-02 DIAGNOSIS — M47892 Other spondylosis, cervical region: Secondary | ICD-10-CM | POA: Diagnosis not present

## 2015-01-02 DIAGNOSIS — G35 Multiple sclerosis: Secondary | ICD-10-CM | POA: Diagnosis not present

## 2015-01-02 DIAGNOSIS — E1140 Type 2 diabetes mellitus with diabetic neuropathy, unspecified: Secondary | ICD-10-CM | POA: Diagnosis not present

## 2015-01-05 ENCOUNTER — Encounter: Payer: Self-pay | Admitting: Family Medicine

## 2015-01-06 ENCOUNTER — Encounter (HOSPITAL_COMMUNITY)
Admission: RE | Admit: 2015-01-06 | Discharge: 2015-01-06 | Disposition: A | Discharge status: Home / Self Care | Payer: Medicare Other | Source: Ambulatory Visit | Attending: Family Medicine | Admitting: Family Medicine

## 2015-01-06 ENCOUNTER — Encounter (HOSPITAL_COMMUNITY): Payer: Self-pay

## 2015-01-06 DIAGNOSIS — M47895 Other spondylosis, thoracolumbar region: Secondary | ICD-10-CM | POA: Diagnosis not present

## 2015-01-06 DIAGNOSIS — Z79899 Other long term (current) drug therapy: Secondary | ICD-10-CM | POA: Diagnosis not present

## 2015-01-06 DIAGNOSIS — G8929 Other chronic pain: Secondary | ICD-10-CM | POA: Diagnosis not present

## 2015-01-06 DIAGNOSIS — E1140 Type 2 diabetes mellitus with diabetic neuropathy, unspecified: Secondary | ICD-10-CM | POA: Diagnosis not present

## 2015-01-06 DIAGNOSIS — R918 Other nonspecific abnormal finding of lung field: Secondary | ICD-10-CM

## 2015-01-06 DIAGNOSIS — R911 Solitary pulmonary nodule: Secondary | ICD-10-CM | POA: Diagnosis present

## 2015-01-06 DIAGNOSIS — M47892 Other spondylosis, cervical region: Secondary | ICD-10-CM | POA: Diagnosis not present

## 2015-01-06 DIAGNOSIS — F17200 Nicotine dependence, unspecified, uncomplicated: Secondary | ICD-10-CM | POA: Diagnosis not present

## 2015-01-06 DIAGNOSIS — I10 Essential (primary) hypertension: Secondary | ICD-10-CM | POA: Diagnosis not present

## 2015-01-06 DIAGNOSIS — G35 Multiple sclerosis: Secondary | ICD-10-CM | POA: Diagnosis not present

## 2015-01-06 LAB — GLUCOSE, CAPILLARY: Glucose-Capillary: 133 mg/dL — ABNORMAL HIGH (ref 65–99)

## 2015-01-06 MED ORDER — FLUDEOXYGLUCOSE F - 18 (FDG) INJECTION
10.2000 | Freq: Once | INTRAVENOUS | Status: AC | PRN
  Administered 2015-01-06: 10.2 via INTRAVENOUS

## 2015-01-08 DIAGNOSIS — G35 Multiple sclerosis: Secondary | ICD-10-CM | POA: Diagnosis not present

## 2015-01-08 DIAGNOSIS — I10 Essential (primary) hypertension: Secondary | ICD-10-CM | POA: Diagnosis not present

## 2015-01-08 DIAGNOSIS — M47892 Other spondylosis, cervical region: Secondary | ICD-10-CM | POA: Diagnosis not present

## 2015-01-08 DIAGNOSIS — M47895 Other spondylosis, thoracolumbar region: Secondary | ICD-10-CM | POA: Diagnosis not present

## 2015-01-08 DIAGNOSIS — E1140 Type 2 diabetes mellitus with diabetic neuropathy, unspecified: Secondary | ICD-10-CM | POA: Diagnosis not present

## 2015-01-08 DIAGNOSIS — G8929 Other chronic pain: Secondary | ICD-10-CM | POA: Diagnosis not present

## 2015-01-12 DIAGNOSIS — M47892 Other spondylosis, cervical region: Secondary | ICD-10-CM | POA: Diagnosis not present

## 2015-01-12 DIAGNOSIS — I10 Essential (primary) hypertension: Secondary | ICD-10-CM | POA: Diagnosis not present

## 2015-01-12 DIAGNOSIS — M47895 Other spondylosis, thoracolumbar region: Secondary | ICD-10-CM | POA: Diagnosis not present

## 2015-01-12 DIAGNOSIS — G35 Multiple sclerosis: Secondary | ICD-10-CM | POA: Diagnosis not present

## 2015-01-12 DIAGNOSIS — G8929 Other chronic pain: Secondary | ICD-10-CM | POA: Diagnosis not present

## 2015-01-12 DIAGNOSIS — E1140 Type 2 diabetes mellitus with diabetic neuropathy, unspecified: Secondary | ICD-10-CM | POA: Diagnosis not present

## 2015-01-15 ENCOUNTER — Ambulatory Visit: Payer: Medicare Other | Admitting: Family Medicine

## 2015-01-16 ENCOUNTER — Telehealth: Payer: Self-pay | Admitting: *Deleted

## 2015-01-16 NOTE — Telephone Encounter (Signed)
June PT with [facility] called and lvm stated that he will be d/cing her care due to her not responding. If we have any questions to call.Audelia Hives [place]

## 2015-01-21 DIAGNOSIS — M5136 Other intervertebral disc degeneration, lumbar region: Secondary | ICD-10-CM | POA: Diagnosis not present

## 2015-01-21 DIAGNOSIS — M4722 Other spondylosis with radiculopathy, cervical region: Secondary | ICD-10-CM | POA: Diagnosis not present

## 2015-01-21 DIAGNOSIS — Z79891 Long term (current) use of opiate analgesic: Secondary | ICD-10-CM | POA: Diagnosis not present

## 2015-01-21 DIAGNOSIS — M5030 Other cervical disc degeneration, unspecified cervical region: Secondary | ICD-10-CM | POA: Diagnosis not present

## 2015-01-21 DIAGNOSIS — M4726 Other spondylosis with radiculopathy, lumbar region: Secondary | ICD-10-CM | POA: Diagnosis not present

## 2015-01-30 ENCOUNTER — Other Ambulatory Visit: Payer: Self-pay | Admitting: Family Medicine

## 2015-01-30 NOTE — Telephone Encounter (Signed)
Opened to review care everywhere.Amber Faulkner [place]

## 2015-02-02 ENCOUNTER — Telehealth: Payer: Self-pay | Admitting: *Deleted

## 2015-02-02 NOTE — Telephone Encounter (Signed)
Pt called today wanting to know if you'd refill her soma.  I wanted to ask you first since she takes seroquel, even though it's for different things. Please advise.

## 2015-02-03 ENCOUNTER — Telehealth: Payer: Self-pay | Admitting: Family Medicine

## 2015-02-03 NOTE — Telephone Encounter (Signed)
Received fax for prior authorization on Seroquel sent through cover my meds waiting on authorization. - CF

## 2015-02-03 NOTE — Telephone Encounter (Signed)
Received form from [place] filled it out and got Dr. Gardiner Ramus signature. Faxed to [facility] waiting on authorization. - CF

## 2015-02-03 NOTE — Telephone Encounter (Signed)
I declined the soma. She needs to be getting from her pain management.

## 2015-02-04 ENCOUNTER — Encounter: Payer: Self-pay | Admitting: Family Medicine

## 2015-02-04 DIAGNOSIS — F4310 Post-traumatic stress disorder, unspecified: Secondary | ICD-10-CM | POA: Insufficient documentation

## 2015-02-06 ENCOUNTER — Other Ambulatory Visit: Payer: Self-pay | Admitting: Family Medicine

## 2015-02-09 DIAGNOSIS — M9971 Connective tissue and disc stenosis of intervertebral foramina of cervical region: Secondary | ICD-10-CM | POA: Diagnosis not present

## 2015-02-09 DIAGNOSIS — M779 Enthesopathy, unspecified: Secondary | ICD-10-CM | POA: Diagnosis not present

## 2015-02-09 DIAGNOSIS — M25712 Osteophyte, left shoulder: Secondary | ICD-10-CM | POA: Diagnosis not present

## 2015-02-09 DIAGNOSIS — M4712 Other spondylosis with myelopathy, cervical region: Secondary | ICD-10-CM | POA: Diagnosis not present

## 2015-02-09 DIAGNOSIS — M25711 Osteophyte, right shoulder: Secondary | ICD-10-CM | POA: Diagnosis not present

## 2015-02-09 NOTE — Telephone Encounter (Signed)
Received fax from [place] and seroquel has been approved from 07/09/14 - 07/11/15 referral # [id number]. - CF

## 2015-02-10 ENCOUNTER — Telehealth: Payer: Self-pay | Admitting: Family Medicine

## 2015-02-10 NOTE — Telephone Encounter (Signed)
Amber Faulkner, can you try to call her and see what's going on. I had noted that they had tried to get out to her home several times and she has not been available.

## 2015-02-10 NOTE — Telephone Encounter (Signed)
Ronalee Belts- The rep from [facility] came by to see when the last time this patient came in for appt. Because patient has missed her last several [place] appts and they can not get her by phone. I also noticed someone cancelled her appointment with Dr. Madilyn Fireman for July. Ronalee Belts just wanted to give Dr.Metheney a FYI. Thanks

## 2015-02-11 ENCOUNTER — Other Ambulatory Visit: Payer: Self-pay | Admitting: *Deleted

## 2015-02-11 ENCOUNTER — Other Ambulatory Visit: Payer: Self-pay | Admitting: Family Medicine

## 2015-02-11 MED ORDER — QUETIAPINE FUMARATE ER 300 MG PO TB24: ORAL_TABLET | ORAL | Status: DC

## 2015-03-04 ENCOUNTER — Other Ambulatory Visit: Payer: Self-pay | Admitting: Family Medicine

## 2015-03-12 DIAGNOSIS — Z79891 Long term (current) use of opiate analgesic: Secondary | ICD-10-CM | POA: Diagnosis not present

## 2015-03-12 DIAGNOSIS — M5136 Other intervertebral disc degeneration, lumbar region: Secondary | ICD-10-CM | POA: Diagnosis not present

## 2015-03-12 DIAGNOSIS — M5030 Other cervical disc degeneration, unspecified cervical region: Secondary | ICD-10-CM | POA: Diagnosis not present

## 2015-03-12 DIAGNOSIS — M4722 Other spondylosis with radiculopathy, cervical region: Secondary | ICD-10-CM | POA: Diagnosis not present

## 2015-03-12 DIAGNOSIS — M4726 Other spondylosis with radiculopathy, lumbar region: Secondary | ICD-10-CM | POA: Diagnosis not present

## 2015-04-03 ENCOUNTER — Telehealth: Payer: Self-pay | Admitting: Family Medicine

## 2015-04-03 DIAGNOSIS — G35 Multiple sclerosis: Secondary | ICD-10-CM
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
## 2015-04-06 NOTE — Telephone Encounter (Signed)
I think the best solution would be to get her in with a neurologist. Referral placed. I did not hit sign as she may have a preference.please enter and sign,.

## 2015-04-06 NOTE — Telephone Encounter (Signed)
Attempted to contact Pt to see what pain clinic she is going to and who her neurologist is currently. Had to leave a voicemail, provided callback information.

## 2015-04-06 NOTE — Telephone Encounter (Signed)
Patient does not have a neurologist. Dr Louretta Shorten with [facility] ordered a MRI neck and an old stroke was seen on imaging. I will call for the report.    Kriste Basque MD   Address: [address] # [place], [place], [place] [address]  Phone: [phone]

## 2015-04-06 NOTE — Telephone Encounter (Signed)
Let call her neurologist to get notes.

## 2015-04-07 NOTE — Telephone Encounter (Signed)
Left message advising of recommendations.  

## 2015-04-08 DIAGNOSIS — M5126 Other intervertebral disc displacement, lumbar region: Secondary | ICD-10-CM | POA: Diagnosis not present

## 2015-04-08 DIAGNOSIS — M47816 Spondylosis without myelopathy or radiculopathy, lumbar region: Secondary | ICD-10-CM | POA: Diagnosis not present

## 2015-04-08 DIAGNOSIS — M4806 Spinal stenosis, lumbar region: Secondary | ICD-10-CM | POA: Diagnosis not present

## 2015-04-08 DIAGNOSIS — M47817 Spondylosis without myelopathy or radiculopathy, lumbosacral region: Secondary | ICD-10-CM | POA: Diagnosis not present

## 2015-04-10 ENCOUNTER — Other Ambulatory Visit: Payer: Self-pay | Admitting: Family Medicine

## 2015-04-14 DIAGNOSIS — M4712 Other spondylosis with myelopathy, cervical region: Secondary | ICD-10-CM | POA: Diagnosis not present

## 2015-04-14 DIAGNOSIS — F17210 Nicotine dependence, cigarettes, uncomplicated: Secondary | ICD-10-CM | POA: Diagnosis not present

## 2015-04-14 DIAGNOSIS — G35 Multiple sclerosis: Secondary | ICD-10-CM | POA: Diagnosis not present

## 2015-04-14 DIAGNOSIS — M4802 Spinal stenosis, cervical region: Secondary | ICD-10-CM | POA: Diagnosis not present

## 2015-04-14 DIAGNOSIS — Z881 Allergy status to other antibiotic agents status: Secondary | ICD-10-CM | POA: Diagnosis not present

## 2015-04-14 DIAGNOSIS — Z880 Allergy status to penicillin: Secondary | ICD-10-CM | POA: Diagnosis not present

## 2015-04-14 DIAGNOSIS — G9520 Unspecified cord compression: Secondary | ICD-10-CM | POA: Insufficient documentation

## 2015-04-14 DIAGNOSIS — I6522 Occlusion and stenosis of left carotid artery: Secondary | ICD-10-CM | POA: Diagnosis not present

## 2015-04-16 ENCOUNTER — Telehealth: Payer: Self-pay | Admitting: *Deleted

## 2015-04-16 MED ORDER — NICOTINE 21 MG/24HR TD PT24: 21.0000 mg | MEDICATED_PATCH | Freq: Every day | TRANSDERMAL | Status: DC

## 2015-04-16 NOTE — Telephone Encounter (Signed)
Pt called and stated that the neurologist never called and she doesn't know who she was referred to. Pt informed that this will need to be redone due to her needing to be seen by someone in [place]. When she was in the hospital she was told that she will no longer need bp meds because this was endangering her and she actually d/c'd this and her bp's have been doing fine. And will no longer need Dr. Madilyn Fireman to send Rx's for these. She is going to be having surgery on her neck and will need to stop smoking and would like a nicotine patch to be sent to her pharmacy [facility]. Maryruth Eve, [name]

## 2015-04-20 DIAGNOSIS — J449 Chronic obstructive pulmonary disease, unspecified: Secondary | ICD-10-CM | POA: Diagnosis not present

## 2015-04-20 DIAGNOSIS — M4712 Other spondylosis with myelopathy, cervical region: Secondary | ICD-10-CM | POA: Diagnosis not present

## 2015-04-20 DIAGNOSIS — Z01818 Encounter for other preprocedural examination: Secondary | ICD-10-CM | POA: Diagnosis not present

## 2015-04-20 DIAGNOSIS — R0789 Other chest pain: Secondary | ICD-10-CM | POA: Diagnosis not present

## 2015-04-20 DIAGNOSIS — Z794 Long term (current) use of insulin: Secondary | ICD-10-CM | POA: Diagnosis not present

## 2015-04-20 DIAGNOSIS — E119 Type 2 diabetes mellitus without complications: Secondary | ICD-10-CM | POA: Diagnosis not present

## 2015-04-20 DIAGNOSIS — J988 Other specified respiratory disorders: Secondary | ICD-10-CM | POA: Diagnosis not present

## 2015-04-21 ENCOUNTER — Telehealth: Payer: Self-pay | Admitting: *Deleted

## 2015-04-21 DIAGNOSIS — T80319A ABO incompatibility with hemolytic transfusion reaction, unspecified, initial encounter: Secondary | ICD-10-CM | POA: Diagnosis not present

## 2015-04-21 DIAGNOSIS — J449 Chronic obstructive pulmonary disease, unspecified: Secondary | ICD-10-CM | POA: Insufficient documentation

## 2015-04-21 NOTE — Telephone Encounter (Signed)
Dr. Oley Balm called and lvm for Dr. Madilyn Fireman asking that she call him back to discuss coordinating Ms. Yetman's care. He stated that he is on inpatient services and that he could be reached by calling his cell @ [phone] or his office @ [phone] and it would be ok to leave a VM. Will fwd to pcp for f/u.Amber Faulkner

## 2015-04-22 NOTE — Telephone Encounter (Signed)
Called. MD in middle of rounds. He will call back later today.

## 2015-05-09 ENCOUNTER — Other Ambulatory Visit: Payer: Self-pay | Admitting: Family Medicine

## 2015-05-14 DIAGNOSIS — M5136 Other intervertebral disc degeneration, lumbar region: Secondary | ICD-10-CM | POA: Diagnosis not present

## 2015-05-14 DIAGNOSIS — M4722 Other spondylosis with radiculopathy, cervical region: Secondary | ICD-10-CM | POA: Diagnosis not present

## 2015-05-14 DIAGNOSIS — M4726 Other spondylosis with radiculopathy, lumbar region: Secondary | ICD-10-CM | POA: Diagnosis not present

## 2015-05-14 DIAGNOSIS — M5030 Other cervical disc degeneration, unspecified cervical region: Secondary | ICD-10-CM | POA: Diagnosis not present

## 2015-05-14 DIAGNOSIS — Z79891 Long term (current) use of opiate analgesic: Secondary | ICD-10-CM | POA: Diagnosis not present

## 2015-05-29 ENCOUNTER — Telehealth: Payer: Self-pay | Admitting: Emergency Medicine

## 2015-05-29 NOTE — Telephone Encounter (Signed)
Called and left message 05/28/15 that she needed rx for left knee brace, back brace, and wanted rx for muscle spasms. Called her this a.m. and suggested appt today for all of her concerns. pak

## 2015-06-05 ENCOUNTER — Other Ambulatory Visit: Payer: Self-pay | Admitting: Family Medicine

## 2015-06-08 NOTE — Telephone Encounter (Signed)
LMOVM to call for an appointment.

## 2015-06-25 ENCOUNTER — Other Ambulatory Visit: Payer: Self-pay

## 2015-06-25 MED ORDER — PREGABALIN 150 MG PO CAPS: 150.0000 mg | ORAL_CAPSULE | Freq: Two times a day (BID) | ORAL | Status: DC

## 2015-06-25 MED ORDER — ROSUVASTATIN CALCIUM 20 MG PO TABS: ORAL_TABLET | ORAL | Status: DC

## 2015-06-25 MED ORDER — VENLAFAXINE HCL ER 75 MG PO CP24: ORAL_CAPSULE | ORAL | Status: DC

## 2015-06-25 MED ORDER — CARISOPRODOL 350 MG PO TABS: 350.0000 mg | ORAL_TABLET | Freq: Two times a day (BID) | ORAL | Status: DC | PRN

## 2015-06-26 DIAGNOSIS — M4722 Other spondylosis with radiculopathy, cervical region: Secondary | ICD-10-CM | POA: Diagnosis not present

## 2015-06-26 DIAGNOSIS — M4726 Other spondylosis with radiculopathy, lumbar region: Secondary | ICD-10-CM | POA: Diagnosis not present

## 2015-06-26 DIAGNOSIS — M5136 Other intervertebral disc degeneration, lumbar region: Secondary | ICD-10-CM | POA: Diagnosis not present

## 2015-06-26 DIAGNOSIS — M5030 Other cervical disc degeneration, unspecified cervical region: Secondary | ICD-10-CM | POA: Diagnosis not present

## 2015-06-26 DIAGNOSIS — Z79891 Long term (current) use of opiate analgesic: Secondary | ICD-10-CM | POA: Diagnosis not present

## 2015-07-16 ENCOUNTER — Encounter: Payer: Self-pay | Admitting: Family Medicine

## 2015-07-16 ENCOUNTER — Ambulatory Visit (INDEPENDENT_AMBULATORY_CARE_PROVIDER_SITE_OTHER): Payer: Medicare Other | Admitting: Family Medicine

## 2015-07-16 VITALS — BP 137/68 | HR 85 | Ht 65.0 in | Wt 211.0 lb

## 2015-07-16 DIAGNOSIS — Z1211 Encounter for screening for malignant neoplasm of colon: Secondary | ICD-10-CM | POA: Diagnosis not present

## 2015-07-16 DIAGNOSIS — R413 Other amnesia: Secondary | ICD-10-CM

## 2015-07-16 DIAGNOSIS — Z8673 Personal history of transient ischemic attack (TIA), and cerebral infarction without residual deficits: Secondary | ICD-10-CM | POA: Insufficient documentation

## 2015-07-16 DIAGNOSIS — F17200 Nicotine dependence, unspecified, uncomplicated: Secondary | ICD-10-CM

## 2015-07-16 DIAGNOSIS — E119 Type 2 diabetes mellitus without complications: Secondary | ICD-10-CM | POA: Diagnosis not present

## 2015-07-16 DIAGNOSIS — T23209A Burn of second degree of unspecified hand, unspecified site, initial encounter: Secondary | ICD-10-CM | POA: Diagnosis not present

## 2015-07-16 LAB — POCT GLYCOSYLATED HEMOGLOBIN (HGB A1C): Hemoglobin A1C: 7.3

## 2015-07-16 MED ORDER — ROSUVASTATIN CALCIUM 20 MG PO TABS: ORAL_TABLET | ORAL | Status: DC

## 2015-07-16 MED ORDER — CYCLOBENZAPRINE HCL 10 MG PO TABS: 10.0000 mg | ORAL_TABLET | Freq: Every evening | ORAL | Status: DC | PRN
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
## 2015-07-16 NOTE — Addendum Note (Signed)
Addended by: Beatris Ship L on: 07/16/2015 03:46 PM   Modules accepted: Orders

## 2015-07-16 NOTE — Progress Notes (Addendum)
   Subjective:    Patient ID: Amber Faulkner, female    DOB: 1960/09/18, 55 y.o.   MRN: [id number]  HPI Diabetes - no hypoglycemic events. No wounds or sores that are not healing well. No increased thirst or urination. Checking glucose at home. Taking medications as prescribed without any side effects. He has not been using her insulin for 4 weeks and says her sugars have been fine without it.  Was using her SSI  With Humalog. She was on Levemir 41 units. Started waking up feeling bad.    Says never got Neurology referral. We placed referral  In September.  WE had referred her to [facility].  She is concerned because she still having memory problems from her stroke.  Tob abuse - she is in the point where she knows she needs to quit smoking. She says she is working hard to cut back to one pack a day and then plans on switching over to the patch. She has a box at home that she has purchased.  She did burn her fingertips on both hands. She was cleaning with bleach and actually caused a chemical burn. She had blisters initially. There is are starting to heal. She's been using Vaseline and Neosporin ointment on it.   Review of Systems     Objective:   Physical Exam  Constitutional: She is oriented to person, place, and time. She appears well-developed and well-nourished.  HENT:  Head: Normocephalic and atraumatic.  Cardiovascular: Normal rate, regular rhythm and normal heart sounds.   Pulmonary/Chest: Effort normal and breath sounds normal.  Neurological: She is alert and oriented to person, place, and time.  Skin: Skin is warm and dry.  Psychiatric: She has a normal mood and affect. Her behavior is normal.    He does have some burn scabs on her fingertips on both hands. Overall they look like they're healing well. No sign of acute infection.      Assessment & Plan:  DM- Uncontrolled.  A1C is 7.3.  Restart Levemir at 15 units. Can increase by 1 unit a day until fasting blood sugars are  under 130.   Hx of stroke, memory problems - we will call [facility] neurology to see if they have attempted to contact her. Referral was sent in September.  Discussed colonoscopy - she declined screening colonoscopy. We discussed Colocort as an option. She felt comfortable with that and so she is willing to do that.  Second degree burn on both fingertips-at this point recommend using Vaseline to moisturize and for healing purposes. This help reduce scar formation as well.  She would like a muscle relaxer to use for her back as needed and for cramping in her lower extremities. She was briefly given a prescription for Soma but I discussed that that would really need to come from her pain management provider is someone can be very habit forming. She says she is willing to try Flexeril. New prescription sent to the pharmacy.  Decline flu vaccine today.   Declined mammogram today.

## 2015-07-17 ENCOUNTER — Other Ambulatory Visit: Payer: Self-pay | Admitting: Family Medicine

## 2015-07-17 MED ORDER — QUETIAPINE FUMARATE ER 300 MG PO TB24: ORAL_TABLET | ORAL | Status: DC

## 2015-07-21 ENCOUNTER — Other Ambulatory Visit: Payer: Self-pay | Admitting: Family Medicine

## 2015-07-21 DIAGNOSIS — Z1231 Encounter for screening mammogram for malignant neoplasm of breast: Secondary | ICD-10-CM

## 2015-07-31 ENCOUNTER — Telehealth: Payer: Self-pay | Admitting: Family Medicine

## 2015-07-31 MED ORDER — QUETIAPINE FUMARATE 300 MG PO TABS: 300.0000 mg | ORAL_TABLET | Freq: Two times a day (BID) | ORAL | Status: DC

## 2015-07-31 NOTE — Telephone Encounter (Signed)
Received letter from [employer] advising they will no longer cover Seroquel XR. Per PCP, we will send over regular Seroquel and see if that is covered. Called and spoke with Pt, she advised that was OK. Pt was informed if this does not work we will have the change the Rx completely, verbalized understanding.

## 2015-07-31 NOTE — Telephone Encounter (Signed)
New Rx sent.

## 2015-08-05 ENCOUNTER — Ambulatory Visit: Payer: Medicare Other

## 2015-08-14 ENCOUNTER — Telehealth: Payer: Self-pay

## 2015-08-14 NOTE — Telephone Encounter (Signed)
Amber Faulkner called and states the Flexeril in not working at all. She would like something different to try. Please advise. She will wait for Dr Madilyn Fireman to return on Monday.

## 2015-08-16 MED ORDER — TIZANIDINE HCL 4 MG PO CAPS: 4.0000 mg | ORAL_CAPSULE | Freq: Every day | ORAL | Status: DC | PRN

## 2015-08-16 NOTE — Telephone Encounter (Signed)
We can try zanaflex. New Rx sent.

## 2015-08-17 NOTE — Telephone Encounter (Signed)
Left message advising of recommendations.  

## 2015-08-19 DIAGNOSIS — I10 Essential (primary) hypertension: Secondary | ICD-10-CM | POA: Diagnosis not present

## 2015-08-19 DIAGNOSIS — R05 Cough: Secondary | ICD-10-CM | POA: Diagnosis not present

## 2015-08-19 DIAGNOSIS — Z79899 Other long term (current) drug therapy: Secondary | ICD-10-CM | POA: Diagnosis not present

## 2015-08-19 DIAGNOSIS — F17210 Nicotine dependence, cigarettes, uncomplicated: Secondary | ICD-10-CM | POA: Diagnosis not present

## 2015-08-19 DIAGNOSIS — N179 Acute kidney failure, unspecified: Secondary | ICD-10-CM | POA: Diagnosis not present

## 2015-08-19 DIAGNOSIS — E119 Type 2 diabetes mellitus without complications: Secondary | ICD-10-CM | POA: Diagnosis not present

## 2015-08-19 DIAGNOSIS — I639 Cerebral infarction, unspecified: Secondary | ICD-10-CM | POA: Diagnosis not present

## 2015-08-19 DIAGNOSIS — G35 Multiple sclerosis: Secondary | ICD-10-CM | POA: Diagnosis not present

## 2015-08-19 DIAGNOSIS — E1142 Type 2 diabetes mellitus with diabetic polyneuropathy: Secondary | ICD-10-CM | POA: Diagnosis not present

## 2015-08-19 DIAGNOSIS — R51 Headache: Secondary | ICD-10-CM | POA: Diagnosis not present

## 2015-08-19 DIAGNOSIS — Z7951 Long term (current) use of inhaled steroids: Secondary | ICD-10-CM | POA: Diagnosis not present

## 2015-08-19 DIAGNOSIS — R531 Weakness: Secondary | ICD-10-CM | POA: Diagnosis not present

## 2015-08-19 DIAGNOSIS — Z87892 Personal history of anaphylaxis: Secondary | ICD-10-CM | POA: Diagnosis not present

## 2015-08-19 DIAGNOSIS — Z881 Allergy status to other antibiotic agents status: Secondary | ICD-10-CM | POA: Diagnosis not present

## 2015-08-19 DIAGNOSIS — R5383 Other fatigue: Secondary | ICD-10-CM | POA: Diagnosis not present

## 2015-08-19 DIAGNOSIS — Z7982 Long term (current) use of aspirin: Secondary | ICD-10-CM | POA: Diagnosis not present

## 2015-08-19 DIAGNOSIS — Z794 Long term (current) use of insulin: Secondary | ICD-10-CM | POA: Diagnosis not present

## 2015-08-19 DIAGNOSIS — R404 Transient alteration of awareness: Secondary | ICD-10-CM | POA: Diagnosis not present

## 2015-08-19 DIAGNOSIS — Z880 Allergy status to penicillin: Secondary | ICD-10-CM | POA: Diagnosis not present

## 2015-09-04 ENCOUNTER — Other Ambulatory Visit: Payer: Self-pay

## 2015-09-04 NOTE — Telephone Encounter (Signed)
Amber Faulkner states she needs a medication to replace the Lyrica. Please advise. She also wants a different medication for sleep. I will let her know when I call her back that she needs an appointment to discuss sleep medication.

## 2015-09-04 NOTE — Telephone Encounter (Signed)
See if she would prefer to go to gabapentin instead of the Lyrica? And definitely needs an appointment to discuss sleep medication.

## 2015-09-07 DIAGNOSIS — M5030 Other cervical disc degeneration, unspecified cervical region: Secondary | ICD-10-CM | POA: Diagnosis not present

## 2015-09-07 DIAGNOSIS — M4726 Other spondylosis with radiculopathy, lumbar region: Secondary | ICD-10-CM | POA: Diagnosis not present

## 2015-09-07 DIAGNOSIS — M4722 Other spondylosis with radiculopathy, cervical region: Secondary | ICD-10-CM | POA: Diagnosis not present

## 2015-09-07 DIAGNOSIS — Z79891 Long term (current) use of opiate analgesic: Secondary | ICD-10-CM | POA: Diagnosis not present

## 2015-09-07 DIAGNOSIS — M5136 Other intervertebral disc degeneration, lumbar region: Secondary | ICD-10-CM | POA: Diagnosis not present

## 2015-09-07 NOTE — Telephone Encounter (Signed)
Patient would like to start gabapentin. Please advise.

## 2015-09-07 NOTE — Telephone Encounter (Signed)
OK. Ok to print rx and I will sign.  i will send over new script but we will have to titrate her dose.

## 2015-09-08 MED ORDER — GABAPENTIN 100 MG PO CAPS: 100.0000 mg | ORAL_CAPSULE | Freq: Three times a day (TID) | ORAL | Status: DC

## 2015-09-08 NOTE — Telephone Encounter (Signed)
Left message advising of recommendations.  

## 2015-09-11 ENCOUNTER — Ambulatory Visit: Payer: Medicare Other | Admitting: Family Medicine

## 2015-09-25 ENCOUNTER — Telehealth: Payer: Self-pay | Admitting: Family Medicine

## 2015-09-25 NOTE — Telephone Encounter (Signed)
Left VM for Pt to see if she was going to complete her Cologuard. Callback information provided to return call.

## 2015-09-30 ENCOUNTER — Telehealth: Payer: Self-pay

## 2015-09-30 ENCOUNTER — Other Ambulatory Visit: Payer: Self-pay | Admitting: Family Medicine

## 2015-09-30 NOTE — Telephone Encounter (Signed)
Left message advising of recommendations.  

## 2015-09-30 NOTE — Telephone Encounter (Signed)
She may need to call her [place] doc for this

## 2015-09-30 NOTE — Telephone Encounter (Signed)
Amber Faulkner called and would like a letter stating she cannot do any heavy lifting. She has a home inspection.

## 2015-10-01 DIAGNOSIS — R413 Other amnesia: Secondary | ICD-10-CM | POA: Diagnosis not present

## 2015-10-01 DIAGNOSIS — R404 Transient alteration of awareness: Secondary | ICD-10-CM | POA: Diagnosis not present

## 2015-10-01 DIAGNOSIS — I639 Cerebral infarction, unspecified: Secondary | ICD-10-CM | POA: Diagnosis not present

## 2015-10-01 DIAGNOSIS — R200 Anesthesia of skin: Secondary | ICD-10-CM | POA: Diagnosis not present

## 2015-10-13 ENCOUNTER — Telehealth: Payer: Self-pay | Admitting: *Deleted

## 2015-10-13 DIAGNOSIS — E119 Type 2 diabetes mellitus without complications: Secondary | ICD-10-CM | POA: Diagnosis not present

## 2015-10-13 LAB — HM DIABETES EYE EXAM

## 2015-10-13 NOTE — Telephone Encounter (Signed)
Faxing form to initiate PA for gen seroquel

## 2015-10-15 ENCOUNTER — Ambulatory Visit: Payer: Medicare Other | Admitting: Family Medicine

## 2015-10-15 DIAGNOSIS — R413 Other amnesia: Secondary | ICD-10-CM | POA: Diagnosis not present

## 2015-10-15 DIAGNOSIS — M5417 Radiculopathy, lumbosacral region: Secondary | ICD-10-CM | POA: Diagnosis not present

## 2015-10-15 DIAGNOSIS — M545 Low back pain: Secondary | ICD-10-CM | POA: Diagnosis not present

## 2015-10-15 DIAGNOSIS — E1142 Type 2 diabetes mellitus with diabetic polyneuropathy: Secondary | ICD-10-CM | POA: Diagnosis not present

## 2015-10-15 DIAGNOSIS — R200 Anesthesia of skin: Secondary | ICD-10-CM | POA: Diagnosis not present

## 2015-10-22 ENCOUNTER — Encounter: Payer: Self-pay | Admitting: Family Medicine

## 2015-10-26 DIAGNOSIS — R202 Paresthesia of skin: Secondary | ICD-10-CM | POA: Diagnosis not present

## 2015-10-26 DIAGNOSIS — M5417 Radiculopathy, lumbosacral region: Secondary | ICD-10-CM | POA: Diagnosis not present

## 2015-10-26 DIAGNOSIS — E1142 Type 2 diabetes mellitus with diabetic polyneuropathy: Secondary | ICD-10-CM | POA: Diagnosis not present

## 2015-10-26 DIAGNOSIS — R404 Transient alteration of awareness: Secondary | ICD-10-CM | POA: Diagnosis not present

## 2015-10-27 ENCOUNTER — Ambulatory Visit (INDEPENDENT_AMBULATORY_CARE_PROVIDER_SITE_OTHER): Payer: Medicare Other | Admitting: Family Medicine

## 2015-10-27 ENCOUNTER — Encounter: Payer: Self-pay | Admitting: Family Medicine

## 2015-10-27 VITALS — BP 110/69 | HR 88 | Wt 203.0 lb

## 2015-10-27 DIAGNOSIS — Z1231 Encounter for screening mammogram for malignant neoplasm of breast: Secondary | ICD-10-CM

## 2015-10-27 DIAGNOSIS — E1142 Type 2 diabetes mellitus with diabetic polyneuropathy: Secondary | ICD-10-CM | POA: Diagnosis not present

## 2015-10-27 DIAGNOSIS — E1165 Type 2 diabetes mellitus with hyperglycemia: Secondary | ICD-10-CM

## 2015-10-27 DIAGNOSIS — F329 Major depressive disorder, single episode, unspecified: Secondary | ICD-10-CM

## 2015-10-27 DIAGNOSIS — Z794 Long term (current) use of insulin: Secondary | ICD-10-CM

## 2015-10-27 DIAGNOSIS — IMO0002 Reserved for concepts with insufficient information to code with codable children: Secondary | ICD-10-CM | POA: Insufficient documentation

## 2015-10-27 DIAGNOSIS — Z8673 Personal history of transient ischemic attack (TIA), and cerebral infarction without residual deficits: Secondary | ICD-10-CM

## 2015-10-27 DIAGNOSIS — F17200 Nicotine dependence, unspecified, uncomplicated: Secondary | ICD-10-CM

## 2015-10-27 DIAGNOSIS — G9520 Unspecified cord compression: Secondary | ICD-10-CM | POA: Diagnosis not present

## 2015-10-27 DIAGNOSIS — M797 Fibromyalgia: Secondary | ICD-10-CM

## 2015-10-27 DIAGNOSIS — G629 Polyneuropathy, unspecified: Secondary | ICD-10-CM

## 2015-10-27 DIAGNOSIS — F32A Depression, unspecified: Secondary | ICD-10-CM

## 2015-10-27 LAB — POCT GLYCOSYLATED HEMOGLOBIN (HGB A1C): Hemoglobin A1C: 8.8

## 2015-10-27 MED ORDER — GABAPENTIN 300 MG PO CAPS: 300.0000 mg | ORAL_CAPSULE | Freq: Four times a day (QID) | ORAL | Status: DC

## 2015-10-27 MED ORDER — TIZANIDINE HCL 4 MG PO TABS: ORAL_TABLET | ORAL | Status: DC

## 2015-10-27 MED ORDER — QUETIAPINE FUMARATE 100 MG PO TABS: ORAL_TABLET | ORAL | Status: DC

## 2015-10-27 NOTE — Patient Instructions (Signed)
Increase Lantus to 20 units. After one week increase the Lantus by 1 unit until morning blood sugars are under 120. And stay at that dose until I see you back. Encourage you to quit smoking or at least cut back. We will try to place a referral for Meals on Wheels. Should have your letter ready by Thursday morning. We are going to increase her gabapentin to a total of 1200 mg per day.

## 2015-10-27 NOTE — Progress Notes (Signed)
Subjective:    Patient ID: Amber Faulkner, female    DOB: 11-22-1960, 55 y.o.   MRN: [id number]  HPI Diabetes - no hypoglycemic events. No wounds or sores that are not healing well. No increased thirst or urination. Checking glucose at home. She decreased her Levemir to 10 units on her own. She was supposed to be taking 40 units. She said she decrease it because her sugars were fine.  She also unfortunately had a recent repeat stroke. She is following with Dr. Lawrence Marseilles, neurology. She said after the second stroke she is having difficulty using her left arm and had a lot of numbness and over most of her body. She is unable to bathe herself dress herself or [place] or clean. She was already unable to drive previously. She is requesting that I write a letter today asking if she might qualify for a personal care assistant or a 24 hour nurse to live with her to help her with her ADLs otherwise she will have to move from her current apartment to a nursing home. She reports that she is extremely weak in her knees and ankles have been giving out on her.  poly neuropathy-she is currently up to 800 mg of gabapentin. Again she is not taking it as prescribed. She is supposed to be taking 2 mammograms in the morning 100 mg at lunch and 200 mg in the evening. She wants to know if the dose can be increased as she feels like it's not effective in controlling her pain. She Artie sees pain management, Dr. Selinda Orion.  Severe depression-she stopped her Seroquel about 2 months ago because she says it was not covered by her insurance. She did not notify us to let us know that there was a problem. Since then she has not been sleeping well has been extremely tearful and says that she doesn't want to live like this anymore. Next  Tobacco abuse-she is still smoking daily. Again reminded her this increases her risk for repeat stroke.  Encouraged to quit.   Fibromyalgia-she wants to know if we can increase her Zanaflex. She says  she still getting a lot of spasms and tension especially in her forearms.   Review of Systems     Objective:   Physical Exam  Constitutional: She is oriented to person, place, and time. She appears well-developed and well-nourished.  HENT:  Head: Normocephalic and atraumatic.  Right Ear: External ear normal.  Left Ear: External ear normal.  Nose: Nose normal.  Eyes: Conjunctivae are normal.  Cardiovascular: Normal rate, regular rhythm and normal heart sounds.   Pulmonary/Chest: Effort normal and breath sounds normal.  Musculoskeletal: She exhibits no edema.  Neurological: She is alert and oriented to person, place, and time.  Skin: Skin is warm and dry.  Psychiatric: Her behavior is normal.  tearful          Assessment & Plan:  DM- Uncontrolled. Heme 11 A1c is elevated today. Up from previous. Increase Levemir to 20 units. After one week increase by 1 unit per day until fasting blood sugars in the morning are under 120. She denies any recent dietary changes etc. but she has been more sedentary since her second stroke. And she self adjusted her insulin back down. Next  Stroke-following with Dr. Lawrence Marseilles. I'll be happy to provide a letter for her to see if she might qualify for some additional assistance. Strongly recommended physical therapy. At first she said physical therapy makes it worse but  I explained that after recovery from a stroke it's extremely important to do physical therapy to reconnect the neurons to try to improve weakness and mobility on the extremes they have been affected. We'll place referral today. They may even be able to help with some nursing care at least for short period of time. I also encouraged her to check into Meals on Wheels. She says it does require referral.  Neuropathy-try increasing her gabapentin to 1200 mg total per day.  Fibromyalgia-her insurance will only pay for the 2 and 4 mg Zanaflex we will continue with current regimen.  Depression,  Severe - I suspect that her insurance will not cover the extended release Seroquel so we will restart the Seroquel with the regular release version. New prescription sent to the pharmacy. Hopefully they will cover the drug and she can pick it up this evening and get started on it. Explained that it actually starts working extremely quickly.

## 2015-10-28 ENCOUNTER — Encounter: Payer: Self-pay | Admitting: Family Medicine

## 2015-10-28 NOTE — Telephone Encounter (Signed)
Called and checked on the status of quetiapine 300mg  and this has been approved through insurance. Test claim accepted for $3 copay. Called and left a message on patient's vm.(PA # [id number])

## 2015-10-30 DIAGNOSIS — G9529 Other cord compression: Secondary | ICD-10-CM | POA: Diagnosis not present

## 2015-10-30 DIAGNOSIS — G4700 Insomnia, unspecified: Secondary | ICD-10-CM | POA: Diagnosis not present

## 2015-10-30 DIAGNOSIS — E1165 Type 2 diabetes mellitus with hyperglycemia: Secondary | ICD-10-CM | POA: Diagnosis not present

## 2015-10-30 DIAGNOSIS — F329 Major depressive disorder, single episode, unspecified: Secondary | ICD-10-CM | POA: Diagnosis not present

## 2015-10-30 DIAGNOSIS — E1142 Type 2 diabetes mellitus with diabetic polyneuropathy: Secondary | ICD-10-CM | POA: Diagnosis not present

## 2015-10-30 DIAGNOSIS — Z794 Long term (current) use of insulin: Secondary | ICD-10-CM | POA: Diagnosis not present

## 2015-10-30 DIAGNOSIS — Z9181 History of falling: Secondary | ICD-10-CM | POA: Diagnosis not present

## 2015-10-30 DIAGNOSIS — F17200 Nicotine dependence, unspecified, uncomplicated: Secondary | ICD-10-CM | POA: Diagnosis not present

## 2015-10-30 DIAGNOSIS — Z7982 Long term (current) use of aspirin: Secondary | ICD-10-CM | POA: Diagnosis not present

## 2015-10-30 DIAGNOSIS — I69334 Monoplegia of upper limb following cerebral infarction affecting left non-dominant side: Secondary | ICD-10-CM | POA: Diagnosis not present

## 2015-10-30 DIAGNOSIS — M797 Fibromyalgia: Secondary | ICD-10-CM | POA: Diagnosis not present

## 2015-10-30 DIAGNOSIS — F4310 Post-traumatic stress disorder, unspecified: Secondary | ICD-10-CM | POA: Diagnosis not present

## 2015-11-02 DIAGNOSIS — G35 Multiple sclerosis: Secondary | ICD-10-CM | POA: Diagnosis not present

## 2015-11-02 DIAGNOSIS — E1142 Type 2 diabetes mellitus with diabetic polyneuropathy: Secondary | ICD-10-CM | POA: Diagnosis not present

## 2015-11-02 DIAGNOSIS — M5417 Radiculopathy, lumbosacral region: Secondary | ICD-10-CM | POA: Diagnosis not present

## 2015-11-02 DIAGNOSIS — R2681 Unsteadiness on feet: Secondary | ICD-10-CM | POA: Diagnosis not present

## 2015-11-02 DIAGNOSIS — R42 Dizziness and giddiness: Secondary | ICD-10-CM | POA: Diagnosis not present

## 2015-11-03 DIAGNOSIS — M797 Fibromyalgia: Secondary | ICD-10-CM | POA: Diagnosis not present

## 2015-11-03 DIAGNOSIS — F329 Major depressive disorder, single episode, unspecified: Secondary | ICD-10-CM | POA: Diagnosis not present

## 2015-11-03 DIAGNOSIS — E1165 Type 2 diabetes mellitus with hyperglycemia: Secondary | ICD-10-CM | POA: Diagnosis not present

## 2015-11-03 DIAGNOSIS — G9529 Other cord compression: Secondary | ICD-10-CM | POA: Diagnosis not present

## 2015-11-03 DIAGNOSIS — E1142 Type 2 diabetes mellitus with diabetic polyneuropathy: Secondary | ICD-10-CM | POA: Diagnosis not present

## 2015-11-03 DIAGNOSIS — I69334 Monoplegia of upper limb following cerebral infarction affecting left non-dominant side: Secondary | ICD-10-CM | POA: Diagnosis not present

## 2015-11-05 DIAGNOSIS — I69334 Monoplegia of upper limb following cerebral infarction affecting left non-dominant side: Secondary | ICD-10-CM | POA: Diagnosis not present

## 2015-11-05 DIAGNOSIS — E1165 Type 2 diabetes mellitus with hyperglycemia: Secondary | ICD-10-CM | POA: Diagnosis not present

## 2015-11-05 DIAGNOSIS — F329 Major depressive disorder, single episode, unspecified: Secondary | ICD-10-CM | POA: Diagnosis not present

## 2015-11-05 DIAGNOSIS — G9529 Other cord compression: Secondary | ICD-10-CM | POA: Diagnosis not present

## 2015-11-05 DIAGNOSIS — E1142 Type 2 diabetes mellitus with diabetic polyneuropathy: Secondary | ICD-10-CM | POA: Diagnosis not present

## 2015-11-05 DIAGNOSIS — M797 Fibromyalgia: Secondary | ICD-10-CM | POA: Diagnosis not present

## 2015-11-07 ENCOUNTER — Other Ambulatory Visit: Payer: Self-pay | Admitting: Family Medicine

## 2015-11-09 ENCOUNTER — Encounter: Payer: Self-pay | Admitting: Family Medicine

## 2015-11-09 DIAGNOSIS — M4726 Other spondylosis with radiculopathy, lumbar region: Secondary | ICD-10-CM | POA: Diagnosis not present

## 2015-11-09 DIAGNOSIS — M4722 Other spondylosis with radiculopathy, cervical region: Secondary | ICD-10-CM | POA: Diagnosis not present

## 2015-11-09 DIAGNOSIS — M5030 Other cervical disc degeneration, unspecified cervical region: Secondary | ICD-10-CM | POA: Diagnosis not present

## 2015-11-09 DIAGNOSIS — Z79891 Long term (current) use of opiate analgesic: Secondary | ICD-10-CM | POA: Diagnosis not present

## 2015-11-09 DIAGNOSIS — M5136 Other intervertebral disc degeneration, lumbar region: Secondary | ICD-10-CM | POA: Diagnosis not present

## 2015-11-10 DIAGNOSIS — E1165 Type 2 diabetes mellitus with hyperglycemia: Secondary | ICD-10-CM | POA: Diagnosis not present

## 2015-11-10 DIAGNOSIS — M797 Fibromyalgia: Secondary | ICD-10-CM | POA: Diagnosis not present

## 2015-11-10 DIAGNOSIS — E1142 Type 2 diabetes mellitus with diabetic polyneuropathy: Secondary | ICD-10-CM | POA: Diagnosis not present

## 2015-11-10 DIAGNOSIS — I69334 Monoplegia of upper limb following cerebral infarction affecting left non-dominant side: Secondary | ICD-10-CM | POA: Diagnosis not present

## 2015-11-10 DIAGNOSIS — F329 Major depressive disorder, single episode, unspecified: Secondary | ICD-10-CM | POA: Diagnosis not present

## 2015-11-10 DIAGNOSIS — G9529 Other cord compression: Secondary | ICD-10-CM | POA: Diagnosis not present

## 2015-11-11 DIAGNOSIS — M797 Fibromyalgia: Secondary | ICD-10-CM | POA: Diagnosis not present

## 2015-11-11 DIAGNOSIS — E1142 Type 2 diabetes mellitus with diabetic polyneuropathy: Secondary | ICD-10-CM | POA: Diagnosis not present

## 2015-11-11 DIAGNOSIS — E1165 Type 2 diabetes mellitus with hyperglycemia: Secondary | ICD-10-CM | POA: Diagnosis not present

## 2015-11-11 DIAGNOSIS — I69334 Monoplegia of upper limb following cerebral infarction affecting left non-dominant side: Secondary | ICD-10-CM | POA: Diagnosis not present

## 2015-11-11 DIAGNOSIS — G9529 Other cord compression: Secondary | ICD-10-CM | POA: Diagnosis not present

## 2015-11-11 DIAGNOSIS — F329 Major depressive disorder, single episode, unspecified: Secondary | ICD-10-CM | POA: Diagnosis not present

## 2015-11-13 DIAGNOSIS — G9529 Other cord compression: Secondary | ICD-10-CM | POA: Diagnosis not present

## 2015-11-13 DIAGNOSIS — I69334 Monoplegia of upper limb following cerebral infarction affecting left non-dominant side: Secondary | ICD-10-CM | POA: Diagnosis not present

## 2015-11-13 DIAGNOSIS — E1142 Type 2 diabetes mellitus with diabetic polyneuropathy: Secondary | ICD-10-CM | POA: Diagnosis not present

## 2015-11-13 DIAGNOSIS — E1165 Type 2 diabetes mellitus with hyperglycemia: Secondary | ICD-10-CM | POA: Diagnosis not present

## 2015-11-16 DIAGNOSIS — F329 Major depressive disorder, single episode, unspecified: Secondary | ICD-10-CM | POA: Diagnosis not present

## 2015-11-16 DIAGNOSIS — E1165 Type 2 diabetes mellitus with hyperglycemia: Secondary | ICD-10-CM | POA: Diagnosis not present

## 2015-11-16 DIAGNOSIS — E1142 Type 2 diabetes mellitus with diabetic polyneuropathy: Secondary | ICD-10-CM | POA: Diagnosis not present

## 2015-11-16 DIAGNOSIS — I69334 Monoplegia of upper limb following cerebral infarction affecting left non-dominant side: Secondary | ICD-10-CM | POA: Diagnosis not present

## 2015-11-16 DIAGNOSIS — M797 Fibromyalgia: Secondary | ICD-10-CM | POA: Diagnosis not present

## 2015-11-16 DIAGNOSIS — G9529 Other cord compression: Secondary | ICD-10-CM | POA: Diagnosis not present

## 2015-11-17 DIAGNOSIS — I69334 Monoplegia of upper limb following cerebral infarction affecting left non-dominant side: Secondary | ICD-10-CM | POA: Diagnosis not present

## 2015-11-17 DIAGNOSIS — G9529 Other cord compression: Secondary | ICD-10-CM | POA: Diagnosis not present

## 2015-11-17 DIAGNOSIS — M797 Fibromyalgia: Secondary | ICD-10-CM | POA: Diagnosis not present

## 2015-11-17 DIAGNOSIS — F329 Major depressive disorder, single episode, unspecified: Secondary | ICD-10-CM | POA: Diagnosis not present

## 2015-11-17 DIAGNOSIS — E1142 Type 2 diabetes mellitus with diabetic polyneuropathy: Secondary | ICD-10-CM | POA: Diagnosis not present

## 2015-11-17 DIAGNOSIS — E1165 Type 2 diabetes mellitus with hyperglycemia: Secondary | ICD-10-CM | POA: Diagnosis not present

## 2015-11-18 DIAGNOSIS — G9529 Other cord compression: Secondary | ICD-10-CM | POA: Diagnosis not present

## 2015-11-18 DIAGNOSIS — E1142 Type 2 diabetes mellitus with diabetic polyneuropathy: Secondary | ICD-10-CM | POA: Diagnosis not present

## 2015-11-18 DIAGNOSIS — F329 Major depressive disorder, single episode, unspecified: Secondary | ICD-10-CM | POA: Diagnosis not present

## 2015-11-18 DIAGNOSIS — E1165 Type 2 diabetes mellitus with hyperglycemia: Secondary | ICD-10-CM | POA: Diagnosis not present

## 2015-11-18 DIAGNOSIS — I69334 Monoplegia of upper limb following cerebral infarction affecting left non-dominant side: Secondary | ICD-10-CM | POA: Diagnosis not present

## 2015-11-18 DIAGNOSIS — M797 Fibromyalgia: Secondary | ICD-10-CM | POA: Diagnosis not present

## 2015-11-23 ENCOUNTER — Telehealth: Payer: Self-pay | Admitting: Family Medicine

## 2015-11-23 NOTE — Telephone Encounter (Signed)
Received call from [facility] with [place]. He is discharging Pt from home health nursing due to noncompliance. He states Pt will not answer the phone and when they come to appointments she will not answer the door or refuse to let nursing staff inside home to assist her. He does report Pt got her glucometer and has been using it. No further questions/concerns.

## 2015-11-26 DIAGNOSIS — G9529 Other cord compression: Secondary | ICD-10-CM | POA: Diagnosis not present

## 2015-11-26 DIAGNOSIS — E1165 Type 2 diabetes mellitus with hyperglycemia: Secondary | ICD-10-CM | POA: Diagnosis not present

## 2015-11-26 DIAGNOSIS — I69334 Monoplegia of upper limb following cerebral infarction affecting left non-dominant side: Secondary | ICD-10-CM | POA: Diagnosis not present

## 2015-11-26 DIAGNOSIS — F329 Major depressive disorder, single episode, unspecified: Secondary | ICD-10-CM | POA: Diagnosis not present

## 2015-11-26 DIAGNOSIS — E1142 Type 2 diabetes mellitus with diabetic polyneuropathy: Secondary | ICD-10-CM | POA: Diagnosis not present

## 2015-11-26 DIAGNOSIS — M797 Fibromyalgia: Secondary | ICD-10-CM | POA: Diagnosis not present

## 2015-11-27 ENCOUNTER — Other Ambulatory Visit: Payer: Self-pay | Admitting: Family Medicine

## 2015-11-27 DIAGNOSIS — E1142 Type 2 diabetes mellitus with diabetic polyneuropathy: Secondary | ICD-10-CM | POA: Diagnosis not present

## 2015-11-27 DIAGNOSIS — E1165 Type 2 diabetes mellitus with hyperglycemia: Secondary | ICD-10-CM | POA: Diagnosis not present

## 2015-11-27 DIAGNOSIS — G9529 Other cord compression: Secondary | ICD-10-CM | POA: Diagnosis not present

## 2015-11-27 DIAGNOSIS — F329 Major depressive disorder, single episode, unspecified: Secondary | ICD-10-CM | POA: Diagnosis not present

## 2015-11-27 DIAGNOSIS — I69334 Monoplegia of upper limb following cerebral infarction affecting left non-dominant side: Secondary | ICD-10-CM | POA: Diagnosis not present

## 2015-11-27 DIAGNOSIS — M797 Fibromyalgia: Secondary | ICD-10-CM | POA: Diagnosis not present

## 2015-12-02 ENCOUNTER — Other Ambulatory Visit: Payer: Self-pay | Admitting: Family Medicine

## 2015-12-03 ENCOUNTER — Other Ambulatory Visit: Payer: Self-pay | Admitting: Family Medicine

## 2015-12-03 DIAGNOSIS — E1165 Type 2 diabetes mellitus with hyperglycemia: Secondary | ICD-10-CM | POA: Diagnosis not present

## 2015-12-03 DIAGNOSIS — E1142 Type 2 diabetes mellitus with diabetic polyneuropathy: Secondary | ICD-10-CM | POA: Diagnosis not present

## 2015-12-03 DIAGNOSIS — M797 Fibromyalgia: Secondary | ICD-10-CM | POA: Diagnosis not present

## 2015-12-03 DIAGNOSIS — I69334 Monoplegia of upper limb following cerebral infarction affecting left non-dominant side: Secondary | ICD-10-CM | POA: Diagnosis not present

## 2015-12-03 DIAGNOSIS — G9529 Other cord compression: Secondary | ICD-10-CM | POA: Diagnosis not present

## 2015-12-03 DIAGNOSIS — F329 Major depressive disorder, single episode, unspecified: Secondary | ICD-10-CM | POA: Diagnosis not present

## 2015-12-17 DIAGNOSIS — F329 Major depressive disorder, single episode, unspecified: Secondary | ICD-10-CM | POA: Diagnosis not present

## 2015-12-17 DIAGNOSIS — I69334 Monoplegia of upper limb following cerebral infarction affecting left non-dominant side: Secondary | ICD-10-CM | POA: Diagnosis not present

## 2015-12-17 DIAGNOSIS — M797 Fibromyalgia: Secondary | ICD-10-CM | POA: Diagnosis not present

## 2015-12-17 DIAGNOSIS — E1142 Type 2 diabetes mellitus with diabetic polyneuropathy: Secondary | ICD-10-CM | POA: Diagnosis not present

## 2015-12-17 DIAGNOSIS — E1165 Type 2 diabetes mellitus with hyperglycemia: Secondary | ICD-10-CM | POA: Diagnosis not present

## 2015-12-17 DIAGNOSIS — G9529 Other cord compression: Secondary | ICD-10-CM | POA: Diagnosis not present

## 2015-12-22 DIAGNOSIS — E1165 Type 2 diabetes mellitus with hyperglycemia: Secondary | ICD-10-CM | POA: Diagnosis not present

## 2015-12-22 DIAGNOSIS — G9529 Other cord compression: Secondary | ICD-10-CM | POA: Diagnosis not present

## 2015-12-22 DIAGNOSIS — I69334 Monoplegia of upper limb following cerebral infarction affecting left non-dominant side: Secondary | ICD-10-CM | POA: Diagnosis not present

## 2015-12-22 DIAGNOSIS — E1142 Type 2 diabetes mellitus with diabetic polyneuropathy: Secondary | ICD-10-CM | POA: Diagnosis not present

## 2015-12-22 DIAGNOSIS — M797 Fibromyalgia: Secondary | ICD-10-CM | POA: Diagnosis not present

## 2015-12-22 DIAGNOSIS — F329 Major depressive disorder, single episode, unspecified: Secondary | ICD-10-CM | POA: Diagnosis not present

## 2015-12-24 DIAGNOSIS — E1142 Type 2 diabetes mellitus with diabetic polyneuropathy: Secondary | ICD-10-CM | POA: Diagnosis not present

## 2015-12-24 DIAGNOSIS — I69334 Monoplegia of upper limb following cerebral infarction affecting left non-dominant side: Secondary | ICD-10-CM | POA: Diagnosis not present

## 2015-12-24 DIAGNOSIS — F329 Major depressive disorder, single episode, unspecified: Secondary | ICD-10-CM | POA: Diagnosis not present

## 2015-12-24 DIAGNOSIS — G9529 Other cord compression: Secondary | ICD-10-CM | POA: Diagnosis not present

## 2015-12-24 DIAGNOSIS — E1165 Type 2 diabetes mellitus with hyperglycemia: Secondary | ICD-10-CM | POA: Diagnosis not present

## 2015-12-24 DIAGNOSIS — M797 Fibromyalgia: Secondary | ICD-10-CM | POA: Diagnosis not present

## 2015-12-25 DIAGNOSIS — G35 Multiple sclerosis: Secondary | ICD-10-CM | POA: Diagnosis not present

## 2015-12-25 DIAGNOSIS — G40201 Localization-related (focal) (partial) symptomatic epilepsy and epileptic syndromes with complex partial seizures, not intractable, with status epilepticus: Secondary | ICD-10-CM | POA: Diagnosis not present

## 2015-12-25 DIAGNOSIS — M5417 Radiculopathy, lumbosacral region: Secondary | ICD-10-CM | POA: Diagnosis not present

## 2015-12-25 DIAGNOSIS — E1142 Type 2 diabetes mellitus with diabetic polyneuropathy: Secondary | ICD-10-CM | POA: Diagnosis not present

## 2015-12-25 DIAGNOSIS — R404 Transient alteration of awareness: Secondary | ICD-10-CM | POA: Diagnosis not present

## 2016-01-06 DIAGNOSIS — E119 Type 2 diabetes mellitus without complications: Secondary | ICD-10-CM | POA: Diagnosis not present

## 2016-01-06 DIAGNOSIS — D696 Thrombocytopenia, unspecified: Secondary | ICD-10-CM | POA: Diagnosis not present

## 2016-01-06 DIAGNOSIS — Z794 Long term (current) use of insulin: Secondary | ICD-10-CM | POA: Diagnosis not present

## 2016-01-06 DIAGNOSIS — J449 Chronic obstructive pulmonary disease, unspecified: Secondary | ICD-10-CM | POA: Diagnosis not present

## 2016-01-06 DIAGNOSIS — Z6833 Body mass index (BMI) 33.0-33.9, adult: Secondary | ICD-10-CM | POA: Diagnosis not present

## 2016-01-06 DIAGNOSIS — Z820 Family history of epilepsy and other diseases of the nervous system: Secondary | ICD-10-CM | POA: Diagnosis not present

## 2016-01-06 DIAGNOSIS — Z823 Family history of stroke: Secondary | ICD-10-CM | POA: Diagnosis not present

## 2016-01-06 DIAGNOSIS — M797 Fibromyalgia: Secondary | ICD-10-CM | POA: Diagnosis not present

## 2016-01-06 DIAGNOSIS — Z7982 Long term (current) use of aspirin: Secondary | ICD-10-CM | POA: Diagnosis not present

## 2016-01-06 DIAGNOSIS — E669 Obesity, unspecified: Secondary | ICD-10-CM | POA: Diagnosis not present

## 2016-01-06 DIAGNOSIS — G35 Multiple sclerosis: Secondary | ICD-10-CM | POA: Diagnosis not present

## 2016-01-06 DIAGNOSIS — R5382 Chronic fatigue, unspecified: Secondary | ICD-10-CM | POA: Diagnosis not present

## 2016-01-06 DIAGNOSIS — Z809 Family history of malignant neoplasm, unspecified: Secondary | ICD-10-CM | POA: Diagnosis not present

## 2016-01-06 DIAGNOSIS — F17210 Nicotine dependence, cigarettes, uncomplicated: Secondary | ICD-10-CM | POA: Diagnosis not present

## 2016-01-06 DIAGNOSIS — Z8249 Family history of ischemic heart disease and other diseases of the circulatory system: Secondary | ICD-10-CM | POA: Diagnosis not present

## 2016-01-06 DIAGNOSIS — Z90710 Acquired absence of both cervix and uterus: Secondary | ICD-10-CM | POA: Diagnosis not present

## 2016-01-06 DIAGNOSIS — Z880 Allergy status to penicillin: Secondary | ICD-10-CM | POA: Diagnosis not present

## 2016-01-06 DIAGNOSIS — Z881 Allergy status to other antibiotic agents status: Secondary | ICD-10-CM | POA: Diagnosis not present

## 2016-01-15 DIAGNOSIS — Z90710 Acquired absence of both cervix and uterus: Secondary | ICD-10-CM | POA: Diagnosis not present

## 2016-01-15 DIAGNOSIS — Z7951 Long term (current) use of inhaled steroids: Secondary | ICD-10-CM | POA: Diagnosis not present

## 2016-01-15 DIAGNOSIS — J449 Chronic obstructive pulmonary disease, unspecified: Secondary | ICD-10-CM | POA: Diagnosis not present

## 2016-01-15 DIAGNOSIS — M4802 Spinal stenosis, cervical region: Secondary | ICD-10-CM | POA: Diagnosis not present

## 2016-01-15 DIAGNOSIS — Z794 Long term (current) use of insulin: Secondary | ICD-10-CM | POA: Diagnosis not present

## 2016-01-15 DIAGNOSIS — Z881 Allergy status to other antibiotic agents status: Secondary | ICD-10-CM | POA: Diagnosis not present

## 2016-01-15 DIAGNOSIS — D696 Thrombocytopenia, unspecified: Secondary | ICD-10-CM | POA: Diagnosis not present

## 2016-01-15 DIAGNOSIS — R404 Transient alteration of awareness: Secondary | ICD-10-CM | POA: Diagnosis not present

## 2016-01-15 DIAGNOSIS — M50222 Other cervical disc displacement at C5-C6 level: Secondary | ICD-10-CM | POA: Diagnosis not present

## 2016-01-15 DIAGNOSIS — M546 Pain in thoracic spine: Secondary | ICD-10-CM | POA: Diagnosis not present

## 2016-01-15 DIAGNOSIS — M5126 Other intervertebral disc displacement, lumbar region: Secondary | ICD-10-CM | POA: Diagnosis not present

## 2016-01-15 DIAGNOSIS — M50221 Other cervical disc displacement at C4-C5 level: Secondary | ICD-10-CM | POA: Diagnosis not present

## 2016-01-15 DIAGNOSIS — E119 Type 2 diabetes mellitus without complications: Secondary | ICD-10-CM | POA: Diagnosis not present

## 2016-01-15 DIAGNOSIS — G35 Multiple sclerosis: Secondary | ICD-10-CM | POA: Diagnosis not present

## 2016-01-15 DIAGNOSIS — Z8673 Personal history of transient ischemic attack (TIA), and cerebral infarction without residual deficits: Secondary | ICD-10-CM | POA: Diagnosis not present

## 2016-01-15 DIAGNOSIS — Z720 Tobacco use: Secondary | ICD-10-CM | POA: Diagnosis not present

## 2016-01-15 DIAGNOSIS — Z880 Allergy status to penicillin: Secondary | ICD-10-CM | POA: Diagnosis not present

## 2016-01-15 DIAGNOSIS — F17210 Nicotine dependence, cigarettes, uncomplicated: Secondary | ICD-10-CM | POA: Diagnosis not present

## 2016-01-15 DIAGNOSIS — M4712 Other spondylosis with myelopathy, cervical region: Secondary | ICD-10-CM | POA: Diagnosis not present

## 2016-01-15 DIAGNOSIS — R200 Anesthesia of skin: Secondary | ICD-10-CM | POA: Diagnosis not present

## 2016-01-15 DIAGNOSIS — M47896 Other spondylosis, lumbar region: Secondary | ICD-10-CM | POA: Diagnosis not present

## 2016-01-15 DIAGNOSIS — M4806 Spinal stenosis, lumbar region: Secondary | ICD-10-CM | POA: Diagnosis not present

## 2016-01-15 DIAGNOSIS — Z7982 Long term (current) use of aspirin: Secondary | ICD-10-CM | POA: Diagnosis not present

## 2016-01-15 DIAGNOSIS — M50223 Other cervical disc displacement at C6-C7 level: Secondary | ICD-10-CM | POA: Diagnosis not present

## 2016-01-15 DIAGNOSIS — R5383 Other fatigue: Secondary | ICD-10-CM | POA: Diagnosis not present

## 2016-01-15 DIAGNOSIS — R531 Weakness: Secondary | ICD-10-CM | POA: Diagnosis not present

## 2016-01-26 DIAGNOSIS — Z79891 Long term (current) use of opiate analgesic: Secondary | ICD-10-CM | POA: Diagnosis not present

## 2016-01-26 DIAGNOSIS — M4722 Other spondylosis with radiculopathy, cervical region: Secondary | ICD-10-CM | POA: Diagnosis not present

## 2016-01-26 DIAGNOSIS — M4726 Other spondylosis with radiculopathy, lumbar region: Secondary | ICD-10-CM | POA: Diagnosis not present

## 2016-01-26 DIAGNOSIS — M5030 Other cervical disc degeneration, unspecified cervical region: Secondary | ICD-10-CM | POA: Diagnosis not present

## 2016-01-26 DIAGNOSIS — M5136 Other intervertebral disc degeneration, lumbar region: Secondary | ICD-10-CM | POA: Diagnosis not present

## 2016-01-27 DIAGNOSIS — D693 Immune thrombocytopenic purpura: Secondary | ICD-10-CM | POA: Diagnosis not present

## 2016-01-27 DIAGNOSIS — Z794 Long term (current) use of insulin: Secondary | ICD-10-CM | POA: Diagnosis not present

## 2016-01-27 DIAGNOSIS — Z880 Allergy status to penicillin: Secondary | ICD-10-CM | POA: Diagnosis not present

## 2016-01-27 DIAGNOSIS — J449 Chronic obstructive pulmonary disease, unspecified: Secondary | ICD-10-CM | POA: Diagnosis not present

## 2016-01-27 DIAGNOSIS — Z79899 Other long term (current) drug therapy: Secondary | ICD-10-CM | POA: Diagnosis not present

## 2016-01-27 DIAGNOSIS — Z888 Allergy status to other drugs, medicaments and biological substances status: Secondary | ICD-10-CM | POA: Diagnosis not present

## 2016-01-27 DIAGNOSIS — Z8619 Personal history of other infectious and parasitic diseases: Secondary | ICD-10-CM | POA: Diagnosis not present

## 2016-01-27 DIAGNOSIS — Z7951 Long term (current) use of inhaled steroids: Secondary | ICD-10-CM | POA: Diagnosis not present

## 2016-01-27 DIAGNOSIS — F17210 Nicotine dependence, cigarettes, uncomplicated: Secondary | ICD-10-CM | POA: Diagnosis not present

## 2016-01-27 DIAGNOSIS — M47819 Spondylosis without myelopathy or radiculopathy, site unspecified: Secondary | ICD-10-CM | POA: Diagnosis not present

## 2016-01-27 DIAGNOSIS — E119 Type 2 diabetes mellitus without complications: Secondary | ICD-10-CM | POA: Diagnosis not present

## 2016-01-27 DIAGNOSIS — Z881 Allergy status to other antibiotic agents status: Secondary | ICD-10-CM | POA: Diagnosis not present

## 2016-01-28 ENCOUNTER — Telehealth: Payer: Self-pay | Admitting: Family Medicine

## 2016-01-28 NOTE — Telephone Encounter (Signed)
Called and left message for pt to let her know she is due for her diabetes fu. Thanks

## 2016-02-18 DIAGNOSIS — R404 Transient alteration of awareness: Secondary | ICD-10-CM | POA: Diagnosis not present

## 2016-02-18 DIAGNOSIS — G35 Multiple sclerosis: Secondary | ICD-10-CM | POA: Diagnosis not present

## 2016-02-18 DIAGNOSIS — E1142 Type 2 diabetes mellitus with diabetic polyneuropathy: Secondary | ICD-10-CM | POA: Diagnosis not present

## 2016-02-18 DIAGNOSIS — G40201 Localization-related (focal) (partial) symptomatic epilepsy and epileptic syndromes with complex partial seizures, not intractable, with status epilepticus: Secondary | ICD-10-CM | POA: Diagnosis not present

## 2016-02-25 ENCOUNTER — Ambulatory Visit (INDEPENDENT_AMBULATORY_CARE_PROVIDER_SITE_OTHER): Payer: Medicare Other | Admitting: Physician Assistant

## 2016-02-25 ENCOUNTER — Encounter: Payer: Self-pay | Admitting: Physician Assistant

## 2016-02-25 VITALS — BP 146/95 | HR 88 | Ht 65.0 in | Wt 203.0 lb

## 2016-02-25 DIAGNOSIS — F329 Major depressive disorder, single episode, unspecified: Secondary | ICD-10-CM

## 2016-02-25 DIAGNOSIS — R112 Nausea with vomiting, unspecified: Secondary | ICD-10-CM | POA: Diagnosis not present

## 2016-02-25 DIAGNOSIS — N390 Urinary tract infection, site not specified: Secondary | ICD-10-CM

## 2016-02-25 DIAGNOSIS — E785 Hyperlipidemia, unspecified: Secondary | ICD-10-CM

## 2016-02-25 DIAGNOSIS — F4310 Post-traumatic stress disorder, unspecified: Secondary | ICD-10-CM

## 2016-02-25 DIAGNOSIS — R82998 Other abnormal findings in urine: Secondary | ICD-10-CM

## 2016-02-25 DIAGNOSIS — F411 Generalized anxiety disorder: Secondary | ICD-10-CM

## 2016-02-25 DIAGNOSIS — G4700 Insomnia, unspecified: Secondary | ICD-10-CM

## 2016-02-25 DIAGNOSIS — F32A Depression, unspecified: Secondary | ICD-10-CM

## 2016-02-25 LAB — POCT URINALYSIS DIPSTICK
BILIRUBIN UA: NEGATIVE
Blood, UA: NEGATIVE
GLUCOSE UA: NEGATIVE
KETONES UA: NEGATIVE
NITRITE UA: NEGATIVE
PH UA: 6
Protein, UA: NEGATIVE
Spec Grav, UA: >=1.03
Urobilinogen, UA: 0.2

## 2016-02-25 MED ORDER — OMEPRAZOLE 40 MG PO CPDR: 40.0000 mg | DELAYED_RELEASE_CAPSULE | Freq: Every day | ORAL | 3 refills | Status: DC

## 2016-02-25 MED ORDER — QUETIAPINE FUMARATE 100 MG PO TABS: ORAL_TABLET | ORAL | 1 refills | Status: DC

## 2016-02-25 MED ORDER — ADVAIR DISKUS 250-50 MCG/DOSE IN AEPB: INHALATION_SPRAY | RESPIRATORY_TRACT | 2 refills | Status: DC

## 2016-02-25 MED ORDER — ONDANSETRON 8 MG PO TBDP: 8.0000 mg | ORAL_TABLET | Freq: Three times a day (TID) | ORAL | 1 refills | Status: DC | PRN

## 2016-02-25 MED ORDER — ROSUVASTATIN CALCIUM 20 MG PO TABS: ORAL_TABLET | ORAL | 3 refills | Status: DC

## 2016-02-25 MED ORDER — SUCRALFATE 1 G PO TABS: 1.0000 g | ORAL_TABLET | Freq: Three times a day (TID) | ORAL | 0 refills | Status: DC

## 2016-02-25 MED ORDER — ALPRAZOLAM 0.5 MG PO TABS: 0.5000 mg | ORAL_TABLET | Freq: Two times a day (BID) | ORAL | 1 refills | Status: DC | PRN

## 2016-02-25 MED ORDER — VENLAFAXINE HCL ER 75 MG PO CP24: ORAL_CAPSULE | ORAL | 0 refills | Status: DC

## 2016-02-25 NOTE — Progress Notes (Signed)
Subjective:    Patient ID: Amber Faulkner, female    DOB: 1961-01-07, 55 y.o.   MRN: [id number]  HPI  Patient is a 55 year old female who presents to the clinic with main concern of nausea and vomiting. Symptoms started suddenly about 9 days ago. She has constant nausea with intermittent vomiting. She denies any loose stools or constipation. She denies any melena or hematochezia. She denies any abdominal pain, dysuria, fever, chills. She denies any recent changes of medication. She has not stopped or started anything recently. She does admit to taking ibuprofen 200 mg 1-8 tablets a day for frequent headaches. She does describe a burning sensation in her left upper abdomen at times.  Patient needs some refills. She has ongoing PTSD, anxiety, depression. She would like to restart her Effexor. She is concerned because she has cervical fusion surgery scheduled for early September. She will be in a cervical collar for 8 weeks. She has a lot of claustrophobia and anxiety associated with constriction of her neck. She feels like she may need something to help with this. She had a bad reaction to Klonopin in the past. Overall she is very anxious about the upcoming surgery.  Her insomnia was previously controlled with Seroquel. She stopped the medication. She would like a refill. Seroquel has been the only thing that helped her sleep.  She has not been taking Crestor for cholesterol. She would like a refill.    Review of Systems    see history of present illness. Objective:   Physical Exam  Constitutional: She is oriented to person, place, and time. She appears well-developed and well-nourished.  HENT:  Head: Normocephalic and atraumatic.  Neck: Normal range of motion. Neck supple.  Cardiovascular: Normal rate, regular rhythm and normal heart sounds.   Pulmonary/Chest: Effort normal and breath sounds normal. She has no wheezes.  Negative for any CVA tenderness bilaterally.  Abdominal:  Mild  tenderness to deep palpation of the left upper quadrant. No guarding or rebound.  Lymphadenopathy:    She has no cervical adenopathy.  Neurological: She is alert and oriented to person, place, and time.  Psychiatric: She has a normal mood and affect. Her behavior is normal.          Assessment & Plan:  Nausea and vomiting- Unclear etiology today... Results for orders placed or performed in visit on 02/25/16  POCT urinalysis dipstick  Result Value Ref Range   Color, UA dark yellow    Clarity, UA cloudy    Glucose, UA neg    Bilirubin, UA neg    Ketones, UA neg    Spec Grav, UA >=1.030    Blood, UA neg    pH, UA 6.0    Protein, UA neg    Urobilinogen, UA 0.2    Nitrite, UA neg    Leukocytes, UA small (1+) (A) Negative   Urine dipstick did have some leukocytes small. Will culture. She has no other signs of urinary tract infection. We'll continue to monitor.  She is taking a lot of anti-inflammatories which could be creating an ulcer. Stop all anti-inflammatories. Start omeprazole and Carafate. Stop Carafate after the first 2 weeks. Continue on omeprazole for the next month or so. Discussed bland diet. Zofran given for acute nausea.  Patient has been out of her medicine for months. I do not think is the drop of medication causing nausea. She has not added any new medications.  Patient certainly is stressed with upcoming surgery. We have restarted  her Effexor. Some of her nausea could be coming from her anxiety.  If nausea not improving he may need to consider blood work.  Anxiety/PTSD/depression-restarted Effexor. I did start some Xanax to use as needed up to twice a day after surgery and being placed in cervical spine collar. Discuss with patient abuse potential. Encouraged her to only take as needed.  Hyperlipidemia- refilled Crestor just restart and follow-up with PCP in the next 6 months to recheck.  Insomnia-restarted Seroquel up to 2 tablets at bedtime.

## 2016-02-27 LAB — URINE CULTURE: ORGANISM ID, BACTERIA: NO GROWTH

## 2016-03-07 DIAGNOSIS — D693 Immune thrombocytopenic purpura: Secondary | ICD-10-CM | POA: Diagnosis not present

## 2016-03-08 ENCOUNTER — Telehealth: Payer: Self-pay

## 2016-03-08 DIAGNOSIS — Z01818 Encounter for other preprocedural examination: Secondary | ICD-10-CM | POA: Diagnosis not present

## 2016-03-08 DIAGNOSIS — G35 Multiple sclerosis: Secondary | ICD-10-CM | POA: Diagnosis not present

## 2016-03-08 DIAGNOSIS — M4712 Other spondylosis with myelopathy, cervical region: Secondary | ICD-10-CM | POA: Diagnosis not present

## 2016-03-08 DIAGNOSIS — E119 Type 2 diabetes mellitus without complications: Secondary | ICD-10-CM | POA: Diagnosis not present

## 2016-03-08 DIAGNOSIS — M797 Fibromyalgia: Secondary | ICD-10-CM | POA: Diagnosis not present

## 2016-03-08 DIAGNOSIS — J449 Chronic obstructive pulmonary disease, unspecified: Secondary | ICD-10-CM | POA: Diagnosis not present

## 2016-03-08 MED ORDER — AZITHROMYCIN 250 MG PO TABS: ORAL_TABLET | ORAL | 0 refills | Status: DC

## 2016-03-08 NOTE — Telephone Encounter (Signed)
Ok to send zpak 2 tablets now and then one tablet for 4 days. #6 NRF

## 2016-03-08 NOTE — Telephone Encounter (Signed)
Patient advised.

## 2016-03-08 NOTE — Telephone Encounter (Signed)
Amber Faulkner states she is still sick. She has a productive cough with green/yellow sputum and feels nauseated. Denies fever, chills or sweats. She would like a different antibiotic. Please advise.

## 2016-03-09 ENCOUNTER — Other Ambulatory Visit: Payer: Self-pay | Admitting: Physician Assistant

## 2016-03-09 ENCOUNTER — Other Ambulatory Visit: Payer: Self-pay | Admitting: Family Medicine

## 2016-03-15 DIAGNOSIS — M5136 Other intervertebral disc degeneration, lumbar region: Secondary | ICD-10-CM | POA: Diagnosis not present

## 2016-03-15 DIAGNOSIS — M4722 Other spondylosis with radiculopathy, cervical region: Secondary | ICD-10-CM | POA: Diagnosis not present

## 2016-03-15 DIAGNOSIS — M4726 Other spondylosis with radiculopathy, lumbar region: Secondary | ICD-10-CM | POA: Diagnosis not present

## 2016-03-15 DIAGNOSIS — M5030 Other cervical disc degeneration, unspecified cervical region: Secondary | ICD-10-CM | POA: Diagnosis not present

## 2016-03-15 DIAGNOSIS — Z79891 Long term (current) use of opiate analgesic: Secondary | ICD-10-CM | POA: Diagnosis not present

## 2016-03-17 DIAGNOSIS — E119 Type 2 diabetes mellitus without complications: Secondary | ICD-10-CM | POA: Diagnosis not present

## 2016-03-17 DIAGNOSIS — F17210 Nicotine dependence, cigarettes, uncomplicated: Secondary | ICD-10-CM | POA: Diagnosis not present

## 2016-03-17 DIAGNOSIS — D696 Thrombocytopenia, unspecified: Secondary | ICD-10-CM | POA: Diagnosis not present

## 2016-03-17 DIAGNOSIS — J449 Chronic obstructive pulmonary disease, unspecified: Secondary | ICD-10-CM | POA: Diagnosis not present

## 2016-03-17 DIAGNOSIS — G35 Multiple sclerosis: Secondary | ICD-10-CM | POA: Diagnosis not present

## 2016-03-17 DIAGNOSIS — J310 Chronic rhinitis: Secondary | ICD-10-CM | POA: Diagnosis not present

## 2016-03-17 DIAGNOSIS — H578 Other specified disorders of eye and adnexa: Secondary | ICD-10-CM | POA: Diagnosis not present

## 2016-03-17 DIAGNOSIS — Z794 Long term (current) use of insulin: Secondary | ICD-10-CM | POA: Diagnosis not present

## 2016-03-17 DIAGNOSIS — M4712 Other spondylosis with myelopathy, cervical region: Secondary | ICD-10-CM | POA: Diagnosis not present

## 2016-03-17 DIAGNOSIS — Z01818 Encounter for other preprocedural examination: Secondary | ICD-10-CM | POA: Diagnosis not present

## 2016-03-18 DIAGNOSIS — D473 Essential (hemorrhagic) thrombocythemia: Secondary | ICD-10-CM | POA: Diagnosis not present

## 2016-03-21 DIAGNOSIS — D473 Essential (hemorrhagic) thrombocythemia: Secondary | ICD-10-CM | POA: Diagnosis not present

## 2016-03-22 DIAGNOSIS — Z23 Encounter for immunization: Secondary | ICD-10-CM | POA: Diagnosis not present

## 2016-03-22 DIAGNOSIS — F17218 Nicotine dependence, cigarettes, with other nicotine-induced disorders: Secondary | ICD-10-CM | POA: Diagnosis not present

## 2016-03-22 DIAGNOSIS — D693 Immune thrombocytopenic purpura: Secondary | ICD-10-CM | POA: Diagnosis not present

## 2016-03-22 DIAGNOSIS — G35 Multiple sclerosis: Secondary | ICD-10-CM | POA: Diagnosis not present

## 2016-03-22 DIAGNOSIS — E1169 Type 2 diabetes mellitus with other specified complication: Secondary | ICD-10-CM | POA: Diagnosis not present

## 2016-03-22 DIAGNOSIS — J449 Chronic obstructive pulmonary disease, unspecified: Secondary | ICD-10-CM | POA: Diagnosis not present

## 2016-03-22 DIAGNOSIS — F17210 Nicotine dependence, cigarettes, uncomplicated: Secondary | ICD-10-CM | POA: Diagnosis not present

## 2016-03-22 DIAGNOSIS — Z794 Long term (current) use of insulin: Secondary | ICD-10-CM | POA: Diagnosis not present

## 2016-03-23 DIAGNOSIS — D473 Essential (hemorrhagic) thrombocythemia: Secondary | ICD-10-CM | POA: Diagnosis not present

## 2016-03-24 DIAGNOSIS — J449 Chronic obstructive pulmonary disease, unspecified: Secondary | ICD-10-CM | POA: Diagnosis not present

## 2016-03-24 DIAGNOSIS — Z79899 Other long term (current) drug therapy: Secondary | ICD-10-CM | POA: Diagnosis not present

## 2016-03-24 DIAGNOSIS — E119 Type 2 diabetes mellitus without complications: Secondary | ICD-10-CM | POA: Diagnosis not present

## 2016-03-24 DIAGNOSIS — D693 Immune thrombocytopenic purpura: Secondary | ICD-10-CM | POA: Diagnosis not present

## 2016-03-24 DIAGNOSIS — F17210 Nicotine dependence, cigarettes, uncomplicated: Secondary | ICD-10-CM | POA: Diagnosis not present

## 2016-03-24 DIAGNOSIS — Z888 Allergy status to other drugs, medicaments and biological substances status: Secondary | ICD-10-CM | POA: Diagnosis not present

## 2016-03-24 DIAGNOSIS — Z880 Allergy status to penicillin: Secondary | ICD-10-CM | POA: Diagnosis not present

## 2016-03-24 DIAGNOSIS — Z7951 Long term (current) use of inhaled steroids: Secondary | ICD-10-CM | POA: Diagnosis not present

## 2016-03-24 DIAGNOSIS — Z881 Allergy status to other antibiotic agents status: Secondary | ICD-10-CM | POA: Diagnosis not present

## 2016-03-24 DIAGNOSIS — Z794 Long term (current) use of insulin: Secondary | ICD-10-CM | POA: Diagnosis not present

## 2016-03-28 DIAGNOSIS — D693 Immune thrombocytopenic purpura: Secondary | ICD-10-CM | POA: Diagnosis not present

## 2016-03-30 DIAGNOSIS — D693 Immune thrombocytopenic purpura: Secondary | ICD-10-CM | POA: Diagnosis not present

## 2016-04-06 ENCOUNTER — Other Ambulatory Visit: Payer: Self-pay | Admitting: Physician Assistant

## 2016-04-06 DIAGNOSIS — Z0189 Encounter for other specified special examinations: Secondary | ICD-10-CM | POA: Diagnosis not present

## 2016-04-06 DIAGNOSIS — D693 Immune thrombocytopenic purpura: Secondary | ICD-10-CM | POA: Diagnosis not present

## 2016-04-13 DIAGNOSIS — D693 Immune thrombocytopenic purpura: Secondary | ICD-10-CM | POA: Diagnosis not present

## 2016-04-20 DIAGNOSIS — Z5111 Encounter for antineoplastic chemotherapy: Secondary | ICD-10-CM | POA: Diagnosis not present

## 2016-04-20 DIAGNOSIS — D693 Immune thrombocytopenic purpura: Secondary | ICD-10-CM | POA: Diagnosis not present

## 2016-04-26 DIAGNOSIS — G35 Multiple sclerosis: Secondary | ICD-10-CM | POA: Diagnosis not present

## 2016-04-26 DIAGNOSIS — Z794 Long term (current) use of insulin: Secondary | ICD-10-CM | POA: Diagnosis not present

## 2016-04-26 DIAGNOSIS — Z881 Allergy status to other antibiotic agents status: Secondary | ICD-10-CM | POA: Diagnosis not present

## 2016-04-26 DIAGNOSIS — M549 Dorsalgia, unspecified: Secondary | ICD-10-CM | POA: Diagnosis not present

## 2016-04-26 DIAGNOSIS — Z9111 Patient's noncompliance with dietary regimen: Secondary | ICD-10-CM | POA: Diagnosis not present

## 2016-04-26 DIAGNOSIS — F17210 Nicotine dependence, cigarettes, uncomplicated: Secondary | ICD-10-CM | POA: Diagnosis not present

## 2016-04-26 DIAGNOSIS — Z79899 Other long term (current) drug therapy: Secondary | ICD-10-CM | POA: Diagnosis not present

## 2016-04-26 DIAGNOSIS — J449 Chronic obstructive pulmonary disease, unspecified: Secondary | ICD-10-CM | POA: Diagnosis not present

## 2016-04-26 DIAGNOSIS — Z880 Allergy status to penicillin: Secondary | ICD-10-CM | POA: Diagnosis not present

## 2016-04-26 DIAGNOSIS — E1165 Type 2 diabetes mellitus with hyperglycemia: Secondary | ICD-10-CM | POA: Diagnosis not present

## 2016-04-26 DIAGNOSIS — D473 Essential (hemorrhagic) thrombocythemia: Secondary | ICD-10-CM | POA: Diagnosis not present

## 2016-04-26 DIAGNOSIS — Z7951 Long term (current) use of inhaled steroids: Secondary | ICD-10-CM | POA: Diagnosis not present

## 2016-04-26 DIAGNOSIS — M791 Myalgia: Secondary | ICD-10-CM | POA: Diagnosis not present

## 2016-04-26 DIAGNOSIS — D693 Immune thrombocytopenic purpura: Secondary | ICD-10-CM | POA: Diagnosis not present

## 2016-04-26 DIAGNOSIS — G8929 Other chronic pain: Secondary | ICD-10-CM | POA: Diagnosis not present

## 2016-04-26 DIAGNOSIS — M62838 Other muscle spasm: Secondary | ICD-10-CM | POA: Diagnosis not present

## 2016-04-26 DIAGNOSIS — Z888 Allergy status to other drugs, medicaments and biological substances status: Secondary | ICD-10-CM | POA: Diagnosis not present

## 2016-04-28 DIAGNOSIS — D649 Anemia, unspecified: Secondary | ICD-10-CM | POA: Diagnosis not present

## 2016-04-28 DIAGNOSIS — E1142 Type 2 diabetes mellitus with diabetic polyneuropathy: Secondary | ICD-10-CM | POA: Diagnosis not present

## 2016-04-28 DIAGNOSIS — G43009 Migraine without aura, not intractable, without status migrainosus: Secondary | ICD-10-CM | POA: Diagnosis not present

## 2016-04-28 DIAGNOSIS — M5481 Occipital neuralgia: Secondary | ICD-10-CM | POA: Diagnosis not present

## 2016-04-28 DIAGNOSIS — G35 Multiple sclerosis: Secondary | ICD-10-CM | POA: Diagnosis not present

## 2016-05-16 DIAGNOSIS — M4722 Other spondylosis with radiculopathy, cervical region: Secondary | ICD-10-CM | POA: Diagnosis not present

## 2016-05-16 DIAGNOSIS — M5030 Other cervical disc degeneration, unspecified cervical region: Secondary | ICD-10-CM | POA: Diagnosis not present

## 2016-05-16 DIAGNOSIS — M5136 Other intervertebral disc degeneration, lumbar region: Secondary | ICD-10-CM | POA: Diagnosis not present

## 2016-05-16 DIAGNOSIS — M4726 Other spondylosis with radiculopathy, lumbar region: Secondary | ICD-10-CM | POA: Diagnosis not present

## 2016-05-16 DIAGNOSIS — Z79891 Long term (current) use of opiate analgesic: Secondary | ICD-10-CM | POA: Diagnosis not present

## 2016-05-24 DIAGNOSIS — R05 Cough: Secondary | ICD-10-CM | POA: Diagnosis not present

## 2016-05-24 DIAGNOSIS — R0602 Shortness of breath: Secondary | ICD-10-CM | POA: Diagnosis not present

## 2016-05-24 DIAGNOSIS — J449 Chronic obstructive pulmonary disease, unspecified: Secondary | ICD-10-CM | POA: Diagnosis not present

## 2016-05-24 DIAGNOSIS — D473 Essential (hemorrhagic) thrombocythemia: Secondary | ICD-10-CM | POA: Diagnosis not present

## 2016-05-24 DIAGNOSIS — D693 Immune thrombocytopenic purpura: Secondary | ICD-10-CM | POA: Diagnosis not present

## 2016-05-24 DIAGNOSIS — R911 Solitary pulmonary nodule: Secondary | ICD-10-CM | POA: Diagnosis not present

## 2016-05-24 DIAGNOSIS — F17210 Nicotine dependence, cigarettes, uncomplicated: Secondary | ICD-10-CM | POA: Diagnosis not present

## 2016-05-24 DIAGNOSIS — R52 Pain, unspecified: Secondary | ICD-10-CM | POA: Diagnosis not present

## 2016-05-24 DIAGNOSIS — D6959 Other secondary thrombocytopenia: Secondary | ICD-10-CM | POA: Diagnosis not present

## 2016-05-24 DIAGNOSIS — Z888 Allergy status to other drugs, medicaments and biological substances status: Secondary | ICD-10-CM | POA: Diagnosis not present

## 2016-05-24 DIAGNOSIS — Z881 Allergy status to other antibiotic agents status: Secondary | ICD-10-CM | POA: Diagnosis not present

## 2016-05-24 DIAGNOSIS — Z880 Allergy status to penicillin: Secondary | ICD-10-CM | POA: Diagnosis not present

## 2016-05-24 DIAGNOSIS — Z794 Long term (current) use of insulin: Secondary | ICD-10-CM | POA: Diagnosis not present

## 2016-05-24 DIAGNOSIS — R509 Fever, unspecified: Secondary | ICD-10-CM | POA: Diagnosis not present

## 2016-05-24 DIAGNOSIS — J181 Lobar pneumonia, unspecified organism: Secondary | ICD-10-CM | POA: Diagnosis not present

## 2016-05-24 DIAGNOSIS — E1165 Type 2 diabetes mellitus with hyperglycemia: Secondary | ICD-10-CM | POA: Diagnosis not present

## 2016-06-10 ENCOUNTER — Other Ambulatory Visit: Payer: Self-pay | Admitting: Physician Assistant

## 2016-06-10 ENCOUNTER — Other Ambulatory Visit: Payer: Self-pay | Admitting: Family Medicine

## 2016-06-17 ENCOUNTER — Other Ambulatory Visit: Payer: Self-pay | Admitting: Family Medicine

## 2016-06-17 ENCOUNTER — Other Ambulatory Visit: Payer: Self-pay | Admitting: Physician Assistant

## 2016-06-17 NOTE — Progress Notes (Signed)
Will void perception for alprazolam that was dispensed December 8. Patient has not been in for her diabetic follow-up in over 6 months. We will not be prescribing as needed benzodiazepines if she is not following up for her chronic medical conditions.  Beatrice Lecher, MD

## 2016-06-21 DIAGNOSIS — Z7951 Long term (current) use of inhaled steroids: Secondary | ICD-10-CM | POA: Diagnosis not present

## 2016-06-21 DIAGNOSIS — E119 Type 2 diabetes mellitus without complications: Secondary | ICD-10-CM | POA: Diagnosis not present

## 2016-06-21 DIAGNOSIS — J449 Chronic obstructive pulmonary disease, unspecified: Secondary | ICD-10-CM | POA: Diagnosis not present

## 2016-06-21 DIAGNOSIS — Z79899 Other long term (current) drug therapy: Secondary | ICD-10-CM | POA: Diagnosis not present

## 2016-06-21 DIAGNOSIS — D473 Essential (hemorrhagic) thrombocythemia: Secondary | ICD-10-CM | POA: Diagnosis not present

## 2016-06-21 DIAGNOSIS — F17210 Nicotine dependence, cigarettes, uncomplicated: Secondary | ICD-10-CM | POA: Diagnosis not present

## 2016-06-21 DIAGNOSIS — Z880 Allergy status to penicillin: Secondary | ICD-10-CM | POA: Diagnosis not present

## 2016-06-21 DIAGNOSIS — D693 Immune thrombocytopenic purpura: Secondary | ICD-10-CM | POA: Diagnosis not present

## 2016-06-21 DIAGNOSIS — Z794 Long term (current) use of insulin: Secondary | ICD-10-CM | POA: Diagnosis not present

## 2016-06-21 DIAGNOSIS — Z881 Allergy status to other antibiotic agents status: Secondary | ICD-10-CM | POA: Diagnosis not present

## 2016-06-24 DIAGNOSIS — F17210 Nicotine dependence, cigarettes, uncomplicated: Secondary | ICD-10-CM | POA: Diagnosis not present

## 2016-06-24 DIAGNOSIS — E1165 Type 2 diabetes mellitus with hyperglycemia: Secondary | ICD-10-CM | POA: Diagnosis not present

## 2016-06-24 DIAGNOSIS — J449 Chronic obstructive pulmonary disease, unspecified: Secondary | ICD-10-CM | POA: Diagnosis not present

## 2016-06-24 DIAGNOSIS — Z794 Long term (current) use of insulin: Secondary | ICD-10-CM | POA: Diagnosis not present

## 2016-06-24 DIAGNOSIS — Z8701 Personal history of pneumonia (recurrent): Secondary | ICD-10-CM | POA: Diagnosis not present

## 2016-06-24 DIAGNOSIS — Z720 Tobacco use: Secondary | ICD-10-CM | POA: Diagnosis not present

## 2016-06-24 DIAGNOSIS — D473 Essential (hemorrhagic) thrombocythemia: Secondary | ICD-10-CM | POA: Diagnosis not present

## 2016-06-28 ENCOUNTER — Other Ambulatory Visit: Payer: Self-pay

## 2016-06-28 NOTE — Telephone Encounter (Signed)
Amber Faulkner called for a refill on gabapentin 300 mg QID, last fill on 03/09/2016 for #360 / 0 refills and Zanaflex 4 mg 1 daily last filled on 10/27/2015 # 30 / 1 refill. Please advise.

## 2016-06-29 MED ORDER — TIZANIDINE HCL 4 MG PO TABS: ORAL_TABLET | ORAL | 0 refills | Status: DC

## 2016-06-29 MED ORDER — GABAPENTIN 300 MG PO CAPS: 300.0000 mg | ORAL_CAPSULE | Freq: Four times a day (QID) | ORAL | 0 refills | Status: DC

## 2016-06-29 NOTE — Telephone Encounter (Signed)
Ok to refill 

## 2016-07-14 DIAGNOSIS — M4726 Other spondylosis with radiculopathy, lumbar region: Secondary | ICD-10-CM | POA: Diagnosis not present

## 2016-07-14 DIAGNOSIS — M5030 Other cervical disc degeneration, unspecified cervical region: Secondary | ICD-10-CM | POA: Diagnosis not present

## 2016-07-14 DIAGNOSIS — M5136 Other intervertebral disc degeneration, lumbar region: Secondary | ICD-10-CM | POA: Diagnosis not present

## 2016-07-14 DIAGNOSIS — Z79891 Long term (current) use of opiate analgesic: Secondary | ICD-10-CM | POA: Diagnosis not present

## 2016-07-14 DIAGNOSIS — M4722 Other spondylosis with radiculopathy, cervical region: Secondary | ICD-10-CM | POA: Diagnosis not present

## 2016-08-25 DIAGNOSIS — Z881 Allergy status to other antibiotic agents status: Secondary | ICD-10-CM | POA: Diagnosis not present

## 2016-08-25 DIAGNOSIS — Z794 Long term (current) use of insulin: Secondary | ICD-10-CM | POA: Diagnosis not present

## 2016-08-25 DIAGNOSIS — J449 Chronic obstructive pulmonary disease, unspecified: Secondary | ICD-10-CM | POA: Diagnosis not present

## 2016-08-25 DIAGNOSIS — D693 Immune thrombocytopenic purpura: Secondary | ICD-10-CM | POA: Diagnosis not present

## 2016-08-25 DIAGNOSIS — F17210 Nicotine dependence, cigarettes, uncomplicated: Secondary | ICD-10-CM | POA: Diagnosis not present

## 2016-08-25 DIAGNOSIS — Z7951 Long term (current) use of inhaled steroids: Secondary | ICD-10-CM | POA: Diagnosis not present

## 2016-08-25 DIAGNOSIS — E119 Type 2 diabetes mellitus without complications: Secondary | ICD-10-CM | POA: Diagnosis not present

## 2016-08-25 DIAGNOSIS — Z888 Allergy status to other drugs, medicaments and biological substances status: Secondary | ICD-10-CM | POA: Diagnosis not present

## 2016-08-25 DIAGNOSIS — Z8673 Personal history of transient ischemic attack (TIA), and cerebral infarction without residual deficits: Secondary | ICD-10-CM | POA: Diagnosis not present

## 2016-08-25 DIAGNOSIS — Z880 Allergy status to penicillin: Secondary | ICD-10-CM | POA: Diagnosis not present

## 2016-08-25 DIAGNOSIS — D473 Essential (hemorrhagic) thrombocythemia: Secondary | ICD-10-CM | POA: Diagnosis not present

## 2016-08-25 DIAGNOSIS — Z79899 Other long term (current) drug therapy: Secondary | ICD-10-CM | POA: Diagnosis not present

## 2016-08-25 DIAGNOSIS — D6959 Other secondary thrombocytopenia: Secondary | ICD-10-CM | POA: Diagnosis not present

## 2016-08-29 ENCOUNTER — Ambulatory Visit: Payer: Medicare Other | Admitting: Family Medicine

## 2016-08-31 ENCOUNTER — Other Ambulatory Visit: Payer: Self-pay | Admitting: Physician Assistant

## 2016-09-06 ENCOUNTER — Telehealth: Payer: Self-pay | Admitting: Family Medicine

## 2016-09-06 NOTE — Telephone Encounter (Signed)
Flu shot no answer/vm

## 2016-09-08 DIAGNOSIS — M5136 Other intervertebral disc degeneration, lumbar region: Secondary | ICD-10-CM | POA: Diagnosis not present

## 2016-09-08 DIAGNOSIS — M791 Myalgia: Secondary | ICD-10-CM | POA: Diagnosis not present

## 2016-09-08 DIAGNOSIS — M4722 Other spondylosis with radiculopathy, cervical region: Secondary | ICD-10-CM | POA: Diagnosis not present

## 2016-09-08 DIAGNOSIS — M4726 Other spondylosis with radiculopathy, lumbar region: Secondary | ICD-10-CM | POA: Diagnosis not present

## 2016-09-08 DIAGNOSIS — M5030 Other cervical disc degeneration, unspecified cervical region: Secondary | ICD-10-CM | POA: Diagnosis not present

## 2016-09-08 HISTORY — PX: SPLENECTOMY: SUR1306

## 2016-09-09 DIAGNOSIS — Z7951 Long term (current) use of inhaled steroids: Secondary | ICD-10-CM | POA: Diagnosis not present

## 2016-09-09 DIAGNOSIS — Z888 Allergy status to other drugs, medicaments and biological substances status: Secondary | ICD-10-CM | POA: Diagnosis not present

## 2016-09-09 DIAGNOSIS — E1165 Type 2 diabetes mellitus with hyperglycemia: Secondary | ICD-10-CM | POA: Diagnosis not present

## 2016-09-09 DIAGNOSIS — J449 Chronic obstructive pulmonary disease, unspecified: Secondary | ICD-10-CM | POA: Diagnosis not present

## 2016-09-09 DIAGNOSIS — Z794 Long term (current) use of insulin: Secondary | ICD-10-CM | POA: Diagnosis not present

## 2016-09-09 DIAGNOSIS — Z79899 Other long term (current) drug therapy: Secondary | ICD-10-CM | POA: Diagnosis not present

## 2016-09-09 DIAGNOSIS — Z8673 Personal history of transient ischemic attack (TIA), and cerebral infarction without residual deficits: Secondary | ICD-10-CM | POA: Diagnosis not present

## 2016-09-09 DIAGNOSIS — Z880 Allergy status to penicillin: Secondary | ICD-10-CM | POA: Diagnosis not present

## 2016-09-09 DIAGNOSIS — D473 Essential (hemorrhagic) thrombocythemia: Secondary | ICD-10-CM | POA: Diagnosis not present

## 2016-09-09 DIAGNOSIS — F17210 Nicotine dependence, cigarettes, uncomplicated: Secondary | ICD-10-CM | POA: Diagnosis not present

## 2016-09-09 DIAGNOSIS — Z881 Allergy status to other antibiotic agents status: Secondary | ICD-10-CM | POA: Diagnosis not present

## 2016-09-09 DIAGNOSIS — Z23 Encounter for immunization: Secondary | ICD-10-CM | POA: Diagnosis not present

## 2016-10-13 DIAGNOSIS — G43009 Migraine without aura, not intractable, without status migrainosus: Secondary | ICD-10-CM | POA: Diagnosis not present

## 2016-10-13 DIAGNOSIS — M5481 Occipital neuralgia: Secondary | ICD-10-CM | POA: Diagnosis not present

## 2016-10-13 DIAGNOSIS — G35 Multiple sclerosis: Secondary | ICD-10-CM | POA: Diagnosis not present

## 2016-10-13 DIAGNOSIS — E1142 Type 2 diabetes mellitus with diabetic polyneuropathy: Secondary | ICD-10-CM | POA: Diagnosis not present

## 2016-10-13 DIAGNOSIS — M545 Low back pain: Secondary | ICD-10-CM | POA: Diagnosis not present

## 2016-10-28 DIAGNOSIS — F17210 Nicotine dependence, cigarettes, uncomplicated: Secondary | ICD-10-CM | POA: Diagnosis not present

## 2016-10-28 DIAGNOSIS — Z23 Encounter for immunization: Secondary | ICD-10-CM | POA: Diagnosis not present

## 2016-10-28 DIAGNOSIS — D693 Immune thrombocytopenic purpura: Secondary | ICD-10-CM | POA: Diagnosis not present

## 2016-10-28 DIAGNOSIS — D473 Essential (hemorrhagic) thrombocythemia: Secondary | ICD-10-CM | POA: Diagnosis not present

## 2016-10-28 DIAGNOSIS — J449 Chronic obstructive pulmonary disease, unspecified: Secondary | ICD-10-CM | POA: Diagnosis not present

## 2016-11-03 DIAGNOSIS — M4726 Other spondylosis with radiculopathy, lumbar region: Secondary | ICD-10-CM | POA: Diagnosis not present

## 2016-11-03 DIAGNOSIS — M4722 Other spondylosis with radiculopathy, cervical region: Secondary | ICD-10-CM | POA: Diagnosis not present

## 2016-11-03 DIAGNOSIS — M791 Myalgia: Secondary | ICD-10-CM | POA: Diagnosis not present

## 2016-11-03 DIAGNOSIS — M5030 Other cervical disc degeneration, unspecified cervical region: Secondary | ICD-10-CM | POA: Diagnosis not present

## 2016-11-03 DIAGNOSIS — M5136 Other intervertebral disc degeneration, lumbar region: Secondary | ICD-10-CM | POA: Diagnosis not present

## 2016-11-03 DIAGNOSIS — G894 Chronic pain syndrome: Secondary | ICD-10-CM | POA: Diagnosis not present

## 2016-11-10 DIAGNOSIS — G35 Multiple sclerosis: Secondary | ICD-10-CM | POA: Diagnosis not present

## 2016-11-10 DIAGNOSIS — M5481 Occipital neuralgia: Secondary | ICD-10-CM | POA: Diagnosis not present

## 2016-11-10 DIAGNOSIS — M62838 Other muscle spasm: Secondary | ICD-10-CM | POA: Diagnosis not present

## 2016-11-10 DIAGNOSIS — G603 Idiopathic progressive neuropathy: Secondary | ICD-10-CM | POA: Diagnosis not present

## 2016-11-21 DIAGNOSIS — D693 Immune thrombocytopenic purpura: Secondary | ICD-10-CM | POA: Diagnosis not present

## 2016-11-21 DIAGNOSIS — M4710 Other spondylosis with myelopathy, site unspecified: Secondary | ICD-10-CM | POA: Diagnosis not present

## 2016-11-21 DIAGNOSIS — J449 Chronic obstructive pulmonary disease, unspecified: Secondary | ICD-10-CM | POA: Diagnosis not present

## 2016-11-23 DIAGNOSIS — E1142 Type 2 diabetes mellitus with diabetic polyneuropathy: Secondary | ICD-10-CM | POA: Diagnosis not present

## 2016-11-23 DIAGNOSIS — R4182 Altered mental status, unspecified: Secondary | ICD-10-CM | POA: Diagnosis not present

## 2016-11-23 DIAGNOSIS — G43009 Migraine without aura, not intractable, without status migrainosus: Secondary | ICD-10-CM | POA: Diagnosis not present

## 2016-11-23 DIAGNOSIS — G35 Multiple sclerosis: Secondary | ICD-10-CM | POA: Diagnosis not present

## 2016-11-28 DIAGNOSIS — M4710 Other spondylosis with myelopathy, site unspecified: Secondary | ICD-10-CM | POA: Diagnosis not present

## 2016-11-28 DIAGNOSIS — D693 Immune thrombocytopenic purpura: Secondary | ICD-10-CM | POA: Diagnosis not present

## 2016-11-28 DIAGNOSIS — J449 Chronic obstructive pulmonary disease, unspecified: Secondary | ICD-10-CM | POA: Diagnosis not present

## 2016-12-26 ENCOUNTER — Other Ambulatory Visit: Payer: Self-pay | Admitting: Family Medicine

## 2016-12-28 DIAGNOSIS — M4722 Other spondylosis with radiculopathy, cervical region: Secondary | ICD-10-CM | POA: Diagnosis not present

## 2016-12-28 DIAGNOSIS — Z79891 Long term (current) use of opiate analgesic: Secondary | ICD-10-CM | POA: Diagnosis not present

## 2016-12-28 DIAGNOSIS — M5030 Other cervical disc degeneration, unspecified cervical region: Secondary | ICD-10-CM | POA: Diagnosis not present

## 2016-12-28 DIAGNOSIS — M5136 Other intervertebral disc degeneration, lumbar region: Secondary | ICD-10-CM | POA: Diagnosis not present

## 2016-12-28 DIAGNOSIS — M4726 Other spondylosis with radiculopathy, lumbar region: Secondary | ICD-10-CM | POA: Diagnosis not present

## 2017-01-04 ENCOUNTER — Telehealth: Payer: Self-pay | Admitting: Family Medicine

## 2017-01-04 NOTE — Telephone Encounter (Addendum)
[facility] called. They are checking status of a first time request by patient for fluocinonide 0.1% cream(for scar) and Lidocaine Ointment 5%. Request was faxed over on 6/25,.  Thank you.  Tresa Moore can be reached at [phone]/ZN'[email or web address] ID/ref # [id number]

## 2017-01-04 NOTE — Telephone Encounter (Signed)
Pt is going to need OV due to these meds never having been written by our office & because the pt hasn't been seen in over a year.

## 2017-01-05 ENCOUNTER — Ambulatory Visit (INDEPENDENT_AMBULATORY_CARE_PROVIDER_SITE_OTHER): Payer: Medicare Other | Admitting: Family Medicine

## 2017-01-05 ENCOUNTER — Encounter: Payer: Self-pay | Admitting: Family Medicine

## 2017-01-05 VITALS — BP 138/100 | HR 81 | Ht 64.96 in | Wt 196.0 lb

## 2017-01-05 DIAGNOSIS — I10 Essential (primary) hypertension: Secondary | ICD-10-CM | POA: Diagnosis not present

## 2017-01-05 DIAGNOSIS — R809 Proteinuria, unspecified: Secondary | ICD-10-CM

## 2017-01-05 DIAGNOSIS — E1165 Type 2 diabetes mellitus with hyperglycemia: Secondary | ICD-10-CM | POA: Diagnosis not present

## 2017-01-05 DIAGNOSIS — E1142 Type 2 diabetes mellitus with diabetic polyneuropathy: Secondary | ICD-10-CM

## 2017-01-05 DIAGNOSIS — J449 Chronic obstructive pulmonary disease, unspecified: Secondary | ICD-10-CM

## 2017-01-05 DIAGNOSIS — D696 Thrombocytopenia, unspecified: Secondary | ICD-10-CM

## 2017-01-05 DIAGNOSIS — F5101 Primary insomnia: Secondary | ICD-10-CM

## 2017-01-05 DIAGNOSIS — Z794 Long term (current) use of insulin: Secondary | ICD-10-CM

## 2017-01-05 DIAGNOSIS — E1129 Type 2 diabetes mellitus with other diabetic kidney complication: Secondary | ICD-10-CM | POA: Diagnosis not present

## 2017-01-05 DIAGNOSIS — IMO0002 Reserved for concepts with insufficient information to code with codable children: Secondary | ICD-10-CM

## 2017-01-05 LAB — CBC WITH DIFFERENTIAL/PLATELET
Basophils Absolute: 118 cells/uL (ref 0–200)
Basophils Relative: 1 %
EOS PCT: 3 %
Eosinophils Absolute: 354 cells/uL (ref 15–500)
HCT: 41.3 % (ref 35.0–45.0)
Hemoglobin: 13.1 g/dL (ref 11.7–15.5)
Lymphocytes Relative: 28 %
Lymphs Abs: 3304 cells/uL (ref 850–3900)
MCH: 28.3 pg (ref 27.0–33.0)
MCHC: 31.7 g/dL — AB (ref 32.0–36.0)
MCV: 89.2 fL (ref 80.0–100.0)
MONOS PCT: 13 %
MPV: 11.2 fL (ref 7.5–12.5)
Monocytes Absolute: 1534 cells/uL — ABNORMAL HIGH (ref 200–950)
NEUTROS ABS: 6490 {cells}/uL (ref 1500–7800)
Neutrophils Relative %: 55 %
PLATELETS: 113 10*3/uL — AB (ref 140–400)
RBC: 4.63 MIL/uL (ref 3.80–5.10)
RDW: 15.2 % — ABNORMAL HIGH (ref 11.0–15.0)
WBC: 11.8 10*3/uL — ABNORMAL HIGH (ref 3.8–10.8)

## 2017-01-05 LAB — POCT UA - MICROALBUMIN
Creatinine, POC: 300 mg/dL
Microalbumin Ur, POC: 80 mg/L

## 2017-01-05 LAB — POCT GLYCOSYLATED HEMOGLOBIN (HGB A1C): HEMOGLOBIN A1C: 7.8

## 2017-01-05 MED ORDER — ROSUVASTATIN CALCIUM 20 MG PO TABS: ORAL_TABLET | ORAL | 3 refills | Status: DC

## 2017-01-05 MED ORDER — QUETIAPINE FUMARATE 200 MG PO TABS: 200.0000 mg | ORAL_TABLET | Freq: Every day | ORAL | 1 refills | Status: DC

## 2017-01-05 MED ORDER — INSULIN ASPART 100 UNIT/ML FLEXPEN: PEN_INJECTOR | SUBCUTANEOUS | 3 refills | Status: DC

## 2017-01-05 MED ORDER — VENLAFAXINE HCL ER 75 MG PO CP24: ORAL_CAPSULE | ORAL | 1 refills | Status: DC

## 2017-01-05 MED ORDER — ADVAIR DISKUS 250-50 MCG/DOSE IN AEPB: INHALATION_SPRAY | RESPIRATORY_TRACT | 11 refills | Status: DC

## 2017-01-05 MED ORDER — ALBUTEROL SULFATE HFA 108 (90 BASE) MCG/ACT IN AERS: 2.0000 | INHALATION_SPRAY | Freq: Four times a day (QID) | RESPIRATORY_TRACT | 4 refills | Status: DC | PRN

## 2017-01-05 MED ORDER — INSULIN DETEMIR 100 UNIT/ML FLEXPEN: 14.0000 [IU] | PEN_INJECTOR | Freq: Every day | SUBCUTANEOUS | 1 refills | Status: DC

## 2017-01-05 MED ORDER — TIZANIDINE HCL 4 MG PO TABS: 4.0000 mg | ORAL_TABLET | Freq: Every day | ORAL | 5 refills | Status: DC

## 2017-01-05 MED ORDER — OMEPRAZOLE 40 MG PO CPDR: 40.0000 mg | DELAYED_RELEASE_CAPSULE | Freq: Every day | ORAL | 3 refills | Status: DC

## 2017-01-05 MED ORDER — SUCRALFATE 1 G PO TABS: 1.0000 g | ORAL_TABLET | Freq: Three times a day (TID) | ORAL | 5 refills | Status: DC

## 2017-01-05 MED ORDER — GABAPENTIN 300 MG PO CAPS: 300.0000 mg | ORAL_CAPSULE | Freq: Four times a day (QID) | ORAL | 1 refills | Status: DC

## 2017-01-05 MED ORDER — SPIRIVA HANDIHALER 18 MCG IN CAPS: ORAL_CAPSULE | RESPIRATORY_TRACT | 3 refills | Status: DC

## 2017-01-05 MED ORDER — LISINOPRIL 10 MG PO TABS: 10.0000 mg | ORAL_TABLET | Freq: Every day | ORAL | 3 refills | Status: DC

## 2017-01-05 NOTE — Progress Notes (Signed)
Subjective:    Patient ID: Amber Faulkner, female    DOB: 25-Jul-1960, 56 y.o.   MRN: [id number]  HPI She's had ongoing thrombus AP for quite some time. She's done infusion therapies anything chemotherapy. More recently they decided to do a splenectomy. Had splenectomy done at [facility].  She was told to stop her aspirin. White to have her platelets rechecked since she has not had this done since surgery. This is what has delayed her having neck and back surgery for her spine.  Diabetes - no hypoglycemic events. No wounds or sores that are not healing well. No increased thirst or urination. Checking glucose at home. Taking medications as prescribed without any side effects. She says she is down to 14 units on her Levemir. She started become fearful that she was going to become hypoglycemic in her sleep and so had spoken to her surgeon about decreasing her dose. She still uses her Humalog for sliding scale.  Insomnia-she like a refill on her Seroquel. She's been out of medication for quite some time. She tried multiple prescription agents over the years and is the only thing that really helps her. She also has underlying depression and anxiety which I suspect drives a lot of her insomnia. She's been taking high doses of over-the-counter medications  Chronic pain-she follows with Dr. Kriste Basque for chronic pain management. She is currently on oxycodone.  Moderate COPD-she has been out of her Advair and Spiriva for quite some time. She is wanting to get back on it. She does have a chronic cough which is not new. She says most the time if she hears a wheeze in her chest she can cough and clear.  She would be she would like to be restarted on her alprazolam for tremor and her anxiety.  Review of Systems   BP (!) 138/100   Pulse 81   Ht 5' 4.96" (1.65 m)   Wt 196 lb (88.9 kg)   SpO2 95%   BMI 32.66 kg/m     Allergies  Allergen Reactions  . Cephalosporins Anaphylaxis  . Vancomycin  Anaphylaxis  . Penicillins Hives and Rash    Past Medical History:  Diagnosis Date  . ADD (attention deficit disorder)   . Anxiety   . Chronic pain   . Diabetes mellitus   . HIV infection ([place])   . Hyperlipidemia   . Neuromuscular disorder [facility])     Past Surgical History:  Procedure Laterality Date  . ABDOMINAL HYSTERECTOMY  1991   complete  . CARPAL TUNNEL RELEASE  1993-1994   both wrist  . CESAREAN SECTION  1980  . HERNIA REPAIR  1976  . TARSAL TUNNEL RELEASE  1993   left ankle  . TUMOR REMOVAL  1993   left thigh    Social History   Social History  . Marital status: Single    Spouse name: N/A  . Number of children: N/A  . Years of education: N/A   Occupational History  . Not on file.   Social History Main Topics  . Smoking status: Current Every Day Smoker    Packs/day: 1.50    Types: Cigarettes  . Smokeless tobacco: Never Used  . Alcohol use No  . Drug use: No  . Sexual activity: Not on file   Other Topics Concern  . Not on file   Social History Narrative  . No narrative on file    Family History  Problem Relation Age of Onset  .  Cancer Mother        brain and lung  . COPD Mother   . Hyperlipidemia Father   . Hypertension Father   . Diabetes Father   . Heart disease Father   . Cancer Sister        hepatic and pancreatic  . Cancer Maternal Aunt        breast    Outpatient Encounter Prescriptions as of 01/05/2017  Medication Sig  . ADVAIR DISKUS 250-50 MCG/DOSE AEPB Inhale 1 puff into the lungs 2 times daily.  Marland Kitchen albuterol (VENTOLIN HFA) 108 (90 Base) MCG/ACT inhaler Inhale 2 puffs into the lungs every 6 (six) hours as needed for wheezing.  Marland Kitchen EASY TOUCH PEN NEEDLES 32G X 6 MM MISC USE AS DIRECTED  . gabapentin (NEURONTIN) 300 MG capsule Take 1 capsule (300 mg total) by mouth 4 (four) times daily.  . insulin aspart (NOVOLOG FLEXPEN) 100 UNIT/ML FlexPen Inject 15 Units into the skin 3 (three) times daily before meals - Use sliding scale  .  Insulin Detemir (LEVEMIR FLEXTOUCH) 100 UNIT/ML Pen Inject 14-20 Units into the skin at bedtime.  Marland Kitchen lacosamide (VIMPAT) 50 MG TABS tablet Take 1 tablet by mouth 2 (two) times daily.  Marland Kitchen omeprazole (PRILOSEC) 40 MG capsule Take 1 capsule (40 mg total) by mouth daily.  . ondansetron (ZOFRAN-ODT) 8 MG disintegrating tablet Take 1 tablet (8 mg total) by mouth every 8 (eight) hours as needed for nausea or vomiting.  Marland Kitchen oxyCODONE (ROXICODONE) 15 MG immediate release tablet Take 15 mg by mouth.  . QUEtiapine (SEROQUEL) 200 MG tablet Take 1 tablet (200 mg total) by mouth at bedtime. Take 2 tablets at bedtime.  . rosuvastatin (CRESTOR) 20 MG tablet Take 1 tablet (20 mg total) by mouth daily.  Marland Kitchen SPIRIVA HANDIHALER 18 MCG inhalation capsule Place 1 capsule into inhaler and inhale daily.  . sucralfate (CARAFATE) 1 g tablet Take 1 tablet (1 g total) by mouth 4 (four) times daily -  with meals and at bedtime.  Marland Kitchen tiZANidine (ZANAFLEX) 4 MG tablet Take 1 tablet (4 mg total) by mouth at bedtime. TAKE 1 TABLET DAILY AS NEEDED MUSCLE SPASMS  . venlafaxine XR (EFFEXOR-XR) 75 MG 24 hr capsule Take 1 capsule (75 mg total) by mouth daily with breakfast.  . [DISCONTINUED] ADVAIR DISKUS 250-50 MCG/DOSE AEPB Inhale 1 puff into the lungs 2 times daily.  . [DISCONTINUED] albuterol (VENTOLIN HFA) 108 (90 BASE) MCG/ACT inhaler Inhale 2 puffs into the lungs every 6 (six) hours as needed for wheezing.  . [DISCONTINUED] ALPRAZolam (XANAX) 0.5 MG tablet TAKE 1 TABLET BY MOUTH TWICE DAILY AS NEEDED FOR ANXIETY  . [DISCONTINUED] gabapentin (NEURONTIN) 300 MG capsule Take 1 capsule (300 mg total) by mouth 4 (four) times daily. *PATIENT MUST SCHEDULE AN APPOINTMENT FOR REFILLS. NO REFILLS WILL BE GIVEN*  . [DISCONTINUED] Insulin Detemir (LEVEMIR FLEXTOUCH) 100 UNIT/ML Pen INJECT 40 UNITS INTO THE SKIN AT BEDTIME. ADD 2 UNITS EVERY 2 DAYS UNTIL GET TO 120 OR LESS FASTING BLOOD SUGAR. *NO ADDITIONAL REFILLS. PLEASE CALL AND SCHEDULE AN  APPOINTMENT.*  . [DISCONTINUED] NOVOLOG FLEXPEN 100 UNIT/ML FlexPen Inject 15 Units into the skin 3 (three) times daily before meals - Use sliding scale  . [DISCONTINUED] omeprazole (PRILOSEC) 40 MG capsule Take 1 capsule (40 mg total) by mouth daily.  . [DISCONTINUED] QUEtiapine (SEROQUEL) 100 MG tablet Take 2 tablets at bedtime.  . [DISCONTINUED] rosuvastatin (CRESTOR) 20 MG tablet Take 1 tablet (20 mg total) by mouth daily.  . [  DISCONTINUED] SPIRIVA HANDIHALER 18 MCG inhalation capsule Place 1 capsule into inhaler and inhale daily.  . [DISCONTINUED] sucralfate (CARAFATE) 1 g tablet Take 1 tablet (1 g total) by mouth 4 (four) times daily -  with meals and at bedtime.  . [DISCONTINUED] tiZANidine (ZANAFLEX) 4 MG tablet TAKE 1 TABLET DAILY AS NEEDED MUSCLE SPASMS  . [DISCONTINUED] venlafaxine XR (EFFEXOR-XR) 75 MG 24 hr capsule Take 1 capsule (75 mg total) by mouth daily with breakfast.  . lisinopril (PRINIVIL,ZESTRIL) 10 MG tablet Take 1 tablet (10 mg total) by mouth daily.  . [DISCONTINUED] aspirin EC 81 MG tablet Take 81 mg by mouth daily.  . [DISCONTINUED] oxycodone (ROXICODONE) 30 MG immediate release tablet Take 30 mg by mouth every 4 (four) hours as needed.   No facility-administered encounter medications on file as of 01/05/2017.          Objective:   Physical Exam  Constitutional: She is oriented to person, place, and time. She appears well-developed and well-nourished.  HENT:  Head: Normocephalic and atraumatic.  Right Ear: External ear normal.  Left Ear: External ear normal.  Eyes: Conjunctivae are normal.  Cardiovascular: Normal rate, regular rhythm and normal heart sounds.   Pulmonary/Chest: Effort normal.  Coarse breath sounds bilaterally.  Musculoskeletal: She exhibits no edema.  Neurological: She is alert and oriented to person, place, and time.  Skin: Skin is warm and dry. No rash noted.  Psychiatric: She has a normal mood and affect. Her behavior is normal.         Assessment & Plan:  DM- Uncontrolled but A1c is better than previous. Down to 7.8 which is fantastic. She is actually only using 14 units of Levemir and then using her sliding scale. Try increasing to 16 units and monitor for any hypoglycemia. Continue work on [employer] and regular exercise.  Insomnia-continue with Seroquel. Stop over-the-counter medications.  Thrombocytopenia-due to recheck lipid platelets after recent splenectomy.   Chronic pain management - Sees DR. Kriste Basque.   Hypertension- New DX/. Blood pressure was high today and it was high when I last saw her about a year ago. She also reports been high at other doctor's offices.  Proteinuria-she is now showing spilling of protein in her urine which we checked because of her diabetes. Recommend starting an ACE inhibitor.  Moderate COPD-continue with Advair and Spiriva. Will restart medications new prescription sent.   Tremor/anxiety-  Patient we would not be able to restart her alprazolam because she is on chronic narcotics. There is an increased risk of death with homogeneous medications. I would like to start by just trying to get her back on her regular medications and get her sleep quality improved and see how she's feeling. Could consider the addition of a beta blocker if needed.

## 2017-01-05 NOTE — Patient Instructions (Addendum)
Try increasing the Levemir to 16 units.  A1C is still a little high.   Will start lisinopril to help protect her kidneys. You are spilling some protein and her blood pressures been a little high. This will help with both.

## 2017-01-06 LAB — COMPLETE METABOLIC PANEL WITH GFR
ALBUMIN: 4.2 g/dL (ref 3.6–5.1)
ALK PHOS: 155 U/L — AB (ref 33–130)
ALT: 12 U/L (ref 6–29)
AST: 12 U/L (ref 10–35)
BUN: 20 mg/dL (ref 7–25)
CALCIUM: 8.9 mg/dL (ref 8.6–10.4)
CO2: 19 mmol/L — ABNORMAL LOW (ref 20–31)
CREATININE: 0.48 mg/dL — AB (ref 0.50–1.05)
Chloride: 107 mmol/L (ref 98–110)
GFR, Est African American: >89 mL/min (ref >=60)
GFR, Est Non African American: >89 mL/min (ref >=60)
Glucose, Bld: 144 mg/dL — ABNORMAL HIGH (ref 65–99)
Potassium: 3.9 mmol/L (ref 3.5–5.3)
Sodium: 138 mmol/L (ref 135–146)
TOTAL PROTEIN: 6.5 g/dL (ref 6.1–8.1)
Total Bilirubin: 0.3 mg/dL (ref 0.2–1.2)

## 2017-01-06 LAB — LIPID PANEL
CHOLESTEROL: 151 mg/dL (ref <200)
HDL: 65 mg/dL (ref >50)
LDL Cholesterol: 69 mg/dL (ref <100)
Total CHOL/HDL Ratio: 2.3 Ratio (ref <5.0)
Triglycerides: 84 mg/dL (ref <150)
VLDL: 17 mg/dL (ref <30)

## 2017-01-13 ENCOUNTER — Telehealth: Payer: Self-pay | Admitting: *Deleted

## 2017-01-13 NOTE — Telephone Encounter (Signed)
Pt stated that she remembers taking lisinopril before and that she was taken off of this due to them thinking that this is what may have caused her stroke. She reports that since she has been taking this she has feeling like her heart has been racing, nauseated, dizzy. She doesn't want to take this anymore. Advised her to stop taking and will add to allergy/intolerance list.   Pt stated that the Tizanidine is ineffective for her. She states that she knows that at the last OV it was discussed about mixing benzo's and narcotic med's and that is why this was given. however, she reports that this has caused her to feel like she has not taken anything at all. she wanted to know if there is something else that can be given that won't react with what she already takes.   Also she wanted to report that she has fallen 2x. She did not seek care either time. She feels that she had a seizure both times. She will see Dr. Berdine Addison Wednesday and will have an ECG done then. She is known for having altered mental states. She believe that she broke her toe and bruised her back over her L rib.    Will fwd to pcp for advice.Amber Faulkner [place]

## 2017-01-14 MED ORDER — LOSARTAN POTASSIUM 25 MG PO TABS: 25.0000 mg | ORAL_TABLET | Freq: Every day | ORAL | 1 refills | Status: DC

## 2017-01-14 NOTE — Telephone Encounter (Signed)
OK. Let her know we added it to her intolerance list. Will try losartan instead. It is in a different class of med.  I would recommend she discuss her frequent falls with Dr. Berdine Addison. WE can get her in next week if she thinks she has broken her toe.we can try flexeril. I think she took it once before. She can also talk to her pain doc about muscle relaxers too.

## 2017-01-16 MED ORDER — CYCLOBENZAPRINE HCL 10 MG PO TABS: 10.0000 mg | ORAL_TABLET | Freq: Every evening | ORAL | 1 refills | Status: DC | PRN

## 2017-01-16 NOTE — Telephone Encounter (Signed)
We can start with flexeril QHS.

## 2017-01-16 NOTE — Telephone Encounter (Signed)
Patient advised of recommendations. She does not want to come in for her toe even though she does believe she has broke her toe.   She would like to start the flexeril TID. Please advise.

## 2017-01-17 NOTE — Telephone Encounter (Signed)
Recommendations left on vm -EH/RMA  

## 2017-01-18 DIAGNOSIS — G43009 Migraine without aura, not intractable, without status migrainosus: Secondary | ICD-10-CM | POA: Diagnosis not present

## 2017-01-18 DIAGNOSIS — G35 Multiple sclerosis: Secondary | ICD-10-CM | POA: Diagnosis not present

## 2017-01-19 ENCOUNTER — Telehealth: Payer: Self-pay | Admitting: Family Medicine

## 2017-01-19 MED ORDER — INSULIN LISPRO 100 UNIT/ML (KWIKPEN): 15.0000 [IU] | PEN_INJECTOR | Freq: Three times a day (TID) | SUBCUTANEOUS | 1 refills | Status: DC

## 2017-01-19 MED ORDER — ALBUTEROL SULFATE HFA 108 (90 BASE) MCG/ACT IN AERS: 2.0000 | INHALATION_SPRAY | Freq: Four times a day (QID) | RESPIRATORY_TRACT | 5 refills | Status: DC | PRN

## 2017-01-19 NOTE — Telephone Encounter (Signed)
Call pt: insurance won't cover novolog but wil cover humalog.  I will change and send new Rx.

## 2017-01-19 NOTE — Telephone Encounter (Signed)
Call pt: ventolin is not going to be covered with her insurance. They will cover proair. Will send a rx for Proair.

## 2017-01-20 NOTE — Telephone Encounter (Signed)
Pt notified -EH/RMA  

## 2017-02-02 DIAGNOSIS — M5481 Occipital neuralgia: Secondary | ICD-10-CM | POA: Diagnosis not present

## 2017-02-02 DIAGNOSIS — E1142 Type 2 diabetes mellitus with diabetic polyneuropathy: Secondary | ICD-10-CM | POA: Diagnosis not present

## 2017-02-02 DIAGNOSIS — G35 Multiple sclerosis: Secondary | ICD-10-CM | POA: Diagnosis not present

## 2017-02-02 DIAGNOSIS — D649 Anemia, unspecified: Secondary | ICD-10-CM | POA: Diagnosis not present

## 2017-02-20 ENCOUNTER — Other Ambulatory Visit: Payer: Self-pay | Admitting: Family Medicine

## 2017-03-02 DIAGNOSIS — M5030 Other cervical disc degeneration, unspecified cervical region: Secondary | ICD-10-CM | POA: Diagnosis not present

## 2017-03-02 DIAGNOSIS — M5136 Other intervertebral disc degeneration, lumbar region: Secondary | ICD-10-CM | POA: Diagnosis not present

## 2017-03-02 DIAGNOSIS — M4722 Other spondylosis with radiculopathy, cervical region: Secondary | ICD-10-CM | POA: Diagnosis not present

## 2017-03-02 DIAGNOSIS — M4726 Other spondylosis with radiculopathy, lumbar region: Secondary | ICD-10-CM | POA: Diagnosis not present

## 2017-03-02 DIAGNOSIS — Z79891 Long term (current) use of opiate analgesic: Secondary | ICD-10-CM | POA: Diagnosis not present

## 2017-03-22 DIAGNOSIS — E860 Dehydration: Secondary | ICD-10-CM | POA: Diagnosis not present

## 2017-03-22 DIAGNOSIS — D693 Immune thrombocytopenic purpura: Secondary | ICD-10-CM | POA: Diagnosis not present

## 2017-03-22 DIAGNOSIS — M7989 Other specified soft tissue disorders: Secondary | ICD-10-CM | POA: Diagnosis not present

## 2017-03-22 DIAGNOSIS — N179 Acute kidney failure, unspecified: Secondary | ICD-10-CM | POA: Diagnosis not present

## 2017-03-22 DIAGNOSIS — Z9889 Other specified postprocedural states: Secondary | ICD-10-CM | POA: Diagnosis not present

## 2017-03-22 DIAGNOSIS — J449 Chronic obstructive pulmonary disease, unspecified: Secondary | ICD-10-CM | POA: Diagnosis not present

## 2017-03-22 DIAGNOSIS — R061 Stridor: Secondary | ICD-10-CM | POA: Diagnosis not present

## 2017-03-22 DIAGNOSIS — J440 Chronic obstructive pulmonary disease with acute lower respiratory infection: Secondary | ICD-10-CM | POA: Diagnosis not present

## 2017-03-22 DIAGNOSIS — E559 Vitamin D deficiency, unspecified: Secondary | ICD-10-CM | POA: Diagnosis not present

## 2017-03-22 DIAGNOSIS — R7881 Bacteremia: Secondary | ICD-10-CM | POA: Diagnosis not present

## 2017-03-22 DIAGNOSIS — R0603 Acute respiratory distress: Secondary | ICD-10-CM | POA: Diagnosis not present

## 2017-03-22 DIAGNOSIS — R404 Transient alteration of awareness: Secondary | ICD-10-CM | POA: Diagnosis not present

## 2017-03-22 DIAGNOSIS — M549 Dorsalgia, unspecified: Secondary | ICD-10-CM | POA: Diagnosis not present

## 2017-03-22 DIAGNOSIS — R0602 Shortness of breath: Secondary | ICD-10-CM | POA: Diagnosis not present

## 2017-03-22 DIAGNOSIS — M5030 Other cervical disc degeneration, unspecified cervical region: Secondary | ICD-10-CM | POA: Diagnosis not present

## 2017-03-22 DIAGNOSIS — R0600 Dyspnea, unspecified: Secondary | ICD-10-CM | POA: Diagnosis not present

## 2017-03-22 DIAGNOSIS — J9610 Chronic respiratory failure, unspecified whether with hypoxia or hypercapnia: Secondary | ICD-10-CM | POA: Diagnosis not present

## 2017-03-22 DIAGNOSIS — I10 Essential (primary) hypertension: Secondary | ICD-10-CM | POA: Diagnosis not present

## 2017-03-22 DIAGNOSIS — M329 Systemic lupus erythematosus, unspecified: Secondary | ICD-10-CM | POA: Diagnosis not present

## 2017-03-22 DIAGNOSIS — D509 Iron deficiency anemia, unspecified: Secondary | ICD-10-CM | POA: Diagnosis not present

## 2017-03-22 DIAGNOSIS — M4802 Spinal stenosis, cervical region: Secondary | ICD-10-CM | POA: Diagnosis not present

## 2017-03-22 DIAGNOSIS — R42 Dizziness and giddiness: Secondary | ICD-10-CM | POA: Diagnosis not present

## 2017-03-22 DIAGNOSIS — M4712 Other spondylosis with myelopathy, cervical region: Secondary | ICD-10-CM | POA: Diagnosis not present

## 2017-03-22 DIAGNOSIS — Z4789 Encounter for other orthopedic aftercare: Secondary | ICD-10-CM | POA: Diagnosis not present

## 2017-03-22 DIAGNOSIS — M5126 Other intervertebral disc displacement, lumbar region: Secondary | ICD-10-CM | POA: Diagnosis not present

## 2017-03-22 DIAGNOSIS — M5380 Other specified dorsopathies, site unspecified: Secondary | ICD-10-CM | POA: Diagnosis not present

## 2017-03-22 DIAGNOSIS — L930 Discoid lupus erythematosus: Secondary | ICD-10-CM | POA: Diagnosis not present

## 2017-03-22 DIAGNOSIS — D508 Other iron deficiency anemias: Secondary | ICD-10-CM | POA: Diagnosis not present

## 2017-03-22 DIAGNOSIS — Z981 Arthrodesis status: Secondary | ICD-10-CM | POA: Diagnosis not present

## 2017-03-22 DIAGNOSIS — G35 Multiple sclerosis: Secondary | ICD-10-CM | POA: Diagnosis not present

## 2017-03-22 DIAGNOSIS — Z794 Long term (current) use of insulin: Secondary | ICD-10-CM | POA: Diagnosis not present

## 2017-03-22 DIAGNOSIS — M47892 Other spondylosis, cervical region: Secondary | ICD-10-CM | POA: Diagnosis not present

## 2017-03-22 DIAGNOSIS — G9589 Other specified diseases of spinal cord: Secondary | ICD-10-CM | POA: Diagnosis not present

## 2017-03-22 DIAGNOSIS — M4186 Other forms of scoliosis, lumbar region: Secondary | ICD-10-CM | POA: Diagnosis not present

## 2017-03-22 DIAGNOSIS — R221 Localized swelling, mass and lump, neck: Secondary | ICD-10-CM | POA: Diagnosis not present

## 2017-03-22 DIAGNOSIS — I519 Heart disease, unspecified: Secondary | ICD-10-CM | POA: Diagnosis not present

## 2017-03-22 DIAGNOSIS — R918 Other nonspecific abnormal finding of lung field: Secondary | ICD-10-CM | POA: Diagnosis not present

## 2017-03-22 DIAGNOSIS — J158 Pneumonia due to other specified bacteria: Secondary | ICD-10-CM | POA: Diagnosis not present

## 2017-03-22 DIAGNOSIS — I517 Cardiomegaly: Secondary | ICD-10-CM | POA: Diagnosis not present

## 2017-03-22 DIAGNOSIS — J189 Pneumonia, unspecified organism: Secondary | ICD-10-CM | POA: Diagnosis not present

## 2017-03-22 DIAGNOSIS — J9601 Acute respiratory failure with hypoxia: Secondary | ICD-10-CM | POA: Diagnosis not present

## 2017-03-22 DIAGNOSIS — E1142 Type 2 diabetes mellitus with diabetic polyneuropathy: Secondary | ICD-10-CM | POA: Diagnosis not present

## 2017-03-22 DIAGNOSIS — Z9081 Acquired absence of spleen: Secondary | ICD-10-CM | POA: Diagnosis not present

## 2017-03-22 DIAGNOSIS — I951 Orthostatic hypotension: Secondary | ICD-10-CM | POA: Diagnosis not present

## 2017-03-22 DIAGNOSIS — G894 Chronic pain syndrome: Secondary | ICD-10-CM | POA: Diagnosis not present

## 2017-03-22 DIAGNOSIS — M48061 Spinal stenosis, lumbar region without neurogenic claudication: Secondary | ICD-10-CM | POA: Diagnosis not present

## 2017-03-22 DIAGNOSIS — R5382 Chronic fatigue, unspecified: Secondary | ICD-10-CM | POA: Diagnosis not present

## 2017-03-22 DIAGNOSIS — R069 Unspecified abnormalities of breathing: Secondary | ICD-10-CM | POA: Diagnosis not present

## 2017-03-22 DIAGNOSIS — R0789 Other chest pain: Secondary | ICD-10-CM | POA: Diagnosis not present

## 2017-03-22 DIAGNOSIS — M5136 Other intervertebral disc degeneration, lumbar region: Secondary | ICD-10-CM | POA: Diagnosis not present

## 2017-03-22 DIAGNOSIS — D72829 Elevated white blood cell count, unspecified: Secondary | ICD-10-CM | POA: Diagnosis not present

## 2017-03-22 DIAGNOSIS — M47897 Other spondylosis, lumbosacral region: Secondary | ICD-10-CM | POA: Diagnosis not present

## 2017-03-22 DIAGNOSIS — J9811 Atelectasis: Secondary | ICD-10-CM | POA: Diagnosis not present

## 2017-03-22 DIAGNOSIS — M47896 Other spondylosis, lumbar region: Secondary | ICD-10-CM | POA: Diagnosis not present

## 2017-03-22 DIAGNOSIS — R531 Weakness: Secondary | ICD-10-CM | POA: Diagnosis not present

## 2017-03-22 DIAGNOSIS — R1310 Dysphagia, unspecified: Secondary | ICD-10-CM | POA: Diagnosis not present

## 2017-03-22 DIAGNOSIS — R600 Localized edema: Secondary | ICD-10-CM | POA: Diagnosis not present

## 2017-03-22 DIAGNOSIS — J90 Pleural effusion, not elsewhere classified: Secondary | ICD-10-CM | POA: Diagnosis not present

## 2017-03-22 DIAGNOSIS — E1165 Type 2 diabetes mellitus with hyperglycemia: Secondary | ICD-10-CM | POA: Diagnosis not present

## 2017-03-22 DIAGNOSIS — G939 Disorder of brain, unspecified: Secondary | ICD-10-CM | POA: Diagnosis not present

## 2017-03-22 DIAGNOSIS — Z720 Tobacco use: Secondary | ICD-10-CM | POA: Diagnosis not present

## 2017-03-22 DIAGNOSIS — G959 Disease of spinal cord, unspecified: Secondary | ICD-10-CM | POA: Diagnosis not present

## 2017-03-22 DIAGNOSIS — M797 Fibromyalgia: Secondary | ICD-10-CM | POA: Diagnosis not present

## 2017-03-22 DIAGNOSIS — M96843 Postprocedural seroma of a musculoskeletal structure following other procedure: Secondary | ICD-10-CM | POA: Diagnosis not present

## 2017-03-25 HISTORY — PX: CERVICAL DISCECTOMY: SHX98

## 2017-03-30 DIAGNOSIS — R061 Stridor: Secondary | ICD-10-CM | POA: Diagnosis not present

## 2017-03-30 DIAGNOSIS — J440 Chronic obstructive pulmonary disease with acute lower respiratory infection: Secondary | ICD-10-CM | POA: Diagnosis not present

## 2017-03-30 DIAGNOSIS — J90 Pleural effusion, not elsewhere classified: Secondary | ICD-10-CM | POA: Diagnosis not present

## 2017-03-30 DIAGNOSIS — E1165 Type 2 diabetes mellitus with hyperglycemia: Secondary | ICD-10-CM | POA: Diagnosis not present

## 2017-03-30 DIAGNOSIS — M329 Systemic lupus erythematosus, unspecified: Secondary | ICD-10-CM | POA: Diagnosis not present

## 2017-03-30 DIAGNOSIS — M797 Fibromyalgia: Secondary | ICD-10-CM | POA: Diagnosis not present

## 2017-03-30 DIAGNOSIS — J158 Pneumonia due to other specified bacteria: Secondary | ICD-10-CM | POA: Diagnosis not present

## 2017-03-30 DIAGNOSIS — R5382 Chronic fatigue, unspecified: Secondary | ICD-10-CM | POA: Diagnosis not present

## 2017-03-30 DIAGNOSIS — M4802 Spinal stenosis, cervical region: Secondary | ICD-10-CM | POA: Diagnosis not present

## 2017-03-30 DIAGNOSIS — J9811 Atelectasis: Secondary | ICD-10-CM | POA: Diagnosis not present

## 2017-03-30 DIAGNOSIS — R918 Other nonspecific abnormal finding of lung field: Secondary | ICD-10-CM | POA: Diagnosis not present

## 2017-03-30 DIAGNOSIS — R221 Localized swelling, mass and lump, neck: Secondary | ICD-10-CM | POA: Diagnosis not present

## 2017-03-30 DIAGNOSIS — Z9889 Other specified postprocedural states: Secondary | ICD-10-CM | POA: Diagnosis not present

## 2017-03-30 DIAGNOSIS — R7881 Bacteremia: Secondary | ICD-10-CM | POA: Diagnosis not present

## 2017-03-30 DIAGNOSIS — J189 Pneumonia, unspecified organism: Secondary | ICD-10-CM | POA: Diagnosis not present

## 2017-03-30 DIAGNOSIS — Z794 Long term (current) use of insulin: Secondary | ICD-10-CM | POA: Diagnosis not present

## 2017-03-30 DIAGNOSIS — M96843 Postprocedural seroma of a musculoskeletal structure following other procedure: Secondary | ICD-10-CM | POA: Diagnosis not present

## 2017-03-30 DIAGNOSIS — E1142 Type 2 diabetes mellitus with diabetic polyneuropathy: Secondary | ICD-10-CM | POA: Diagnosis not present

## 2017-03-30 DIAGNOSIS — R600 Localized edema: Secondary | ICD-10-CM | POA: Diagnosis not present

## 2017-03-30 DIAGNOSIS — G35 Multiple sclerosis: Secondary | ICD-10-CM | POA: Diagnosis not present

## 2017-03-30 DIAGNOSIS — D693 Immune thrombocytopenic purpura: Secondary | ICD-10-CM | POA: Diagnosis not present

## 2017-03-30 DIAGNOSIS — R0602 Shortness of breath: Secondary | ICD-10-CM | POA: Diagnosis not present

## 2017-03-30 DIAGNOSIS — I10 Essential (primary) hypertension: Secondary | ICD-10-CM | POA: Diagnosis not present

## 2017-03-30 DIAGNOSIS — Z4789 Encounter for other orthopedic aftercare: Secondary | ICD-10-CM | POA: Diagnosis not present

## 2017-03-30 DIAGNOSIS — Z9081 Acquired absence of spleen: Secondary | ICD-10-CM | POA: Diagnosis not present

## 2017-03-30 DIAGNOSIS — J9610 Chronic respiratory failure, unspecified whether with hypoxia or hypercapnia: Secondary | ICD-10-CM | POA: Diagnosis not present

## 2017-03-30 DIAGNOSIS — M7989 Other specified soft tissue disorders: Secondary | ICD-10-CM | POA: Diagnosis not present

## 2017-03-30 DIAGNOSIS — R0600 Dyspnea, unspecified: Secondary | ICD-10-CM | POA: Diagnosis not present

## 2017-04-10 ENCOUNTER — Encounter: Payer: Medicare Other | Admitting: Family Medicine

## 2017-04-10 DIAGNOSIS — Z0189 Encounter for other specified special examinations: Secondary | ICD-10-CM

## 2017-04-26 ENCOUNTER — Other Ambulatory Visit: Payer: Self-pay | Admitting: Family Medicine

## 2017-04-26 DIAGNOSIS — Z79891 Long term (current) use of opiate analgesic: Secondary | ICD-10-CM | POA: Diagnosis not present

## 2017-04-26 DIAGNOSIS — M4722 Other spondylosis with radiculopathy, cervical region: Secondary | ICD-10-CM | POA: Diagnosis not present

## 2017-04-26 DIAGNOSIS — M4726 Other spondylosis with radiculopathy, lumbar region: Secondary | ICD-10-CM | POA: Diagnosis not present

## 2017-04-26 DIAGNOSIS — M5030 Other cervical disc degeneration, unspecified cervical region: Secondary | ICD-10-CM | POA: Diagnosis not present

## 2017-04-26 DIAGNOSIS — M5136 Other intervertebral disc degeneration, lumbar region: Secondary | ICD-10-CM | POA: Diagnosis not present

## 2017-05-11 ENCOUNTER — Inpatient Hospital Stay: Payer: Medicare Other

## 2017-05-15 ENCOUNTER — Inpatient Hospital Stay: Payer: Medicare Other

## 2017-05-22 ENCOUNTER — Ambulatory Visit (INDEPENDENT_AMBULATORY_CARE_PROVIDER_SITE_OTHER): Payer: Medicare Other | Admitting: Family Medicine

## 2017-05-22 ENCOUNTER — Encounter: Payer: Self-pay | Admitting: Family Medicine

## 2017-05-22 VITALS — BP 152/92 | HR 85 | Temp 98.2°F | Wt 196.0 lb

## 2017-05-22 DIAGNOSIS — Z794 Long term (current) use of insulin: Secondary | ICD-10-CM

## 2017-05-22 DIAGNOSIS — R1312 Dysphagia, oropharyngeal phase: Secondary | ICD-10-CM

## 2017-05-22 DIAGNOSIS — R809 Proteinuria, unspecified: Secondary | ICD-10-CM

## 2017-05-22 DIAGNOSIS — E1165 Type 2 diabetes mellitus with hyperglycemia: Secondary | ICD-10-CM | POA: Diagnosis not present

## 2017-05-22 DIAGNOSIS — M25512 Pain in left shoulder: Secondary | ICD-10-CM

## 2017-05-22 DIAGNOSIS — M25511 Pain in right shoulder: Secondary | ICD-10-CM | POA: Diagnosis not present

## 2017-05-22 DIAGNOSIS — IMO0002 Reserved for concepts with insufficient information to code with codable children: Secondary | ICD-10-CM

## 2017-05-22 DIAGNOSIS — E1129 Type 2 diabetes mellitus with other diabetic kidney complication: Secondary | ICD-10-CM

## 2017-05-22 DIAGNOSIS — E1142 Type 2 diabetes mellitus with diabetic polyneuropathy: Secondary | ICD-10-CM

## 2017-05-22 DIAGNOSIS — I10 Essential (primary) hypertension: Secondary | ICD-10-CM

## 2017-05-22 DIAGNOSIS — G8929 Other chronic pain: Secondary | ICD-10-CM

## 2017-05-22 LAB — POCT GLYCOSYLATED HEMOGLOBIN (HGB A1C): Hemoglobin A1C: 5.9

## 2017-05-22 MED ORDER — METAXALONE 800 MG PO TABS: 800.0000 mg | ORAL_TABLET | Freq: Three times a day (TID) | ORAL | 0 refills | Status: DC | PRN

## 2017-05-22 NOTE — Progress Notes (Signed)
Subjective:    Patient ID: Amber Faulkner, female    DOB: 26-Jan-1961, 56 y.o.   MRN: [id number]  HPI 56 year old female comes in today for follow-up for recent hospitalization.  She was admitted for bacteremia and pneumonia on September 20.  Since then she actually had a cervical vertebral fusion on October 18.  Unfortunately she is still having a lot of pain in her neck and down into her shoulders in particular.  Complains about food getting stuck in her throat.  She feels like this started after she had her cervical spine surgery.  She says initially she was very swollen over her neck for weeks.  The swelling overall is better but she still feels like food is getting stuck.  She is been alternating heat and ice for the swelling and she was actually admitted from a 6 days.  As soon as she went home she had been home for less than 15 hours before she ended up calling the ambulance because she felt like she could not breathe and she was choking.  She had spinal surgery from C2-C7.  She does feel like the swelling causes a little bit of shortness of breath.  Diabetes - no hypoglycemic events. No wounds or sores that are not healing well. No increased thirst or urination. Checking glucose at home. Taking medications as prescribed without any side effects.  Is taking her Levemir 21 units twice a day.  She was not sure exactly how to do mealtime insulin but has been giving herself short acting insulin if her glucose is greater than 200 but she is not necessarily giving it at mealtime.  Hypertension- Pt denies chest pain, SOB, dizziness, or heart palpitations.  Taking meds as directed w/o problems.  Denies medication side effects.      Review of Systems  BP (!) 152/92   Pulse 85   Temp 98.2 F (36.8 C) (Oral)   Wt 196 lb (88.9 kg)   BMI 32.66 kg/m     Allergies  Allergen Reactions  . Cephalosporins Anaphylaxis  . Vancomycin Anaphylaxis  . Penicillins Hives and Rash  . Lisinopril Other (See  Comments)    Nausea,dizziness,headache    Past Medical History:  Diagnosis Date  . ADD (attention deficit disorder)   . Anxiety   . Chronic pain   . Diabetes mellitus   . HIV infection ([place])   . Hyperlipidemia   . Neuromuscular disorder [facility])     Past Surgical History:  Procedure Laterality Date  . ABDOMINAL HYSTERECTOMY  1991   complete  . CARPAL TUNNEL RELEASE  1993-1994   both wrist  . CESAREAN SECTION  1980  . HERNIA REPAIR  1976  . TARSAL TUNNEL RELEASE  1993   left ankle  . TUMOR REMOVAL  1993   left thigh    Social History   Socioeconomic History  . Marital status: Single    Spouse name: Not on file  . Number of children: Not on file  . Years of education: Not on file  . Highest education level: Not on file  Social Needs  . Financial resource strain: Not on file  . Food insecurity - worry: Not on file  . Food insecurity - inability: Not on file  . Transportation needs - medical: Not on file  . Transportation needs - non-medical: Not on file  Occupational History  . Not on file  Tobacco Use  . Smoking status: Current Every Day Smoker    Packs/day: 1.50  Types: Cigarettes  . Smokeless tobacco: Never Used  Substance and Sexual Activity  . Alcohol use: No  . Drug use: No  . Sexual activity: Not on file  Other Topics Concern  . Not on file  Social History Narrative  . Not on file    Family History  Problem Relation Age of Onset  . Cancer Mother        brain and lung  . COPD Mother   . Hyperlipidemia Father   . Hypertension Father   . Diabetes Father   . Heart disease Father   . Cancer Sister        hepatic and pancreatic  . Cancer Maternal Aunt        breast    Outpatient Encounter Medications as of 05/22/2017  Medication Sig  . ADVAIR DISKUS 250-50 MCG/DOSE AEPB Inhale 1 puff into the lungs 2 times daily.  Marland Kitchen albuterol (PROAIR HFA) 108 (90 Base) MCG/ACT inhaler Inhale 2 puffs into the lungs every 6 (six) hours as needed for wheezing or  shortness of breath.  . cyclobenzaprine (FLEXERIL) 10 MG tablet Take 1 tablet (10 mg total) by mouth at bedtime as needed for muscle spasms.  Marland Kitchen EASY TOUCH PEN NEEDLES 32G X 6 MM MISC USE AS DIRECTED  . gabapentin (NEURONTIN) 300 MG capsule Take 1 capsule (300 mg total) by mouth 4 (four) times daily.  . insulin aspart (NOVOLOG FLEXPEN) 100 UNIT/ML FlexPen Inject 15 Units into the skin 3 (three) times daily before meals - Use sliding scale  . Insulin Detemir (LEVEMIR FLEXTOUCH) 100 UNIT/ML Pen Inject 14-20 Units into the skin at bedtime.  . insulin lispro (HUMALOG KWIKPEN) 100 UNIT/ML KiwkPen Inject 0.15 mLs (15 Units total) into the skin 3 (three) times daily.  Marland Kitchen lacosamide (VIMPAT) 50 MG TABS tablet Take 1 tablet by mouth 2 (two) times daily.  Marland Kitchen losartan (COZAAR) 25 MG tablet Take 1 tablet (25 mg total) by mouth daily.  Marland Kitchen omeprazole (PRILOSEC) 40 MG capsule Take 1 capsule (40 mg total) by mouth daily.  . ondansetron (ZOFRAN-ODT) 8 MG disintegrating tablet Take 1 tablet (8 mg total) by mouth every 8 (eight) hours as needed for nausea or vomiting.  Marland Kitchen oxyCODONE (ROXICODONE) 15 MG immediate release tablet Take 15 mg by mouth.  . QUEtiapine (SEROQUEL) 200 MG tablet Take 1 tablet (200 mg total) by mouth at bedtime. Take 2 tablets at bedtime.  . rosuvastatin (CRESTOR) 20 MG tablet Take 1 tablet (20 mg total) by mouth daily.  Marland Kitchen SPIRIVA HANDIHALER 18 MCG inhalation capsule Place 1 capsule into inhaler and inhale daily.  . sucralfate (CARAFATE) 1 g tablet Take 1 tablet (1 g total) by mouth 4 (four) times daily -  with meals and at bedtime.  Marland Kitchen venlafaxine XR (EFFEXOR-XR) 75 MG 24 hr capsule Take 1 capsule (75 mg total) by mouth daily with breakfast.  . metaxalone (SKELAXIN) 800 MG tablet Take 1 tablet (800 mg total) 3 (three) times daily as needed by mouth for muscle spasms.   No facility-administered encounter medications on file as of 05/22/2017.           Objective:   Physical Exam   Constitutional: She is oriented to person, place, and time. She appears well-developed and well-nourished.  HENT:  Head: Normocephalic and atraumatic.  Cardiovascular: Normal rate, regular rhythm and normal heart sounds.  Pulmonary/Chest: Effort normal and breath sounds normal.  Neurological: She is alert and oriented to person, place, and time.  Skin: Skin is warm  and dry.  Psychiatric: She has a normal mood and affect. Her behavior is normal.          Assessment & Plan:  Diabetes-sliding scale given for mealtime insulin.  She is using her Levemir consistently at 21 units twice a day. Lab Results  Component Value Date   HGBA1C 5.9 05/22/2017   HTN - Uncontrolled.   due for CMP and CBC.    Pharyngeal dysphagia-refer to gastroenterology y for further evaluation.  She would likely benefit from a swallow study and possibly endoscopy as well.  Chronic pain in both shoulders -pain is chronic.  It actually started before she had she had her cervical spine surgery and she was hoping that that would actually improve.  She is on chronic pain medications through pain management.  She felt like the Flexeril was not helpful and would like to try something different.  Sent in Rx for Skelaxin.  I explained to her that if she wants a prescription for Soma she needs to speak with her pain management doctor.

## 2017-05-23 ENCOUNTER — Encounter: Payer: Self-pay | Admitting: Family Medicine

## 2017-05-23 LAB — CBC WITH DIFFERENTIAL/PLATELET
BASOS PCT: 2.1 %
Basophils Absolute: 204 cells/uL — ABNORMAL HIGH (ref 0–200)
EOS ABS: 281 {cells}/uL (ref 15–500)
Eosinophils Relative: 2.9 %
HEMATOCRIT: 36.5 % (ref 35.0–45.0)
HEMOGLOBIN: 11.5 g/dL — AB (ref 11.7–15.5)
LYMPHS ABS: 3444 {cells}/uL (ref 850–3900)
MCH: 26.1 pg — ABNORMAL LOW (ref 27.0–33.0)
MCHC: 31.5 g/dL — ABNORMAL LOW (ref 32.0–36.0)
MCV: 82.8 fL (ref 80.0–100.0)
Monocytes Relative: 12.1 %
Neutro Abs: 4598 cells/uL (ref 1500–7800)
Neutrophils Relative %: 47.4 %
Platelets: 66 10*3/uL — ABNORMAL LOW (ref 140–400)
RBC: 4.41 10*6/uL (ref 3.80–5.10)
RDW: 17.1 % — ABNORMAL HIGH (ref 11.0–15.0)
TOTAL LYMPHOCYTE: 35.5 %
WBC: 9.7 10*3/uL (ref 3.8–10.8)
WBCMIX: 1174 {cells}/uL — AB (ref 200–950)

## 2017-05-23 LAB — COMPLETE METABOLIC PANEL WITH GFR
AG RATIO: 1.8 (calc) (ref 1.0–2.5)
ALKALINE PHOSPHATASE (APISO): 112 U/L (ref 33–130)
ALT: 7 U/L (ref 6–29)
AST: 13 U/L (ref 10–35)
Albumin: 4 g/dL (ref 3.6–5.1)
BILIRUBIN TOTAL: 0.2 mg/dL (ref 0.2–1.2)
BUN: 14 mg/dL (ref 7–25)
CHLORIDE: 107 mmol/L (ref 98–110)
CO2: 26 mmol/L (ref 20–32)
Calcium: 8.7 mg/dL (ref 8.6–10.4)
Creat: 0.52 mg/dL (ref 0.50–1.05)
GFR, EST AFRICAN AMERICAN: 126 mL/min/{1.73_m2} (ref >=60)
GFR, Est Non African American: 108 mL/min/{1.73_m2} (ref >=60)
GLUCOSE: 120 mg/dL — AB (ref 65–99)
Globulin: 2.2 g/dL (calc) (ref 1.9–3.7)
POTASSIUM: 3.9 mmol/L (ref 3.5–5.3)
Sodium: 142 mmol/L (ref 135–146)
Total Protein: 6.2 g/dL (ref 6.1–8.1)

## 2017-06-07 DIAGNOSIS — Z79891 Long term (current) use of opiate analgesic: Secondary | ICD-10-CM | POA: Diagnosis not present

## 2017-06-07 DIAGNOSIS — M4726 Other spondylosis with radiculopathy, lumbar region: Secondary | ICD-10-CM | POA: Diagnosis not present

## 2017-06-07 DIAGNOSIS — M5136 Other intervertebral disc degeneration, lumbar region: Secondary | ICD-10-CM | POA: Diagnosis not present

## 2017-06-07 DIAGNOSIS — M4722 Other spondylosis with radiculopathy, cervical region: Secondary | ICD-10-CM | POA: Diagnosis not present

## 2017-06-07 DIAGNOSIS — M5030 Other cervical disc degeneration, unspecified cervical region: Secondary | ICD-10-CM | POA: Diagnosis not present

## 2017-06-10 ENCOUNTER — Other Ambulatory Visit: Payer: Self-pay | Admitting: Family Medicine

## 2017-06-22 ENCOUNTER — Other Ambulatory Visit: Payer: Self-pay | Admitting: Family Medicine

## 2017-06-23 ENCOUNTER — Other Ambulatory Visit: Payer: Self-pay | Admitting: Family Medicine

## 2017-06-23 NOTE — Telephone Encounter (Signed)
Please advise on medication refill...thanks  

## 2017-08-03 DIAGNOSIS — M5030 Other cervical disc degeneration, unspecified cervical region: Secondary | ICD-10-CM | POA: Diagnosis not present

## 2017-08-03 DIAGNOSIS — M791 Myalgia, unspecified site: Secondary | ICD-10-CM | POA: Diagnosis not present

## 2017-08-03 DIAGNOSIS — M4722 Other spondylosis with radiculopathy, cervical region: Secondary | ICD-10-CM | POA: Diagnosis not present

## 2017-08-03 DIAGNOSIS — M5136 Other intervertebral disc degeneration, lumbar region: Secondary | ICD-10-CM | POA: Diagnosis not present

## 2017-08-03 DIAGNOSIS — M4726 Other spondylosis with radiculopathy, lumbar region: Secondary | ICD-10-CM | POA: Diagnosis not present

## 2017-08-03 DIAGNOSIS — Z79891 Long term (current) use of opiate analgesic: Secondary | ICD-10-CM | POA: Diagnosis not present

## 2017-09-19 ENCOUNTER — Other Ambulatory Visit: Payer: Self-pay | Admitting: Family Medicine

## 2017-10-02 DIAGNOSIS — M4722 Other spondylosis with radiculopathy, cervical region: Secondary | ICD-10-CM | POA: Diagnosis not present

## 2017-10-02 DIAGNOSIS — M5136 Other intervertebral disc degeneration, lumbar region: Secondary | ICD-10-CM | POA: Diagnosis not present

## 2017-10-02 DIAGNOSIS — Z79891 Long term (current) use of opiate analgesic: Secondary | ICD-10-CM | POA: Diagnosis not present

## 2017-10-02 DIAGNOSIS — M5030 Other cervical disc degeneration, unspecified cervical region: Secondary | ICD-10-CM | POA: Diagnosis not present

## 2017-10-02 DIAGNOSIS — M4726 Other spondylosis with radiculopathy, lumbar region: Secondary | ICD-10-CM | POA: Diagnosis not present

## 2017-10-09 ENCOUNTER — Ambulatory Visit: Payer: Medicare Other | Admitting: Family Medicine

## 2017-10-09 NOTE — Progress Notes (Deleted)
Subjective:    CC: DM, HTN  HPI:  Diabetes - no hypoglycemic events. No wounds or sores that are not healing well. No increased thirst or urination. Checking glucose at home. Taking medications as prescribed without any side effects.  Hypertension- Pt denies chest pain, SOB, dizziness, or heart palpitations.  Taking meds as directed w/o problems.  Denies medication side effects.      Past medical history, Surgical history, Family history not pertinant except as noted below, Social history, Allergies, and medications have been entered into the medical record, reviewed, and corrections made.   Review of Systems: No fevers, chills, night sweats, weight loss, chest pain, or shortness of breath.   Objective:    General: Well Developed, well nourished, and in no acute distress.  Neuro: Alert and oriented x3, extra-ocular muscles intact, sensation grossly intact.  HEENT: Normocephalic, atraumatic  Skin: Warm and dry, no rashes. Cardiac: Regular rate and rhythm, no murmurs rubs or gallops, no lower extremity edema.  Respiratory: Clear to auscultation bilaterally. Not using accessory muscles, speaking in full sentences.   Impression and Recommendations:    DM-   HTN  -  

## 2017-10-11 ENCOUNTER — Other Ambulatory Visit: Payer: Self-pay | Admitting: Family Medicine

## 2017-10-12 DIAGNOSIS — Z87891 Personal history of nicotine dependence: Secondary | ICD-10-CM | POA: Diagnosis not present

## 2017-10-12 DIAGNOSIS — J383 Other diseases of vocal cords: Secondary | ICD-10-CM | POA: Diagnosis not present

## 2017-11-23 DIAGNOSIS — G35 Multiple sclerosis: Secondary | ICD-10-CM | POA: Diagnosis not present

## 2017-11-23 DIAGNOSIS — M545 Low back pain: Secondary | ICD-10-CM | POA: Diagnosis not present

## 2017-11-23 DIAGNOSIS — D649 Anemia, unspecified: Secondary | ICD-10-CM | POA: Diagnosis not present

## 2017-11-23 DIAGNOSIS — M5481 Occipital neuralgia: Secondary | ICD-10-CM | POA: Diagnosis not present

## 2017-11-23 DIAGNOSIS — E1142 Type 2 diabetes mellitus with diabetic polyneuropathy: Secondary | ICD-10-CM | POA: Diagnosis not present

## 2017-11-29 DIAGNOSIS — M5030 Other cervical disc degeneration, unspecified cervical region: Secondary | ICD-10-CM | POA: Diagnosis not present

## 2017-11-29 DIAGNOSIS — M4726 Other spondylosis with radiculopathy, lumbar region: Secondary | ICD-10-CM | POA: Diagnosis not present

## 2017-11-29 DIAGNOSIS — Z79891 Long term (current) use of opiate analgesic: Secondary | ICD-10-CM | POA: Diagnosis not present

## 2017-11-29 DIAGNOSIS — M4722 Other spondylosis with radiculopathy, cervical region: Secondary | ICD-10-CM | POA: Diagnosis not present

## 2017-11-29 DIAGNOSIS — M5136 Other intervertebral disc degeneration, lumbar region: Secondary | ICD-10-CM | POA: Diagnosis not present

## 2018-01-13 ENCOUNTER — Other Ambulatory Visit: Payer: Self-pay | Admitting: Family Medicine

## 2018-01-18 ENCOUNTER — Other Ambulatory Visit: Payer: Self-pay | Admitting: Family Medicine

## 2018-01-22 ENCOUNTER — Other Ambulatory Visit: Payer: Self-pay | Admitting: Family Medicine

## 2018-01-25 ENCOUNTER — Other Ambulatory Visit: Payer: Self-pay

## 2018-01-25 NOTE — Telephone Encounter (Signed)
Amber Faulkner has an appointment scheduled for the 26 th of this month. She would like a refill on gabapentin.

## 2018-01-26 NOTE — Telephone Encounter (Signed)
Patient advised.

## 2018-01-29 DIAGNOSIS — M4722 Other spondylosis with radiculopathy, cervical region: Secondary | ICD-10-CM | POA: Diagnosis not present

## 2018-01-29 DIAGNOSIS — M5136 Other intervertebral disc degeneration, lumbar region: Secondary | ICD-10-CM | POA: Diagnosis not present

## 2018-01-29 DIAGNOSIS — M5030 Other cervical disc degeneration, unspecified cervical region: Secondary | ICD-10-CM | POA: Diagnosis not present

## 2018-01-29 DIAGNOSIS — M4726 Other spondylosis with radiculopathy, lumbar region: Secondary | ICD-10-CM | POA: Diagnosis not present

## 2018-01-29 DIAGNOSIS — Z79891 Long term (current) use of opiate analgesic: Secondary | ICD-10-CM | POA: Diagnosis not present

## 2018-02-01 DIAGNOSIS — E1142 Type 2 diabetes mellitus with diabetic polyneuropathy: Secondary | ICD-10-CM | POA: Diagnosis not present

## 2018-02-01 DIAGNOSIS — G43009 Migraine without aura, not intractable, without status migrainosus: Secondary | ICD-10-CM | POA: Diagnosis not present

## 2018-02-01 DIAGNOSIS — M545 Low back pain: Secondary | ICD-10-CM | POA: Diagnosis not present

## 2018-02-01 DIAGNOSIS — G35 Multiple sclerosis: Secondary | ICD-10-CM | POA: Diagnosis not present

## 2018-02-02 ENCOUNTER — Ambulatory Visit (INDEPENDENT_AMBULATORY_CARE_PROVIDER_SITE_OTHER): Payer: Medicare Other | Admitting: Family Medicine

## 2018-02-02 ENCOUNTER — Encounter: Payer: Self-pay | Admitting: Family Medicine

## 2018-02-02 VITALS — BP 128/53 | HR 50 | Ht 65.0 in | Wt 198.0 lb

## 2018-02-02 DIAGNOSIS — I10 Essential (primary) hypertension: Secondary | ICD-10-CM | POA: Diagnosis not present

## 2018-02-02 DIAGNOSIS — R0683 Snoring: Secondary | ICD-10-CM

## 2018-02-02 DIAGNOSIS — Z794 Long term (current) use of insulin: Secondary | ICD-10-CM

## 2018-02-02 DIAGNOSIS — E1165 Type 2 diabetes mellitus with hyperglycemia: Secondary | ICD-10-CM

## 2018-02-02 DIAGNOSIS — R809 Proteinuria, unspecified: Secondary | ICD-10-CM

## 2018-02-02 DIAGNOSIS — G43909 Migraine, unspecified, not intractable, without status migrainosus: Secondary | ICD-10-CM

## 2018-02-02 DIAGNOSIS — E559 Vitamin D deficiency, unspecified: Secondary | ICD-10-CM

## 2018-02-02 DIAGNOSIS — E1129 Type 2 diabetes mellitus with other diabetic kidney complication: Secondary | ICD-10-CM

## 2018-02-02 DIAGNOSIS — J449 Chronic obstructive pulmonary disease, unspecified: Secondary | ICD-10-CM | POA: Diagnosis not present

## 2018-02-02 DIAGNOSIS — E1142 Type 2 diabetes mellitus with diabetic polyneuropathy: Secondary | ICD-10-CM | POA: Diagnosis not present

## 2018-02-02 DIAGNOSIS — F5101 Primary insomnia: Secondary | ICD-10-CM

## 2018-02-02 DIAGNOSIS — Z1231 Encounter for screening mammogram for malignant neoplasm of breast: Secondary | ICD-10-CM

## 2018-02-02 DIAGNOSIS — IMO0002 Reserved for concepts with insufficient information to code with codable children: Secondary | ICD-10-CM

## 2018-02-02 DIAGNOSIS — R3915 Urgency of urination: Secondary | ICD-10-CM

## 2018-02-02 LAB — POCT URINALYSIS DIPSTICK
Bilirubin, UA: NEGATIVE
Glucose, UA: NEGATIVE
Ketones, UA: NEGATIVE
Leukocytes, UA: NEGATIVE
NITRITE UA: NEGATIVE
PH UA: 5.5 (ref 5.0–8.0)
Protein, UA: NEGATIVE
RBC UA: NEGATIVE
Spec Grav, UA: >=1.03 — AB (ref 1.010–1.025)
UROBILINOGEN UA: 0.2 U/dL

## 2018-02-02 LAB — POCT GLYCOSYLATED HEMOGLOBIN (HGB A1C): HEMOGLOBIN A1C: 6.6 % — AB (ref 4.0–5.6)

## 2018-02-02 LAB — POCT UA - MICROALBUMIN
CREATININE, POC: 300 mg/dL
Microalbumin Ur, POC: 30 mg/L

## 2018-02-02 NOTE — Progress Notes (Signed)
Subjective:    CC:   HPI:  57 year old female comes in today for routine office visit.  She has not been seen in our office in over a year and since then she has had back surgery as well as a splenectomy.  Diabetes -she has not been seen in over a year for diabetes.  In fact last fall she was admitted to the hospital for pneumonia.  No recent hypoglycemic events. No wounds or sores that are not healing well. No increased thirst or urination. Checking glucose at home. Taking medications as prescribed without any side effects. Pt says her sugars often run in the 180s-320s. She says she rarely has hypoglycemic events. Pt states she feels the best when it is in the 300s. She says she checks her sugar 4 times a day. Pt states she has been avoiding sugar and carbs from her diet.  Hypertension- Pt denies chest pain, SOB, dizziness, or heart palpitations. Taking meds as directed w/o problems. Denies medication side effects.    COPD - She is only complaining of chronic cough. She is taking OTC Claritin which is helping some. She says her cough is worse when it is hot outside. She says the allergies have made it hard so she has avoided going outside. Occassional SOB. Pt states she currently doesn't have a pulmonologist she sees. No worsening SOB, cp or any other symptoms.   Urinary urgency - She says she has been having urinary urgency for a few weeks. No dysuria, hematuria noted. No abdominal, back pain, fever or chills.   MS - Pt says she sees Dr. Lawrence Marseilles at [facility] for this. She reports a new symptoms of vision changes in her left eye. She has been seeing flashes of white then pink images. No eye trauma, drainage or eye pain. Pt will be seeing an opthamologist for this. Dr. Berdine Addison aware of symptoms.  She is also noted a couple of new symptoms including some worsening of gait instability.  She feels like her cognitive functions have declined slightly.  She is getting sciatica more  frequently with a burning between her shoulder blades as well.  She feels like the neuropathy in her arms is also getting worse.  She says it almost feels like her arms are sitting on blocks of ice.  Insomnia with snoring- Pt has a hx of insomnia. She says she has not been sleeping well. She states she is fatigued during the day and in more pain due to poor sleep.  Pt had a sleep study several years ago. She is requesting to get another sleep study. No hx of OSA. She does report occassional snoring.  He also complains of a couple of lumps on her arms.  She says sometimes they actually get painful and sore to touch.  She wanted to know what they could be.  She has had a fibrous cyst removed from her left leg before.  Similar lesion she is actually had for a long time.  Past medical history, Surgical history, Family history not pertinant except as noted below, Social history, Allergies, and medications have been entered into the medical record, reviewed, and corrections made.   Review of Systems: No fevers, chills, night sweats, weight loss, chest pain, or shortness of breath.   Objective:    General: Well Developed, well nourished, and in no acute distress.  Neuro: Alert and oriented x3, extra-ocular muscles intact, sensation grossly intact.  HEENT: Normocephalic, atraumatic  Skin: Warm and dry, no rashes.  Cardiac: Regular rate and rhythm, no murmurs rubs or gallops, no lower extremity edema.  Respiratory: Clear to auscultation bilaterally. Not using accessory muscles, speaking in full sentences.   Impression and Recommendations:   DM and proteinura - Pt reports high BGs readings at home but A1c is 6.6 which is great. Will follow for now to avoid lows. Taking Levemir and Novolg on sliding scale. Ordered urine albumin today.  Microalbumin negative.  COPD - Pt told to continue Advair, Spiriva. Refills done.  Encouraged her to check with the pharmacy to see if they would cover Trelegy which would  simplify her regimen.  HTN - BP is great. Pt stable on losartan. BP was 128/53.  Urinary urgency- Ordered Urine culture. Urine dipstick was benign. Will follow up with results.   Insomnia with snoring- Will refer to sleep study. Pt wants it in [place] or near that area.  She clearly has some significant sleep disturbance as well as snoring.  As of the initial sleep study is negative may consider referral to a sleep specialist.  MS - Pt encouraged to follow up with an eye doctor. Dr. Berdine Addison aware of new symptoms and following.   Visual disturbance-she does have an upcoming appointment with ophthalmology.  Lap slip was given for basic labs. Will follow up with results.  Minded to schedule her mammogram.  I tried to get her to go today but she declined.  She says she did get several vaccines right before her splenectomy so we will try to get those from [facility] to get those updated in her chart.   Time spent 45 minutes, greater than 50% spent face-to-face in regards to her diabetes, COPD, hypertension, urinary urgency, insomnia and sleep disorder, MS, visual disturbances.

## 2018-02-02 NOTE — Patient Instructions (Signed)
Can ask the pharmacist if Trelegy is covered under your insurance plan.  If it is that would take place of your Advair and Spiriva.

## 2018-02-05 ENCOUNTER — Telehealth: Payer: Self-pay

## 2018-02-05 DIAGNOSIS — R3915 Urgency of urination: Secondary | ICD-10-CM | POA: Diagnosis not present

## 2018-02-05 MED ORDER — GABAPENTIN 300 MG PO CAPS
300.0000 mg | ORAL_CAPSULE | Freq: Four times a day (QID) | ORAL | 0 refills | Status: DC
Start: 2018-02-05 — End: 2018-03-13

## 2018-02-05 MED ORDER — ALBUTEROL SULFATE HFA 108 (90 BASE) MCG/ACT IN AERS: 2.0000 | INHALATION_SPRAY | Freq: Four times a day (QID) | RESPIRATORY_TRACT | 0 refills | Status: DC | PRN

## 2018-02-05 MED ORDER — SUCRALFATE 1 G PO TABS: 1.0000 g | ORAL_TABLET | Freq: Three times a day (TID) | ORAL | 0 refills | Status: DC

## 2018-02-05 MED ORDER — OMEPRAZOLE 40 MG PO CPDR: 40.0000 mg | DELAYED_RELEASE_CAPSULE | Freq: Every day | ORAL | 0 refills | Status: DC

## 2018-02-05 MED ORDER — INSULIN DETEMIR 100 UNIT/ML FLEXPEN: 14.0000 [IU] | PEN_INJECTOR | Freq: Every day | SUBCUTANEOUS | 0 refills | Status: DC

## 2018-02-05 MED ORDER — INSULIN ASPART 100 UNIT/ML FLEXPEN: PEN_INJECTOR | SUBCUTANEOUS | 0 refills | Status: DC

## 2018-02-05 MED ORDER — ONDANSETRON 8 MG PO TBDP: 8.0000 mg | ORAL_TABLET | Freq: Three times a day (TID) | ORAL | 0 refills | Status: DC | PRN

## 2018-02-05 MED ORDER — METAXALONE 800 MG PO TABS: 800.0000 mg | ORAL_TABLET | Freq: Three times a day (TID) | ORAL | 0 refills | Status: DC | PRN

## 2018-02-05 MED ORDER — LOSARTAN POTASSIUM 25 MG PO TABS: 25.0000 mg | ORAL_TABLET | Freq: Every day | ORAL | 0 refills | Status: DC

## 2018-02-05 MED ORDER — CYCLOBENZAPRINE HCL 10 MG PO TABS: 10.0000 mg | ORAL_TABLET | Freq: Every evening | ORAL | 0 refills | Status: DC | PRN

## 2018-02-05 MED ORDER — VENLAFAXINE HCL ER 75 MG PO CP24: ORAL_CAPSULE | ORAL | 0 refills | Status: DC

## 2018-02-05 MED ORDER — ROSUVASTATIN CALCIUM 20 MG PO TABS: ORAL_TABLET | ORAL | 0 refills | Status: DC

## 2018-02-05 NOTE — Telephone Encounter (Signed)
Pt called back- states she does not need Duloxetine. No further needs at this time

## 2018-02-05 NOTE — Telephone Encounter (Signed)
Pt called- she is switching to [place] and is needing refills on: Albuterol, Flexeril, Cymbalta, Gabapentin, Novolog, Levemir, Cozar, Skelaxin, Prilosec, Zofran, Crestor, Carafate, and Venlafaxine.   Pt had OV on Friday- will send in 30 day RXs  All RX sent except Duloxetine, this was noted as a historical medication in the chart and I have asked pt to call me and let me know who she received this from in the past

## 2018-02-08 ENCOUNTER — Encounter: Payer: Self-pay | Admitting: Physician Assistant

## 2018-02-09 LAB — URINE CULTURE
MICRO NUMBER:: 90896676
SPECIMEN QUALITY:: ADEQUATE

## 2018-03-13 ENCOUNTER — Other Ambulatory Visit: Payer: Self-pay | Admitting: Family Medicine

## 2018-03-15 DIAGNOSIS — E1142 Type 2 diabetes mellitus with diabetic polyneuropathy: Secondary | ICD-10-CM | POA: Diagnosis not present

## 2018-03-15 DIAGNOSIS — D649 Anemia, unspecified: Secondary | ICD-10-CM | POA: Diagnosis not present

## 2018-03-15 DIAGNOSIS — G5623 Lesion of ulnar nerve, bilateral upper limbs: Secondary | ICD-10-CM | POA: Diagnosis not present

## 2018-03-15 DIAGNOSIS — M5481 Occipital neuralgia: Secondary | ICD-10-CM | POA: Diagnosis not present

## 2018-03-15 DIAGNOSIS — G35 Multiple sclerosis: Secondary | ICD-10-CM | POA: Diagnosis not present

## 2018-03-19 ENCOUNTER — Other Ambulatory Visit: Payer: Self-pay

## 2018-03-19 NOTE — Patient Outreach (Signed)
Amber Faulkner [facility]) Care Management  03/19/2018  Amber Faulkner 1961-04-14 [id number]   Medication Adherence call to [name] patient did not answer patient is due on Losartan 25 mg and Rosuvastatin 20 mg. [name] is showing past due under [place].   [facility] Direct Dial [phone]  Fax [phone] [email or web address]

## 2018-03-20 DIAGNOSIS — G35 Multiple sclerosis: Secondary | ICD-10-CM | POA: Diagnosis not present

## 2018-03-26 DIAGNOSIS — G35 Multiple sclerosis: Secondary | ICD-10-CM | POA: Diagnosis not present

## 2018-03-27 DIAGNOSIS — G35 Multiple sclerosis: Secondary | ICD-10-CM | POA: Diagnosis not present

## 2018-03-28 DIAGNOSIS — Z79891 Long term (current) use of opiate analgesic: Secondary | ICD-10-CM | POA: Diagnosis not present

## 2018-03-28 DIAGNOSIS — M4722 Other spondylosis with radiculopathy, cervical region: Secondary | ICD-10-CM | POA: Diagnosis not present

## 2018-03-28 DIAGNOSIS — M5030 Other cervical disc degeneration, unspecified cervical region: Secondary | ICD-10-CM | POA: Diagnosis not present

## 2018-03-28 DIAGNOSIS — M4726 Other spondylosis with radiculopathy, lumbar region: Secondary | ICD-10-CM | POA: Diagnosis not present

## 2018-03-28 DIAGNOSIS — M5136 Other intervertebral disc degeneration, lumbar region: Secondary | ICD-10-CM | POA: Diagnosis not present

## 2018-04-23 ENCOUNTER — Other Ambulatory Visit: Payer: Self-pay | Admitting: Family Medicine

## 2018-04-30 ENCOUNTER — Ambulatory Visit: Payer: Medicare Other | Admitting: Physician Assistant

## 2018-05-23 DIAGNOSIS — Z79891 Long term (current) use of opiate analgesic: Secondary | ICD-10-CM | POA: Diagnosis not present

## 2018-05-23 DIAGNOSIS — M4726 Other spondylosis with radiculopathy, lumbar region: Secondary | ICD-10-CM | POA: Diagnosis not present

## 2018-05-23 DIAGNOSIS — M5136 Other intervertebral disc degeneration, lumbar region: Secondary | ICD-10-CM | POA: Diagnosis not present

## 2018-05-23 DIAGNOSIS — M4722 Other spondylosis with radiculopathy, cervical region: Secondary | ICD-10-CM | POA: Diagnosis not present

## 2018-05-23 DIAGNOSIS — M5030 Other cervical disc degeneration, unspecified cervical region: Secondary | ICD-10-CM | POA: Diagnosis not present

## 2018-05-23 DIAGNOSIS — M791 Myalgia, unspecified site: Secondary | ICD-10-CM | POA: Diagnosis not present

## 2018-05-29 ENCOUNTER — Telehealth: Payer: Self-pay | Admitting: Family Medicine

## 2018-05-29 ENCOUNTER — Ambulatory Visit: Payer: Medicare Other | Admitting: Family Medicine

## 2018-05-29 ENCOUNTER — Encounter: Payer: Self-pay | Admitting: Family Medicine

## 2018-05-29 VITALS — BP 119/53 | HR 87 | Ht 64.17 in | Wt 198.0 lb

## 2018-05-29 DIAGNOSIS — R809 Proteinuria, unspecified: Secondary | ICD-10-CM

## 2018-05-29 DIAGNOSIS — J449 Chronic obstructive pulmonary disease, unspecified: Secondary | ICD-10-CM

## 2018-05-29 DIAGNOSIS — J019 Acute sinusitis, unspecified: Secondary | ICD-10-CM

## 2018-05-29 DIAGNOSIS — E1142 Type 2 diabetes mellitus with diabetic polyneuropathy: Secondary | ICD-10-CM

## 2018-05-29 DIAGNOSIS — I10 Essential (primary) hypertension: Secondary | ICD-10-CM

## 2018-05-29 DIAGNOSIS — E1129 Type 2 diabetes mellitus with other diabetic kidney complication: Secondary | ICD-10-CM

## 2018-05-29 DIAGNOSIS — IMO0002 Reserved for concepts with insufficient information to code with codable children: Secondary | ICD-10-CM

## 2018-05-29 DIAGNOSIS — Z794 Long term (current) use of insulin: Secondary | ICD-10-CM

## 2018-05-29 DIAGNOSIS — Z1231 Encounter for screening mammogram for malignant neoplasm of breast: Secondary | ICD-10-CM

## 2018-05-29 DIAGNOSIS — E1165 Type 2 diabetes mellitus with hyperglycemia: Secondary | ICD-10-CM

## 2018-05-29 LAB — POCT UA - MICROALBUMIN
Creatinine, POC: 50 mg/dL
Microalbumin Ur, POC: 30 mg/L

## 2018-05-29 LAB — POCT GLYCOSYLATED HEMOGLOBIN (HGB A1C): HEMOGLOBIN A1C: 6.5 % — AB (ref 4.0–5.6)

## 2018-05-29 MED ORDER — QUETIAPINE FUMARATE 200 MG PO TABS: 200.0000 mg | ORAL_TABLET | Freq: Every day | ORAL | 1 refills | Status: DC

## 2018-05-29 MED ORDER — DOXYCYCLINE HYCLATE 100 MG PO TABS: 100.0000 mg | ORAL_TABLET | Freq: Two times a day (BID) | ORAL | 0 refills | Status: DC

## 2018-05-29 MED ORDER — INSULIN LISPRO (1 UNIT DIAL) 100 UNIT/ML (KWIKPEN): 15.0000 [IU] | PEN_INJECTOR | Freq: Three times a day (TID) | SUBCUTANEOUS | 3 refills | Status: DC

## 2018-05-29 MED ORDER — METHYLPREDNISOLONE ACETATE 40 MG/ML IJ SUSP
40.0000 mg | Freq: Once | INTRAMUSCULAR | Status: AC
  Administered 2018-05-29: 40 mg via INTRAMUSCULAR

## 2018-05-29 MED ORDER — SPIRIVA HANDIHALER 18 MCG IN CAPS: ORAL_CAPSULE | RESPIRATORY_TRACT | 3 refills | Status: AC

## 2018-05-29 MED ORDER — ADVAIR DISKUS 250-50 MCG/DOSE IN AEPB: INHALATION_SPRAY | RESPIRATORY_TRACT | 3 refills | Status: DC

## 2018-05-29 MED ORDER — NYSTATIN 100000 UNIT/ML MT SUSP: 5.0000 mL | Freq: Four times a day (QID) | OROMUCOSAL | 0 refills | Status: DC

## 2018-05-29 MED ORDER — SUCRALFATE 1 G PO TABS: 1.0000 g | ORAL_TABLET | Freq: Three times a day (TID) | ORAL | 0 refills | Status: DC

## 2018-05-29 NOTE — Telephone Encounter (Signed)
Please call [facility] and see if she has filled a script for tizanidine and/or Soma recenty. POt was asking for muscle relaxers but it looks like on our end she may have filled one recentlhy.

## 2018-05-29 NOTE — Progress Notes (Signed)
Subjective:    CC: BP and DM  HPI:  Hypertension- Pt denies chest pain, SOB, dizziness, or heart palpitations.  Taking meds as directed w/o problems.  Denies medication side effects.    Diabetes - no hypoglycemic events. No wounds or sores that are not healing well. No increased thirst or urination. Checking glucose at home. Taking medications as prescribed without any side effects.  F/U. COPD - Stable. Still smoking heaviy. Based on refills she should be out of spiriva and symbicort.     MS - she is currently on Vimpat for her MS.  She is currently on gabapentin and would like to try other medications.  She feels like the Lyrica works better but it is too expensive.  She would like to use one muscle relaxer instead of flexeril and skelaxin.  On her med list it looks like she was recently given tizanidine and soma.    Is also been sick for about 3 weeks with cough, nasal congestion, sore throat, intermittent ear pain.  She feels like there is mucus just stuck in the back of her throat that she cannot move out.  She is had more frequent headaches.  She says she is tried several over-the-counter cough syrups and nothing seems to be helping.  Is been using her albuterol.  Past medical history, Surgical history, Family history not pertinant except as noted below, Social history, Allergies, and medications have been entered into the medical record, reviewed, and corrections made.   Review of Systems: No fevers, chills, night sweats, weight loss, chest pain, or shortness of breath.   Objective:    General: Well Developed, well nourished, and in no acute distress.  Neuro: Alert and oriented x3, extra-ocular muscles intact, sensation grossly intact.  HEENT: Normocephalic, atraumatic  Skin: Warm and dry, no rashes. Cardiac: Regular rate and rhythm, no murmurs rubs or gallops, no lower extremity edema.  Respiratory: Clear to auscultation bilaterally. Not using accessory muscles, speaking in full  sentences.   Impression and Recommendations:    HTN - Well controlled. Continue current regimen. Follow up in  3-4 months.    DM - Well controlled. Continue current regimen. Follow up in  4 months.    COPD exacerbation-doxycycline Depo-Medrol.  Call if not better in 1 week.  MS - will need to verify what other muscle relaxers she has had recently form pharmacy as it looks like she has had scripts for tizanidine and soma filled recently.    Mammo order placed.  Acute sinusitis -with doxycycline and Depo-Medrol.

## 2018-05-30 NOTE — Telephone Encounter (Signed)
Looked pt up in [employer] and Dr. Berdine Addison prescribes Soma 350 mg for her. This was last filled on 03/15/18 #84 for 28 days. Other muscle relaxer's d/c'd and soma added to her med list. Spoke w/pcp about this. Maryruth Eve, [name], CMA

## 2018-05-30 NOTE — Telephone Encounter (Signed)
Per Cyril Mourning at [facility]: pt has never filled Tizanidine at their pharmacy (she switched to them when [facility] closed) and the last fill for Soma was 03/15/18 for a #28 day supply

## 2018-05-31 ENCOUNTER — Telehealth: Payer: Self-pay

## 2018-05-31 NOTE — Telephone Encounter (Signed)
Alex[facility] called and left a message stating the pharmacy did not receive the muscle relaxer. Please advise.

## 2018-06-01 NOTE — Telephone Encounter (Signed)
It looks like she already get Soma from 1 of her other providers.  There is no muscle relaxer that is going to work better than that.  I would encourage her to stick with that.  And if she still struggling to speak with her neurologist.  They could even try baclofen if she has not done so before.  That helps more with spasticity muscles.

## 2018-06-01 NOTE — Telephone Encounter (Signed)
Left message advising of recommendations.  

## 2018-06-06 ENCOUNTER — Other Ambulatory Visit: Payer: Medicare Other

## 2018-06-18 ENCOUNTER — Other Ambulatory Visit: Payer: Self-pay | Admitting: *Deleted

## 2018-06-18 ENCOUNTER — Other Ambulatory Visit: Payer: Self-pay | Admitting: Family Medicine

## 2018-06-21 DIAGNOSIS — Z87891 Personal history of nicotine dependence: Secondary | ICD-10-CM | POA: Diagnosis not present

## 2018-06-21 DIAGNOSIS — Z9889 Other specified postprocedural states: Secondary | ICD-10-CM | POA: Diagnosis not present

## 2018-06-21 DIAGNOSIS — G43009 Migraine without aura, not intractable, without status migrainosus: Secondary | ICD-10-CM | POA: Diagnosis not present

## 2018-06-21 DIAGNOSIS — G35 Multiple sclerosis: Secondary | ICD-10-CM | POA: Diagnosis not present

## 2018-06-21 DIAGNOSIS — E1142 Type 2 diabetes mellitus with diabetic polyneuropathy: Secondary | ICD-10-CM | POA: Diagnosis not present

## 2018-06-21 DIAGNOSIS — J383 Other diseases of vocal cords: Secondary | ICD-10-CM | POA: Diagnosis not present

## 2018-06-21 DIAGNOSIS — M5481 Occipital neuralgia: Secondary | ICD-10-CM | POA: Diagnosis not present

## 2018-06-21 DIAGNOSIS — M545 Low back pain: Secondary | ICD-10-CM | POA: Diagnosis not present

## 2018-07-05 DIAGNOSIS — R0683 Snoring: Secondary | ICD-10-CM | POA: Diagnosis not present

## 2018-07-12 DIAGNOSIS — G4710 Hypersomnia, unspecified: Secondary | ICD-10-CM | POA: Diagnosis not present

## 2018-07-16 DIAGNOSIS — M4726 Other spondylosis with radiculopathy, lumbar region: Secondary | ICD-10-CM | POA: Diagnosis not present

## 2018-07-16 DIAGNOSIS — Z79891 Long term (current) use of opiate analgesic: Secondary | ICD-10-CM | POA: Diagnosis not present

## 2018-07-16 DIAGNOSIS — M5030 Other cervical disc degeneration, unspecified cervical region: Secondary | ICD-10-CM | POA: Diagnosis not present

## 2018-07-16 DIAGNOSIS — M5136 Other intervertebral disc degeneration, lumbar region: Secondary | ICD-10-CM | POA: Diagnosis not present

## 2018-07-16 DIAGNOSIS — M4722 Other spondylosis with radiculopathy, cervical region: Secondary | ICD-10-CM | POA: Diagnosis not present

## 2018-07-20 DIAGNOSIS — M25571 Pain in right ankle and joints of right foot: Secondary | ICD-10-CM | POA: Diagnosis not present

## 2018-07-20 DIAGNOSIS — S92324A Nondisplaced fracture of second metatarsal bone, right foot, initial encounter for closed fracture: Secondary | ICD-10-CM | POA: Diagnosis not present

## 2018-07-20 DIAGNOSIS — S93324A Dislocation of tarsometatarsal joint of right foot, initial encounter: Secondary | ICD-10-CM | POA: Diagnosis not present

## 2018-07-27 DIAGNOSIS — M25571 Pain in right ankle and joints of right foot: Secondary | ICD-10-CM | POA: Diagnosis not present

## 2018-07-27 DIAGNOSIS — S92321A Displaced fracture of second metatarsal bone, right foot, initial encounter for closed fracture: Secondary | ICD-10-CM | POA: Diagnosis not present

## 2018-07-27 DIAGNOSIS — S92221A Displaced fracture of lateral cuneiform of right foot, initial encounter for closed fracture: Secondary | ICD-10-CM | POA: Diagnosis not present

## 2018-07-27 DIAGNOSIS — S93324A Dislocation of tarsometatarsal joint of right foot, initial encounter: Secondary | ICD-10-CM | POA: Diagnosis not present

## 2018-08-13 ENCOUNTER — Encounter: Payer: Self-pay | Admitting: Family Medicine

## 2018-08-13 ENCOUNTER — Ambulatory Visit (INDEPENDENT_AMBULATORY_CARE_PROVIDER_SITE_OTHER): Payer: Medicare Other

## 2018-08-13 ENCOUNTER — Ambulatory Visit (INDEPENDENT_AMBULATORY_CARE_PROVIDER_SITE_OTHER): Payer: Medicare Other | Admitting: Family Medicine

## 2018-08-13 VITALS — BP 126/79 | HR 86 | Temp 98.3°F

## 2018-08-13 DIAGNOSIS — E1129 Type 2 diabetes mellitus with other diabetic kidney complication: Secondary | ICD-10-CM

## 2018-08-13 DIAGNOSIS — R809 Proteinuria, unspecified: Secondary | ICD-10-CM

## 2018-08-13 DIAGNOSIS — G35 Multiple sclerosis: Secondary | ICD-10-CM

## 2018-08-13 DIAGNOSIS — R1310 Dysphagia, unspecified: Secondary | ICD-10-CM

## 2018-08-13 DIAGNOSIS — J029 Acute pharyngitis, unspecified: Secondary | ICD-10-CM | POA: Diagnosis not present

## 2018-08-13 DIAGNOSIS — R05 Cough: Secondary | ICD-10-CM | POA: Diagnosis not present

## 2018-08-13 DIAGNOSIS — R42 Dizziness and giddiness: Secondary | ICD-10-CM

## 2018-08-13 DIAGNOSIS — D696 Thrombocytopenia, unspecified: Secondary | ICD-10-CM

## 2018-08-13 LAB — POCT RAPID STREP A (OFFICE): Rapid Strep A Screen: NEGATIVE

## 2018-08-13 MED ORDER — DOXYCYCLINE HYCLATE 100 MG PO TABS: 100.0000 mg | ORAL_TABLET | Freq: Two times a day (BID) | ORAL | 0 refills | Status: DC

## 2018-08-13 MED ORDER — NYSTATIN 100000 UNIT/ML MT SUSP: 5.0000 mL | Freq: Four times a day (QID) | OROMUCOSAL | 1 refills | Status: DC

## 2018-08-13 MED ORDER — PREDNISOLONE SODIUM PHOSPHATE 15 MG/5ML PO SOLN: 60.0000 mg | Freq: Every day | ORAL | 0 refills | Status: DC

## 2018-08-13 MED ORDER — AMBULATORY NON FORMULARY MEDICATION: 0 refills | Status: AC

## 2018-08-13 NOTE — Progress Notes (Signed)
Amber Faulkner is a 58 y.o. female, with MS and history of a splenectomy, partial seizures, and vertigo, who presents to [facility] [place] [facility] today for sore throat and imbalance. Her daughter is her caretaker and provides most of the history.   Sore throat: For the past two weeks, Amber Faulkner has had a very sore throat and worsening pain with swallowing. She cannot swallow solids and can swallow liquids with pain. She has mainly been eating pudding and applesauce. She has vocal cord nodules that need to be removed, but these don't normally cause pain. Also complains of fevers, chronic cough, runny nose, and SOB. Her current inhaler does not relieve her SOB. Denies fevers and N/V.   Imbalance: Three weeks ago, Amber Faulkner had a fall and broke her foot. She is waiting for surgery. She is currently in a walking boot and on crutches. Since then, she has been "shaky, dizzy, weak, and off balance". She has fallen several times since then, especially when getting in the bath. The daughter has to help her walk at all times. She has not hit her head or lost consciousness. She has vertigo and occasionally feels like the room is spinning, but it seems the cause of her falls is the weakness and imbalance.     Skin: Amber Faulkner also complains of "hives" on her stomach, back, and chest that have been very itchy. They are scabbing over now and seem to be resolving.  ROS as above: No headache, visual changes, nausea, vomiting, diarrhea, constipation, dizziness, abdominal pain,, fevers, chills, night sweats, weight loss, swollen lymph nodes, body aches, joint swelling, muscle aches, chest pain, , mood changes, visual or auditory hallucinations.   Exam:  BP 126/79   Pulse 86   Temp 98.3 F (36.8 C) (Oral)  Wt Readings from Last 5 Encounters:  05/29/18 198 lb (89.8 kg)  02/02/18 198 lb (89.8 kg)  05/22/17 196 lb  (88.9 kg)  01/05/17 196 lb (88.9 kg)  02/25/16 203 lb (92.1 kg)    Gen: Well NAD HEENT: EOMI,  MMM, normal oropharynx. No erythema, exudates, or petechiae. Normal sized tonsils. No signs of candida.  Lungs: Normal work of breathing. Expiratory wheezes auscultated in the lower lobes bilaterally. Heart: RRR no MRG Abd: NABS, Soft. Nondistended, Nontender Exts: Brisk capillary refill, warm and well perfused.  Skin: Excoriations and pinpoint scabs present over back, stomach, and chest.   Lab and Radiology Results Results for orders placed or performed in visit on 08/13/18 (from the past 72 hour(s))  POCT rapid strep A     Status: None   Collection Time: 08/13/18  3:57 PM  Result Value Ref Range   Rapid Strep A Screen Negative Negative    Two-view chest x-ray images personally independently reviewed Question left lower lung infiltrate.  No fracture or pneumothorax. Await formal radiology review. X-ray results obtained after patient left the clinic.   Assessment and Plan: 58 y.o. female with  Sore throat: Amber Faulkner has had worsening dysphagia over the past two weeks. She cannot swallow solids and has pain with liquids. Normal oropharynx on exam. Strep test negative. Very concerned about the dysphagia, as she has not able to eat much at all. Referred to digestive health for further evaluation of dysphagia. In the meantime, prescribed nystatin for candidiasis as the potential cause of the dysphagia. Follow-up with me or Dr. Madilyn Fireman in one week.   Cough: She has chronic cough. Amber Faulkner  bilateral expiratory wheezes on exam. Follow-up on CXR. Prescribed doxycycline. Follow-up on labs.    X-ray concerning for pneumonia.  Will contact patient and prescribe possibly Levaquin.  Imbalance: Worsening weakness and imbalance could be related to her low dietary intake over the past two weeks or could be related to her MS. Recommended to digestive health as above to address the dysphagia as the cause of  her decreased oral intake. Advised pt to use a walker rather than crutches. Start home health PT. Prescribed orapred for MS flare.  Skin findings: Likely folliculitis.  Plan to treat with doxycycline.  PDMP not reviewed this encounter. Orders Placed This Encounter  Procedures  . DG Chest 2 View    Order Specific Question:   Reason for exam:    Answer:   Cough, assess intra-thoracic pathology    Order Specific Question:   Is the patient pregnant?    Answer:   No    Order Specific Question:   Preferred imaging location?    Answer:   [facility]  . CBC with Differential/Platelet  . COMPLETE METABOLIC PANEL WITH GFR  . Sedimentation rate  . Ambulatory referral to Home Health    Referral Priority:   Routine    Referral Type:   Home Health Care    Referral Reason:   Specialty Services Required    Requested Specialty:   [place]    Number of Visits Requested:   1  . Ambulatory referral to Gastroenterology    Referral Priority:   Urgent    Referral Type:   Consultation    Referral Reason:   Specialty Services Required    Number of Visits Requested:   1  . POCT rapid strep A   Meds ordered this encounter  Medications  . doxycycline (VIBRA-TABS) 100 MG tablet    Sig: Take 1 tablet (100 mg total) by mouth 2 (two) times daily.    Dispense:  14 tablet    Refill:  0  . nystatin (MYCOSTATIN) 100000 UNIT/ML suspension    Sig: Take 5 mLs (500,000 Units total) by mouth 4 (four) times daily.    Dispense:  473 mL    Refill:  1  . AMBULATORY NON FORMULARY MEDICATION    Sig: Rolling Walker use as needed.  Disp 1 G35    Dispense:  1 each    Refill:  0  . prednisoLONE (ORAPRED) 15 MG/5ML solution    Sig: Take 20 mLs (60 mg total) by mouth daily for 7 days.    Dispense:  227 mL    Refill:  0     Historical information moved to improve visibility of documentation.  Past Medical History:  Diagnosis Date  . ADD (attention deficit disorder)   . Anxiety   . Chronic  pain   . Diabetes mellitus   . HIV infection ([place])   . Hyperlipidemia   . Neuromuscular disorder [facility])    Past Surgical History:  Procedure Laterality Date  . ABDOMINAL HYSTERECTOMY  1991   complete  . CARPAL TUNNEL RELEASE  1993-1994   both wrist  . CERVICAL DISCECTOMY  03/25/2017   Dr. Cyndia Diver, [place]  . CESAREAN SECTION  1980  . HERNIA REPAIR  1976  . SPLENECTOMY  09/2016  . TARSAL TUNNEL RELEASE  1993   left ankle  . TUMOR REMOVAL  1993   left thigh   Social History   Tobacco Use  . Smoking status: Current Every Day Smoker  Packs/day: 1.50    Types: Cigarettes  . Smokeless tobacco: Never Used  Substance Use Topics  . Alcohol use: No   family history includes COPD in her mother; Cancer in her maternal aunt, mother, and sister; Diabetes in her father; Heart disease in her father; Hyperlipidemia in her father; Hypertension in her father.  Medications: Current Outpatient Medications  Medication Sig Dispense Refill  . ADVAIR DISKUS 250-50 MCG/DOSE AEPB Inhale 1 puff into the lungs 2 times daily. 3 each 3  . carisoprodol (SOMA) 350 MG tablet Take 350 mg by mouth 3 (three) times daily.    . DULoxetine (CYMBALTA) 30 MG capsule Take 30 mg by mouth 2 (two) times daily.  11  . EASY TOUCH PEN NEEDLES 32G X 6 MM MISC USE AS DIRECTED 100 each prn  . gabapentin (NEURONTIN) 300 MG capsule Take 1 capsule (300 mg total) by mouth 4 (four) times daily. 120 capsule 3  . insulin lispro (HUMALOG KWIKPEN) 100 UNIT/ML KwikPen Inject 0.15 mLs (15 Units total) into the skin 3 (three) times daily. 15 mL 3  . Lacosamide (VIMPAT) 100 MG TABS Take 100 mg by mouth 2 (two) times daily.     Marland Kitchen LEVEMIR FLEXTOUCH 100 UNIT/ML Pen Inject 14-20 Units into the skin at bedtime. 15 mL 0  . losartan (COZAAR) 25 MG tablet Take 1 tablet (25 mg total) by mouth daily. 90 tablet 1  . nystatin (MYCOSTATIN) 100000 UNIT/ML suspension Take 5 mLs (500,000 Units total) by mouth 4 (four) times daily. 60 mL 0    . omeprazole (PRILOSEC) 40 MG capsule Take 1 capsule (40 mg total) by mouth daily. 90 capsule 3  . ondansetron (ZOFRAN-ODT) 8 MG disintegrating tablet DISSOLVE 1 tablet (8 mg total) ON THE TONGUE every 8 (eight) hours as needed for nausea or vomiting. 20 tablet 0  . ONE TOUCH ULTRA TEST test strip USE TO CHECK BLOOD SUGAR THREE TIMES A DAY 500 each 11  . Oxycodone HCl 20 MG TABS Take 20 mg by mouth every 6 (six) hours as needed.  0  . PHARMACIST CHOICE LANCETS MISC USE TO CHECK BLOOD SUGAR THREE TIMES A DAY 200 each 11  . PROAIR HFA 108 (90 Base) MCG/ACT inhaler Inhale 2 puffs into the lungs every 6 (six) hours as needed for wheezing or shortness of breath. 8.5 g 2  . QUEtiapine (SEROQUEL) 200 MG tablet Take 1 tablet (200 mg total) by mouth at bedtime. 90 tablet 1  . rosuvastatin (CRESTOR) 20 MG tablet Take 1 tablet (20 mg total) by mouth daily. 90 tablet 1  . SPIRIVA HANDIHALER 18 MCG inhalation capsule Place 1 capsule into inhaler and inhale daily. 90 capsule 3  . sucralfate (CARAFATE) 1 g tablet Take 1 tablet (1 g total) by mouth 4 (four) times daily -  with meals and at bedtime. 120 tablet 0  . Topiramate ER (TROKENDI XR) 25 MG CP24 Take by mouth.    . venlafaxine XR (EFFEXOR-XR) 75 MG 24 hr capsule Take 1 capsule (75 mg total) by mouth daily with breakfast. 90 capsule 1   No current facility-administered medications for this visit.    Allergies  Allergen Reactions  . Cephalosporins Anaphylaxis  . Vancomycin Anaphylaxis  . Penicillins Hives and Rash  . Ampicillin     Other reaction(s): anaphylaxis  . Carisoprodol     Other reaction(s): seizures  . Lisinopril Other (See Comments)    Nausea,dizziness,headache  . Penicillin G     Other reaction(s): anaphylaxis  Discussed warning signs or symptoms. Please see discharge instructions. Patient expresses understanding.  I personally was present and performed or re-performed the history, physical exam and medical decision-making  activities of this service and have verified that the service and findings are accurately documented in the student's note. ___________________________________________ Lynne Leader M.D., ABFM., CAQSM. Primary Care and Sports Medicine Adjunct Instructor of [employer]

## 2018-08-13 NOTE — Patient Instructions (Addendum)
Thank you for coming in today. Get labs and xray now.  Use a walker.   Use the fluconazole.   Take the doxycyline.  Take orapred 60mg  daily for 7 days (69ml)   Recheck in 1 week with me or Dr Karna Dupes should hear from [facility] about appointment.   You should also hear from home health PT.       Dysphagia  Dysphagia is trouble swallowing. This condition occurs when solids and liquids stick in a person's throat on the way down to the stomach, or when food takes longer to get to the stomach. You may have problems swallowing food, liquids, or both. You may also have pain while trying to swallow. It may take you more time and effort to swallow something. What are the causes? This condition is caused by:  Problems with the muscles. They may make it difficult for you to move food and liquids through the tube that connects your mouth to your stomach (esophagus). You may have ulcers, scar tissue, or inflammation that blocks the normal passage of food and liquids. Causes of these problems include: ? Acid reflux from your stomach into your esophagus (gastroesophageal reflux). ? Infections. ? Radiation treatment for cancer. ? Medicines taken without enough fluids to wash them down into your stomach.  Nerve problems. These prevent signals from being sent to the muscles of your esophagus to squeeze (contract) and move what you swallow down to your stomach.  Globus pharyngeus. This is a common problem that involves feeling like something is stuck in the throat or a sense of trouble with swallowing even though nothing is wrong with the swallowing passages.  Stroke. This can affect the nerves and make it difficult to swallow.  Certain conditions, such as cerebral palsy or Parkinson disease. What are the signs or symptoms? Common symptoms of this condition include:  A feeling that solids or liquids are stuck in your throat on the way down to the stomach.  Food taking too  long to get to the stomach. Other symptoms include:  Food moving back from your stomach to your mouth (regurgitation).  Noises coming from your throat.  Chest discomfort with swallowing.  A feeling of fullness when swallowing.  Drooling, especially when the throat is blocked.  Pain while swallowing.  Heartburn.  Coughing or gagging while trying to swallow. How is this diagnosed? This condition is diagnosed by:  Barium X-ray. In this test, you swallow a white substance (contrast medium)that sticks to the inside of your esophagus. X-ray images are then taken.  Endoscopy. In this test, a flexible telescope is inserted down your throat to look at your esophagus and your stomach.  CT scans and MRI. How is this treated? Treatment for dysphagia depends on the cause of the condition:  If the dysphagia is caused by acid reflux or infection, medicines may be used. They may include antibiotics and heartburn medicines.  If the dysphagia is caused by problems with your muscles, swallowing therapy may be used to help you strengthen your swallowing muscles. You may have to do specific exercises to strengthen the muscles or stretch them.  If the dysphagia is caused by a blockage or mass, procedures to remove the blockage may be done. You may need surgery and a feeding tube. You may need to make diet changes. Ask your health care provider for specific instructions. Follow these instructions at home: Eating and drinking  Try to eat soft food that is easier to swallow.  Follow any diet changes as told by your health care provider.  Cut your food into small pieces and eat slowly.  Eat and drink only when you are sitting upright.  Do not drink alcohol or caffeine. If you need help quitting, ask your health care provider. General instructions  Check your weight every day to make sure you are not losing weight.  Take over-the-counter and prescription medicines only as told by your health  care provider.  If you were prescribed an antibiotic medicine, take it as told by your health care provider. Do not stop taking the antibiotic even if you start to feel better.  Do not use any products that contain nicotine or tobacco, such as cigarettes and e-cigarettes. If you need help quitting, ask your health care provider.  Keep all follow-up visits as told by your health care provider. This is important. Contact a health care provider if:  You lose weight because you cannot swallow.  You cough when you drink liquids (aspiration).  You cough up partially digested food. Get help right away if:  You cannot swallow your saliva.  You have shortness of breath or a fever, or both.  You have a hoarse voice and also have trouble swallowing. Summary  Dysphagia is trouble swallowing. This condition occurs when solids and liquids stick in a person's throat on the way down to the stomach, or when food takes longer to get to the stomach.  Dysphagia has many possible causes and symptoms.  Treatment for dysphagia depends on the cause of the condition. This information is not intended to replace advice given to you by your health care provider. Make sure you discuss any questions you have with your health care provider. Document Released: 06/24/2000 Document Revised: 06/16/2016 Document Reviewed: 06/16/2016 Elsevier Interactive Patient Education  2019 [employer].

## 2018-08-14 ENCOUNTER — Telehealth: Payer: Self-pay | Admitting: Family Medicine

## 2018-08-14 ENCOUNTER — Observation Stay (HOSPITAL_COMMUNITY)
Admission: EM | Admit: 2018-08-14 | Discharge: 2018-08-15 | Disposition: A | Discharge status: Home / Self Care | Payer: Medicare Other | Attending: Internal Medicine | Admitting: Internal Medicine

## 2018-08-14 ENCOUNTER — Encounter (HOSPITAL_COMMUNITY): Payer: Self-pay

## 2018-08-14 DIAGNOSIS — E1142 Type 2 diabetes mellitus with diabetic polyneuropathy: Secondary | ICD-10-CM | POA: Diagnosis not present

## 2018-08-14 DIAGNOSIS — J449 Chronic obstructive pulmonary disease, unspecified: Secondary | ICD-10-CM | POA: Diagnosis present

## 2018-08-14 DIAGNOSIS — R1310 Dysphagia, unspecified: Secondary | ICD-10-CM | POA: Diagnosis not present

## 2018-08-14 DIAGNOSIS — R569 Unspecified convulsions: Secondary | ICD-10-CM | POA: Diagnosis not present

## 2018-08-14 DIAGNOSIS — Z9081 Acquired absence of spleen: Secondary | ICD-10-CM | POA: Diagnosis not present

## 2018-08-14 DIAGNOSIS — G35 Multiple sclerosis: Secondary | ICD-10-CM | POA: Insufficient documentation

## 2018-08-14 DIAGNOSIS — Z79899 Other long term (current) drug therapy: Secondary | ICD-10-CM | POA: Diagnosis not present

## 2018-08-14 DIAGNOSIS — F17210 Nicotine dependence, cigarettes, uncomplicated: Secondary | ICD-10-CM | POA: Insufficient documentation

## 2018-08-14 DIAGNOSIS — D649 Anemia, unspecified: Secondary | ICD-10-CM | POA: Diagnosis not present

## 2018-08-14 DIAGNOSIS — Z794 Long term (current) use of insulin: Secondary | ICD-10-CM | POA: Diagnosis not present

## 2018-08-14 DIAGNOSIS — I10 Essential (primary) hypertension: Secondary | ICD-10-CM | POA: Diagnosis not present

## 2018-08-14 DIAGNOSIS — J440 Chronic obstructive pulmonary disease with acute lower respiratory infection: Secondary | ICD-10-CM | POA: Insufficient documentation

## 2018-08-14 DIAGNOSIS — E785 Hyperlipidemia, unspecified: Secondary | ICD-10-CM | POA: Diagnosis not present

## 2018-08-14 DIAGNOSIS — D500 Iron deficiency anemia secondary to blood loss (chronic): Principal | ICD-10-CM | POA: Insufficient documentation

## 2018-08-14 DIAGNOSIS — J189 Pneumonia, unspecified organism: Secondary | ICD-10-CM | POA: Insufficient documentation

## 2018-08-14 DIAGNOSIS — D696 Thrombocytopenia, unspecified: Secondary | ICD-10-CM

## 2018-08-14 DIAGNOSIS — IMO0002 Reserved for concepts with insufficient information to code with codable children: Secondary | ICD-10-CM

## 2018-08-14 DIAGNOSIS — G8929 Other chronic pain: Secondary | ICD-10-CM | POA: Diagnosis not present

## 2018-08-14 DIAGNOSIS — R809 Proteinuria, unspecified: Secondary | ICD-10-CM

## 2018-08-14 DIAGNOSIS — E1129 Type 2 diabetes mellitus with other diabetic kidney complication: Secondary | ICD-10-CM

## 2018-08-14 DIAGNOSIS — B20 Human immunodeficiency virus [HIV] disease: Secondary | ICD-10-CM | POA: Diagnosis not present

## 2018-08-14 DIAGNOSIS — D693 Immune thrombocytopenic purpura: Secondary | ICD-10-CM | POA: Insufficient documentation

## 2018-08-14 DIAGNOSIS — E1165 Type 2 diabetes mellitus with hyperglycemia: Secondary | ICD-10-CM

## 2018-08-14 LAB — COMPLETE METABOLIC PANEL WITHOUT GFR
AG Ratio: 0.9 (calc) — ABNORMAL LOW (ref 1.0–2.5)
ALT: 9 U/L (ref 6–29)
AST: 15 U/L (ref 10–35)
Albumin: 2.9 g/dL — ABNORMAL LOW (ref 3.6–5.1)
Alkaline phosphatase (APISO): 116 U/L (ref 37–153)
BUN/Creatinine Ratio: 27 (calc) — ABNORMAL HIGH (ref 6–22)
BUN: 12 mg/dL (ref 7–25)
CO2: 25 mmol/L (ref 20–32)
Calcium: 7.8 mg/dL — ABNORMAL LOW (ref 8.6–10.4)
Chloride: 115 mmol/L — ABNORMAL HIGH (ref 98–110)
Creat: 0.45 mg/dL — ABNORMAL LOW (ref 0.50–1.05)
GFR, Est African American: 131 mL/min/1.73m2
GFR, Est Non African American: 113 mL/min/1.73m2
Globulin: 3.1 g/dL (ref 1.9–3.7)
Glucose, Bld: 129 mg/dL — ABNORMAL HIGH (ref 65–99)
Potassium: 4 mmol/L (ref 3.5–5.3)
Sodium: 145 mmol/L (ref 135–146)
Total Bilirubin: 0.3 mg/dL (ref 0.2–1.2)
Total Protein: 6 g/dL — ABNORMAL LOW (ref 6.1–8.1)

## 2018-08-14 LAB — GLUCOSE, CAPILLARY
Glucose-Capillary: 110 mg/dL — ABNORMAL HIGH (ref 70–99)
Glucose-Capillary: 166 mg/dL — ABNORMAL HIGH (ref 70–99)

## 2018-08-14 LAB — CBC WITH DIFFERENTIAL/PLATELET
Absolute Monocytes: 1600 {cells}/uL — ABNORMAL HIGH (ref 200–950)
Basophils Absolute: 174 {cells}/uL (ref 0–200)
Basophils Relative: 1.4 %
Eosinophils Absolute: 74 {cells}/uL (ref 15–500)
Eosinophils Relative: 0.6 %
HCT: 22 % — ABNORMAL LOW (ref 35.0–45.0)
Hemoglobin: 5.5 g/dL — CL (ref 11.7–15.5)
Lymphs Abs: 1463 {cells}/uL (ref 850–3900)
MCH: 16.5 pg — ABNORMAL LOW (ref 27.0–33.0)
MCHC: 25 g/dL — ABNORMAL LOW (ref 32.0–36.0)
MCV: 65.9 fL — ABNORMAL LOW (ref 80.0–100.0)
Monocytes Relative: 12.9 %
Neutro Abs: 9089 {cells}/uL — ABNORMAL HIGH (ref 1500–7800)
Neutrophils Relative %: 73.3 %
Platelets: 141 Thousand/uL (ref 140–400)
RBC: 3.34 Million/uL — ABNORMAL LOW (ref 3.80–5.10)
RDW: 23.1 % — ABNORMAL HIGH (ref 11.0–15.0)
Total Lymphocyte: 11.8 %
WBC: 12.4 Thousand/uL — ABNORMAL HIGH (ref 3.8–10.8)

## 2018-08-14 LAB — COMPREHENSIVE METABOLIC PANEL
ALT: 12 U/L (ref 0–44)
AST: 17 U/L (ref 15–41)
Albumin: 2.4 g/dL — ABNORMAL LOW (ref 3.5–5.0)
Alkaline Phosphatase: 111 U/L (ref 38–126)
Anion gap: 9 (ref 5–15)
BUN: 9 mg/dL (ref 6–20)
CO2: 20 mmol/L — AB (ref 22–32)
Calcium: 8.2 mg/dL — ABNORMAL LOW (ref 8.9–10.3)
Chloride: 116 mmol/L — ABNORMAL HIGH (ref 98–111)
Creatinine, Ser: 0.56 mg/dL (ref 0.44–1.00)
GFR calc Af Amer: >60 mL/min (ref >60)
GFR calc non Af Amer: >60 mL/min (ref >60)
GLUCOSE: 171 mg/dL — AB (ref 70–99)
Potassium: 4 mmol/L (ref 3.5–5.1)
SODIUM: 145 mmol/L (ref 135–145)
Total Bilirubin: 0.3 mg/dL (ref 0.3–1.2)
Total Protein: 6.2 g/dL — ABNORMAL LOW (ref 6.5–8.1)

## 2018-08-14 LAB — CBC MORPHOLOGY

## 2018-08-14 LAB — CBC
HEMATOCRIT: 22.9 % — AB (ref 36.0–46.0)
Hemoglobin: 5.5 g/dL — CL (ref 12.0–15.0)
MCH: 16.5 pg — ABNORMAL LOW (ref 26.0–34.0)
MCHC: 24 g/dL — ABNORMAL LOW (ref 30.0–36.0)
MCV: 68.6 fL — ABNORMAL LOW (ref 80.0–100.0)
Platelets: 126 10*3/uL — ABNORMAL LOW (ref 150–400)
RBC: 3.34 MIL/uL — ABNORMAL LOW (ref 3.87–5.11)
RDW: 26 % — ABNORMAL HIGH (ref 11.5–15.5)
WBC: 11.3 10*3/uL — ABNORMAL HIGH (ref 4.0–10.5)
nRBC: 11.9 % — ABNORMAL HIGH (ref 0.0–0.2)

## 2018-08-14 LAB — RETICULOCYTES
Immature Retic Fract: 33.2 % — ABNORMAL HIGH (ref 2.3–15.9)
RBC.: 3.47 MIL/uL — ABNORMAL LOW (ref 3.87–5.11)
Retic Count, Absolute: 81.9 10*3/uL (ref 19.0–186.0)
Retic Ct Pct: 2.4 % (ref 0.4–3.1)

## 2018-08-14 LAB — PREPARE RBC (CROSSMATCH)

## 2018-08-14 LAB — FOLATE: Folate: 16.4 ng/mL (ref >5.9)

## 2018-08-14 LAB — ABO/RH: ABO/RH(D): O POS

## 2018-08-14 LAB — FERRITIN: Ferritin: 9 ng/mL — ABNORMAL LOW (ref 11–307)

## 2018-08-14 LAB — IRON AND TIBC
Iron: 9 ug/dL — ABNORMAL LOW (ref 28–170)
Saturation Ratios: 3 % — ABNORMAL LOW (ref 10.4–31.8)
TIBC: 347 ug/dL (ref 250–450)
UIBC: 338 ug/dL

## 2018-08-14 LAB — SEDIMENTATION RATE: Sed Rate: 31 mm/h — ABNORMAL HIGH (ref 0–30)

## 2018-08-14 LAB — POC OCCULT BLOOD, ED: Fecal Occult Bld: POSITIVE — AB

## 2018-08-14 LAB — VITAMIN B12: Vitamin B-12: 607 pg/mL (ref 180–914)

## 2018-08-14 MED ORDER — PANTOPRAZOLE SODIUM 40 MG PO TBEC
40.0000 mg | DELAYED_RELEASE_TABLET | Freq: Every day | ORAL | Status: DC
  Administered 2018-08-15: 40 mg via ORAL
  Filled 2018-08-14: qty 1

## 2018-08-14 MED ORDER — PREDNISOLONE SODIUM PHOSPHATE 15 MG/5ML PO SOLN
60.0000 mg | Freq: Every day | ORAL | Status: DC
  Administered 2018-08-15: 60 mg via ORAL
  Filled 2018-08-14 (×2): qty 20

## 2018-08-14 MED ORDER — CARISOPRODOL 350 MG PO TABS
350.0000 mg | ORAL_TABLET | Freq: Three times a day (TID) | ORAL | Status: DC
  Administered 2018-08-14 – 2018-08-15 (×3): 350 mg via ORAL
  Filled 2018-08-14 (×3): qty 1

## 2018-08-14 MED ORDER — MOMETASONE FURO-FORMOTEROL FUM 200-5 MCG/ACT IN AERO
2.0000 | INHALATION_SPRAY | Freq: Two times a day (BID) | RESPIRATORY_TRACT | Status: DC
  Administered 2018-08-14 – 2018-08-15 (×2): 2 via RESPIRATORY_TRACT
  Filled 2018-08-14: qty 8.8

## 2018-08-14 MED ORDER — DULOXETINE HCL 30 MG PO CPEP
30.0000 mg | ORAL_CAPSULE | Freq: Two times a day (BID) | ORAL | Status: DC
  Administered 2018-08-14 – 2018-08-15 (×2): 30 mg via ORAL
  Filled 2018-08-14 (×2): qty 1

## 2018-08-14 MED ORDER — ACETAMINOPHEN 650 MG RE SUPP: 650.0000 mg | Freq: Four times a day (QID) | RECTAL | Status: DC | PRN

## 2018-08-14 MED ORDER — UMECLIDINIUM BROMIDE 62.5 MCG/INH IN AEPB
1.0000 | INHALATION_SPRAY | Freq: Every day | RESPIRATORY_TRACT | Status: DC
  Administered 2018-08-15: 1 via RESPIRATORY_TRACT
  Filled 2018-08-14: qty 7

## 2018-08-14 MED ORDER — TIOTROPIUM BROMIDE MONOHYDRATE 18 MCG IN CAPS
18.0000 ug | ORAL_CAPSULE | Freq: Every day | RESPIRATORY_TRACT | Status: DC
  Filled 2018-08-14: qty 5

## 2018-08-14 MED ORDER — QUETIAPINE FUMARATE 50 MG PO TABS
200.0000 mg | ORAL_TABLET | Freq: Every day | ORAL | Status: DC
  Administered 2018-08-14: 200 mg via ORAL
  Filled 2018-08-14: qty 4

## 2018-08-14 MED ORDER — INSULIN ASPART 100 UNIT/ML ~~LOC~~ SOLN
0.0000 [IU] | Freq: Three times a day (TID) | SUBCUTANEOUS | Status: DC
  Administered 2018-08-15 (×2): 2 [IU] via SUBCUTANEOUS

## 2018-08-14 MED ORDER — OXYCODONE HCL ER 20 MG PO T12A
20.0000 mg | EXTENDED_RELEASE_TABLET | Freq: Two times a day (BID) | ORAL | Status: DC
  Administered 2018-08-14: 20 mg via ORAL
  Filled 2018-08-14: qty 2

## 2018-08-14 MED ORDER — SODIUM CHLORIDE 0.9 % IV SOLN
10.0000 mL/h | Freq: Once | INTRAVENOUS | Status: AC
  Administered 2018-08-14: 10 mL/h via INTRAVENOUS

## 2018-08-14 MED ORDER — ONDANSETRON HCL 4 MG PO TABS: 4.0000 mg | ORAL_TABLET | Freq: Four times a day (QID) | ORAL | Status: DC | PRN

## 2018-08-14 MED ORDER — NICOTINE 21 MG/24HR TD PT24
21.0000 mg | MEDICATED_PATCH | TRANSDERMAL | Status: DC
  Administered 2018-08-14: 21 mg via TRANSDERMAL
  Filled 2018-08-14: qty 1

## 2018-08-14 MED ORDER — INSULIN DETEMIR 100 UNIT/ML ~~LOC~~ SOLN
20.0000 [IU] | Freq: Two times a day (BID) | SUBCUTANEOUS | Status: DC
  Administered 2018-08-14 – 2018-08-15 (×2): 20 [IU] via SUBCUTANEOUS
  Filled 2018-08-14 (×3): qty 0.2

## 2018-08-14 MED ORDER — SODIUM CHLORIDE 0.9 % IV SOLN
510.0000 mg | Freq: Once | INTRAVENOUS | Status: AC
  Administered 2018-08-14: 510 mg via INTRAVENOUS
  Filled 2018-08-14: qty 17

## 2018-08-14 MED ORDER — GABAPENTIN 300 MG PO CAPS
300.0000 mg | ORAL_CAPSULE | Freq: Four times a day (QID) | ORAL | Status: DC
  Administered 2018-08-14 – 2018-08-15 (×4): 300 mg via ORAL
  Filled 2018-08-14 (×4): qty 1

## 2018-08-14 MED ORDER — ACETAMINOPHEN 325 MG PO TABS
650.0000 mg | ORAL_TABLET | Freq: Four times a day (QID) | ORAL | Status: DC | PRN
  Administered 2018-08-14 – 2018-08-15 (×2): 650 mg via ORAL
  Filled 2018-08-14 (×2): qty 2

## 2018-08-14 MED ORDER — LACOSAMIDE 50 MG PO TABS
100.0000 mg | ORAL_TABLET | Freq: Two times a day (BID) | ORAL | Status: DC
  Administered 2018-08-14 – 2018-08-15 (×2): 100 mg via ORAL
  Filled 2018-08-14 (×2): qty 2

## 2018-08-14 MED ORDER — LEVOFLOXACIN 750 MG PO TABS
750.0000 mg | ORAL_TABLET | Freq: Every day | ORAL | Status: DC
  Administered 2018-08-14 – 2018-08-15 (×2): 750 mg via ORAL
  Filled 2018-08-14 (×2): qty 1

## 2018-08-14 MED ORDER — ONDANSETRON HCL 4 MG/2ML IJ SOLN: 4.0000 mg | Freq: Four times a day (QID) | INTRAMUSCULAR | Status: DC | PRN

## 2018-08-14 MED ORDER — OXYCODONE HCL 5 MG PO TABS
20.0000 mg | ORAL_TABLET | ORAL | Status: DC | PRN
  Administered 2018-08-14 – 2018-08-15 (×5): 20 mg via ORAL
  Filled 2018-08-14 (×5): qty 4

## 2018-08-14 MED ORDER — ROSUVASTATIN CALCIUM 20 MG PO TABS
20.0000 mg | ORAL_TABLET | Freq: Every day | ORAL | Status: DC
  Administered 2018-08-15: 20 mg via ORAL
  Filled 2018-08-14: qty 1

## 2018-08-14 MED ORDER — INSULIN ASPART 100 UNIT/ML ~~LOC~~ SOLN: 0.0000 [IU] | Freq: Every day | SUBCUTANEOUS | Status: DC

## 2018-08-14 NOTE — Telephone Encounter (Signed)
I received a call from the after hours nurse.  Hb critically low at 5.5  I called the patient and advised to go to the ED. She states that she will in the morning.

## 2018-08-14 NOTE — ED Notes (Signed)
Patient transported to X-ray 

## 2018-08-14 NOTE — ED Triage Notes (Signed)
Pt presents for evaluation of low hemoglobin. Reports extreme weakness, sent from PCP.

## 2018-08-14 NOTE — ED Provider Notes (Signed)
[facility] EMERGENCY DEPARTMENT Provider Note   CSN: [id number] Arrival date & time: 08/14/18  6378   History   Chief Complaint Chief Complaint  Patient presents with  . Abnormal Lab    HPI Amber Faulkner is a 58 y.o. female with a PMH of MS, Diabetes Mellitus, and COPD presents due to an abnormal lab.  Patient reports she was seen by PCP yesterday and advised to come to the ER due to a low hemoglobin. Patient reports she has had a productive cough, congestion, chills, and worsening shortness of breath onset 2.5 weeks ago. Patient reports she had clots of blood after blowing her nose and coughing 2 weeks ago, but states bleeding has resolved. Patient denies abdominal pain, nausea, vomiting, diarrhea, or blood in stool. Patient denies taking blood thinners. Patient states she broke her foot at the beginning of January and has been followed by Orthopedics. Patient denies numbness, paresthesias, syncope, or dizziness. Patient reports she has had intermittent blurry vision over the past 2.5 weeks. Patient reports she has a history of seizures and takes Topiramate. Patient denies recent surgery, recent travel, leg pain/edema, chest pain, or hx DVT/PE. Patient reports her neurologist is Dr. Berdine Addison and she last saw him 3 months ago.   HPI  Past Medical History:  Diagnosis Date  . ADD (attention deficit disorder)   . Anxiety   . Chronic pain   . Diabetes mellitus   . HIV infection ([place])   . Hyperlipidemia   . Neuromuscular disorder [facility]     Patient Active Problem List   Diagnosis Date Noted  . Anemia 08/14/2018  . Migraine headache 02/02/2018  . Diabetes mellitus with proteinuria ([place]) 01/05/2017  . Uncontrolled type 2 diabetes mellitus with diabetic polyneuropathy, with long-term current use of insulin ([place]) 10/27/2015  . History of stroke 07/16/2015  . COPD, moderate ([place]) 04/21/2015  . Cervical spinal cord compression ([place]) 04/14/2015  . PTSD (post-traumatic stress  disorder) 02/04/2015  . Multiple sclerosis exacerbation ([place]) 12/05/2014  . Neuropathy 12/30/2011  . EDEMA 09/03/2010  . DYSPNEA 09/03/2010  . Thrombocytopenia ([place]) 06/02/2010  . KNEE PAIN, LEFT 05/26/2009  . SMOKER 04/28/2009  . BACK PAIN, LUMBAR 01/28/2009  . Depression 09/02/2008  . INSOMNIA 09/02/2008  . Hyperlipidemia 02/21/2008  . Essential hypertension 02/21/2008    Past Surgical History:  Procedure Laterality Date  . ABDOMINAL HYSTERECTOMY  1991   complete  . CARPAL TUNNEL RELEASE  1993-1994   both wrist  . CERVICAL DISCECTOMY  03/25/2017   Dr. Cyndia Diver, [place]  . CESAREAN SECTION  1980  . HERNIA REPAIR  1976  . SPLENECTOMY  09/2016  . TARSAL TUNNEL RELEASE  1993   left ankle  . TUMOR REMOVAL  1993   left thigh     OB History   No obstetric history on file.      Home Medications    Prior to Admission medications   Medication Sig Start Date End Date Taking? Authorizing Provider  ADVAIR DISKUS 250-50 MCG/DOSE AEPB Inhale 1 puff into the lungs 2 times daily. 05/29/18   Hali Marry, MD  AMBULATORY NON FORMULARY MEDICATION Rolling Walker use as needed.  Disp 1 G35 08/13/18   Gregor Hams, MD  carisoprodol (SOMA) 350 MG tablet Take 350 mg by mouth 3 (three) times daily.    Lawrence Marseilles, MD  doxycycline (VIBRA-TABS) 100 MG tablet Take 1 tablet (100 mg total) by mouth 2 (two) times daily. 08/13/18   Georgina Snell,  Rebekah Chesterfield, MD  DULoxetine (CYMBALTA) 30 MG capsule Take 30 mg by mouth 2 (two) times daily. 04/26/18   [provider]  EASY TOUCH PEN NEEDLES 32G X 6 MM MISC USE AS DIRECTED 09/26/14   Hali Marry, MD  gabapentin (NEURONTIN) 300 MG capsule Take 1 capsule (300 mg total) by mouth 4 (four) times daily. 06/20/18   Hali Marry, MD  insulin lispro (HUMALOG KWIKPEN) 100 UNIT/ML KwikPen Inject 0.15 mLs (15 Units total) into the skin 3 (three) times daily. 05/29/18   Hali Marry, MD  Lacosamide (VIMPAT) 100 MG TABS  Take 100 mg by mouth 2 (two) times daily.  12/25/15   [provider]  LEVEMIR FLEXTOUCH 100 UNIT/ML Pen Inject 14-20 Units into the skin at bedtime. 03/13/18   Hali Marry, MD  losartan (COZAAR) 25 MG tablet Take 1 tablet (25 mg total) by mouth daily. 03/13/18   Hali Marry, MD  nystatin (MYCOSTATIN) 100000 UNIT/ML suspension Take 5 mLs (500,000 Units total) by mouth 4 (four) times daily. 08/13/18   Gregor Hams, MD  omeprazole (PRILOSEC) 40 MG capsule Take 1 capsule (40 mg total) by mouth daily. 03/13/18   Hali Marry, MD  ondansetron (ZOFRAN-ODT) 8 MG disintegrating tablet DISSOLVE 1 tablet (8 mg total) ON THE TONGUE every 8 (eight) hours as needed for nausea or vomiting. 03/13/18   Hali Marry, MD  ONE TOUCH ULTRA TEST test strip USE TO CHECK BLOOD SUGAR THREE TIMES A DAY 04/23/18   Hali Marry, MD  Oxycodone HCl 20 MG TABS Take 20 mg by mouth every 6 (six) hours as needed. 05/24/18   [provider]  PHARMACIST CHOICE LANCETS MISC USE TO CHECK BLOOD SUGAR THREE TIMES A DAY 04/23/18   Hali Marry, MD  prednisoLONE (ORAPRED) 15 MG/5ML solution Take 20 mLs (60 mg total) by mouth daily for 7 days. 08/13/18 08/20/18  Gregor Hams, MD  PROAIR HFA 108 [phone] Base) MCG/ACT inhaler Inhale 2 puffs into the lungs every 6 (six) hours as needed for wheezing or shortness of breath. 03/13/18   Hali Marry, MD  QUEtiapine (SEROQUEL) 200 MG tablet Take 1 tablet (200 mg total) by mouth at bedtime. 05/29/18   Hali Marry, MD  rosuvastatin (CRESTOR) 20 MG tablet Take 1 tablet (20 mg total) by mouth daily. 03/13/18   Hali Marry, MD  SPIRIVA HANDIHALER 18 MCG inhalation capsule Place 1 capsule into inhaler and inhale daily. 05/29/18   Hali Marry, MD  sucralfate (CARAFATE) 1 g tablet Take 1 tablet (1 g total) by mouth 4 (four) times daily -  with meals and at bedtime. 05/29/18   Hali Marry, MD  Topiramate ER  (TROKENDI XR) 25 MG CP24 Take by mouth.    [provider]  venlafaxine XR (EFFEXOR-XR) 75 MG 24 hr capsule Take 1 capsule (75 mg total) by mouth daily with breakfast. 03/13/18   Hali Marry, MD    Family History Family History  Problem Relation Age of Onset  . Cancer Sister        hepatic and pancreatic  . Cancer Mother        brain and lung  . COPD Mother   . Hyperlipidemia Father   . Hypertension Father   . Diabetes Father   . Heart disease Father   . Cancer Maternal Aunt        breast    Social History Social History  Tobacco Use  . Smoking status: Current Every Day Smoker    Packs/day: 1.50    Types: Cigarettes  . Smokeless tobacco: Never Used  Substance Use Topics  . Alcohol use: No  . Drug use: No     Allergies   Cephalosporins; Vancomycin; Penicillins; Ampicillin; Carisoprodol; Lisinopril; and Penicillin g   Review of Systems Review of Systems  Constitutional: Negative for activity change, appetite change, chills, diaphoresis, fatigue, fever and unexpected weight change.  HENT: Positive for congestion, rhinorrhea and sore throat.   Eyes: Positive for visual disturbance. Negative for photophobia.  Respiratory: Positive for cough and shortness of breath. Negative for chest tightness.   Cardiovascular: Negative for chest pain, palpitations and leg swelling.  Gastrointestinal: Negative for abdominal pain, blood in stool, diarrhea, nausea and vomiting.  Endocrine: Negative for cold intolerance and heat intolerance.  Genitourinary: Negative for dysuria and frequency.  Musculoskeletal: Positive for back pain (Pt reports chronic back pain.). Negative for myalgias and neck pain.  Skin: Negative for wound.  Allergic/Immunologic: Positive for immunocompromised state.  Neurological: Positive for seizures and weakness. Negative for dizziness, tremors, syncope, facial asymmetry, speech difficulty, light-headedness, numbness and headaches.    Psychiatric/Behavioral: Negative for confusion, dysphoric mood and sleep disturbance. The patient is not nervous/anxious.     Physical Exam Updated Vital Signs BP (!) 127/91   Pulse 84   Temp 98.2 F (36.8 C) (Oral)   Resp 18   SpO2 96%   Physical Exam Vitals signs and nursing note reviewed.  Constitutional:      General: She is not in acute distress.    Appearance: She is well-developed. She is not diaphoretic.  HENT:     Head: Normocephalic and atraumatic.     Right Ear: Tympanic membrane, ear canal and external ear normal.     Left Ear: Tympanic membrane, ear canal and external ear normal.     Nose: Congestion and rhinorrhea present.     Mouth/Throat:     Mouth: Mucous membranes are moist.     Pharynx: Posterior oropharyngeal erythema present. No oropharyngeal exudate.  Eyes:     General: No scleral icterus.       Right eye: No discharge.        Left eye: No discharge.     Extraocular Movements: Extraocular movements intact.     Conjunctiva/sclera: Conjunctivae normal.     Pupils: Pupils are equal, round, and reactive to light.  Neck:     Musculoskeletal: Normal range of motion and neck supple.  Cardiovascular:     Rate and Rhythm: Normal rate and regular rhythm.     Heart sounds: Normal heart sounds. No murmur. No friction rub. No gallop.   Pulmonary:     Effort: Pulmonary effort is normal. No respiratory distress.     Breath sounds: Examination of the right-lower field reveals rales. Examination of the left-lower field reveals rales. Rales present. No wheezing or rhonchi.  Abdominal:     Palpations: Abdomen is soft.     Tenderness: There is no abdominal tenderness.  Genitourinary:    Rectum: Guaiac result positive. No tenderness, anal fissure or external hemorrhoid. Normal anal tone.  Musculoskeletal: Normal range of motion.  Skin:    General: Skin is warm.     Findings: No erythema or rash.  Neurological:     Mental Status: She is alert and oriented to  person, place, and time.    Mental Status:  Alert, oriented, thought content appropriate, able to give a coherent  history. Speech fluent without evidence of aphasia. Able to follow 2 step commands without difficulty.  Cranial Nerves:  II:  Peripheral visual fields grossly normal, pupils equal, round, reactive to light III,IV, VI: ptosis not present, extra-ocular motions intact bilaterally  V,VII: smile symmetric, facial light touch sensation equal VIII: hearing grossly normal to voice  X: uvula elevates symmetrically  XI: bilateral shoulder shrug symmetric and strong XII: midline tongue extension without fassiculations Motor:  Normal tone. 4/5 in upper and lower extremities bilaterally including strong and equal grip strength and dorsiflexion/plantar flexion Sensory: light touch normal in all extremities.  Deep Tendon Reflexes: 2+ and symmetric in the biceps and patella Cerebellar: normal finger-to-nose with bilateral upper extremities Gait: slow ambulation and normal balance due to weakness. CV: distal pulses palpable throughout    ED Treatments / Results  Labs (all labs ordered are listed, but only abnormal results are displayed) Labs Reviewed  COMPREHENSIVE METABOLIC PANEL - Abnormal; Notable for the following components:      Result Value   Chloride 116 (*)    CO2 20 (*)    Glucose, Bld 171 (*)    Calcium 8.2 (*)    Total Protein 6.2 (*)    Albumin 2.4 (*)    All other components within normal limits  CBC - Abnormal; Notable for the following components:   WBC 11.3 (*)    RBC 3.34 (*)    Hemoglobin 5.5 (*)    HCT 22.9 (*)    MCV 68.6 (*)    MCH 16.5 (*)    MCHC 24.0 (*)    RDW 26.0 (*)    Platelets 126 (*)    nRBC 11.9 (*)    All other components within normal limits  IRON AND TIBC - Abnormal; Notable for the following components:   Iron 9 (*)    Saturation Ratios 3 (*)    All other components within normal limits  FERRITIN - Abnormal; Notable for the following  components:   Ferritin 9 (*)    All other components within normal limits  RETICULOCYTES - Abnormal; Notable for the following components:   RBC. 3.47 (*)    Immature Retic Fract 33.2 (*)    All other components within normal limits  VITAMIN B12  FOLATE  POC OCCULT BLOOD, ED  TYPE AND SCREEN  ABO/RH  PREPARE RBC (CROSSMATCH)    EKG None  Radiology Dg Chest 2 View  Result Date: 08/14/2018 CLINICAL DATA:  Cough for 3 weeks. EXAM: CHEST - 2 VIEW COMPARISON:  05/24/2016 FINDINGS: The cardiac silhouette, mediastinal and hilar contours are within normal limits and stable. Mild chronic emphysematous changes. Patchy bibasilar airspace opacities consistent with pneumonia. No pleural effusion. No worrisome pulmonary lesions. The bony thorax is intact. IMPRESSION: Bibasilar infiltrates, left greater than right. Followup PA and lateral chest X-ray is recommended in 3-4 weeks following trial of antibiotic therapy to ensure resolution and exclude underlying malignancy. Electronically Signed   By: Marijo Sanes M.D.   On: 08/14/2018 08:55    Procedures Procedures (including critical care time)  Medications Ordered in ED Medications  0.9 %  sodium chloride infusion (has no administration in time range)  oxyCODONE (OXYCONTIN) 12 hr tablet 20 mg (20 mg Oral Given 08/14/18 1306)     Initial Impression / Assessment and Plan / ED Course  I have reviewed the triage vital signs and the nursing notes.  Pertinent labs & imaging results that were available during my care of the patient were reviewed  by me and considered in my medical decision making (see chart for details).  Clinical Course as of Aug 14 1430  Tue Aug 14, 2018  1303 Leukocytosis noted at 11.3. Suspect this is likely due to pneumonia.   WBC(!): 11.3 [AH]    Clinical Course User Index [AH] Arville Lime, PA-C   Suspect symptoms are likely due to symptomatic anemia. Occult blood test was positive. CXR reveals bibasilar infiltrates,  left greater than right consistent with pneumonia. Leukocytosis noted at 11.3 Patient was started on antibiotics yesterday. Provided 2 units of blood while in the ER. Discussed case with hospitalist. Hospitalist has agreed to admit the patient. Findings and plan of care discussed with supervising physician Dr. Regenia Skeeter.  CRITICAL CARE Performed by: [place]  Total critical care time:  40 minutes  Critical care time was exclusive of separately billable procedures and treating other patients.  Critical care was necessary to treat or prevent imminent or life-threatening deterioration.  Critical care was time spent personally by me on the following activities: development of treatment plan with patient and/or surrogate as well as nursing, discussions with consultants, evaluation of patient's response to treatment, examination of patient, obtaining history from patient or surrogate, ordering and performing treatments and interventions, ordering and review of laboratory studies, ordering and review of radiographic studies, pulse oximetry and re-evaluation of patient's condition.  Final Clinical Impressions(s) / ED Diagnoses   Final diagnoses:  Symptomatic anemia  Community acquired pneumonia, unspecified laterality    ED Discharge Orders    None       Arville Lime, [place] 08/14/18 1451    Sherwood Gambler, MD 08/15/18 [phone]

## 2018-08-14 NOTE — Plan of Care (Signed)
  Problem: Education: °Goal: Knowledge of General Education information will improve °Description: Including pain rating scale, medication(s)/side effects and non-pharmacologic comfort measures °Outcome: Progressing °  °Problem: Health Behavior/Discharge Planning: °Goal: Ability to manage health-related needs will improve °Outcome: Progressing °  °Problem: Clinical Measurements: °Goal: Ability to maintain clinical measurements within normal limits will improve °Outcome: Progressing °  °Problem: Activity: °Goal: Risk for activity intolerance will decrease °Outcome: Progressing °  °Problem: Coping: °Goal: Level of anxiety will decrease °Outcome: Progressing °  °Problem: Safety: °Goal: Ability to remain free from injury will improve °Outcome: Progressing °  °Problem: Skin Integrity: °Goal: Risk for impaired skin integrity will decrease °Outcome: Progressing °  °

## 2018-08-14 NOTE — H&P (Signed)
History and Physical  Patient Name: Amber Faulkner     VOJ:[id number]    DOB: 03/12/1961    DOA: 08/14/2018 PCP: Hali Marry, MD  Patient coming from: Home  Chief Complaint: Abnormal labs, weakness, fatigue      HPI: Amber Faulkner is a 58 y.o. F with hx chronic refractory ITP s/p splenectomy, questionable diagnosis SLE not on immune suppressant, COPD no O2, still smoking, HTN, IDDM, recurrent seizures and MS, ambulatory and not on therapy who presents with weakness and abnormal labs.  Patient's symptom constellation is hard to parse.  Initially, she thinks she developed sore throat and cough, three weeks ago.  This was associated with at least one episode of epistaxis and bright red hemoptysis, none since, no progression of cough.  Around that time, she also noticed new fatigue and weakness.  She did have a small rash that developed, on the shoulders, chest abdomen only, folliculitis in character.  She has also noticed several breakthrough seizures, including one that made her fall on her foot and break it.  Now in the last two weeks, her fatigue and sore throat have progressed.  Fatigue worsened to the point she has to stop and rest just walking across her 1 BR apartment.  The sore throat worsened to the point that she hurst with any solid food.  Finally yesterday she was seen at [facility] office.  Dr. Georgina Snell obtained BMP, CBC and CXR.  CXR showed pneumonia and she was started on Levaquin and prednisolone.  She was given an urgent GI referral.  Overnight, Hgb returned 5.5 g/dL, patient called and sent to ER.  ED course: - Afebrile, heart rate 90, respirations and pulse ox normal, blood pressure 130/77 -Na 145, K 4.0, Cr 0.56, WBC 11.3K, Hgb 5.5, Plts 123K - MCV 68 -Reticuloctyte Index low 1.3 -Ferritin 9, iron saturation less than 10, folate and B12 normal -Total bilirubin normal -Chest x-ray showed bilateral lower lobe opacities, patchy, bronchopneumonia pattern -She was started on  blood transfusion and the hospitalist service was called.    Her last Hgb in our system or care Everywhere is 2018 and was 11.  Ferritin in 2018 was also extremely low, hasn't been rechecked that I see.  I can find no colonoscopy records in our system or at [facility] or [place].  She denies any melena or hematochezia at all. Is post-menopausal.  No family history of auto-immune disease.  Reports son has had low platelets but never bleeding and never treatment by her.      ROS: Review of Systems  Constitutional: Positive for malaise/fatigue. Negative for chills and fever.  HENT: Positive for sore throat. Negative for congestion and sinus pain.   Respiratory: Positive for cough and hemoptysis. Negative for sputum production, shortness of breath, wheezing and stridor.   Cardiovascular: Negative for chest pain and leg swelling.  Gastrointestinal: Negative for blood in stool and melena.  Musculoskeletal: Negative for joint pain.  Skin: Positive for rash. Negative for itching.  Neurological: Positive for dizziness and seizures. Negative for sensory change, speech change, focal weakness, loss of consciousness, weakness and headaches.  All other systems reviewed and are negative.         Past Medical History:  Diagnosis Date  . ADD (attention deficit disorder)   . Anxiety   . Chronic pain   . Diabetes mellitus   . HIV infection ([place])   . Hyperlipidemia   . Neuromuscular disorder [facility])     Past Surgical History:  Procedure Laterality Date  . ABDOMINAL HYSTERECTOMY  1991   complete  . CARPAL TUNNEL RELEASE  1993-1994   both wrist  . CERVICAL DISCECTOMY  03/25/2017   Dr. Cyndia Diver, [place]  . CESAREAN SECTION  1980  . HERNIA REPAIR  1976  . SPLENECTOMY  09/2016  . TARSAL TUNNEL RELEASE  1993   left ankle  . TUMOR REMOVAL  1993   left thigh    Social History: Patient lives with partner.  The patient walks unassisted.  Smoker.  Allergies  Allergen Reactions  .  Cephalosporins Anaphylaxis  . Vancomycin Anaphylaxis  . Penicillins Hives and Rash  . Ampicillin     Other reaction(s): anaphylaxis  . Carisoprodol     Other reaction(s): seizures  . Lisinopril Other (See Comments)    Nausea,dizziness,headache  . Penicillin G     Other reaction(s): anaphylaxis    Family history: family history includes COPD in her mother; Cancer in her maternal aunt, mother, and sister; Diabetes in her father; Heart disease in her father; Hyperlipidemia in her father; Hypertension in her father.  Prior to Admission medications   Medication Sig Start Date End Date Taking? Authorizing Provider  ADVAIR DISKUS 250-50 MCG/DOSE AEPB Inhale 1 puff into the lungs 2 times daily. 05/29/18   Hali Marry, MD  AMBULATORY NON FORMULARY MEDICATION Rolling Walker use as needed.  Disp 1 G35 08/13/18   Gregor Hams, MD  carisoprodol (SOMA) 350 MG tablet Take 350 mg by mouth 3 (three) times daily.    Lawrence Marseilles, MD  doxycycline (VIBRA-TABS) 100 MG tablet Take 1 tablet (100 mg total) by mouth 2 (two) times daily. 08/13/18   Gregor Hams, MD  DULoxetine (CYMBALTA) 30 MG capsule Take 30 mg by mouth 2 (two) times daily. 04/26/18   [provider]  EASY TOUCH PEN NEEDLES 32G X 6 MM MISC USE AS DIRECTED 09/26/14   Hali Marry, MD  gabapentin (NEURONTIN) 300 MG capsule Take 1 capsule (300 mg total) by mouth 4 (four) times daily. 06/20/18   Hali Marry, MD  insulin lispro (HUMALOG KWIKPEN) 100 UNIT/ML KwikPen Inject 0.15 mLs (15 Units total) into the skin 3 (three) times daily. 05/29/18   Hali Marry, MD  Lacosamide (VIMPAT) 100 MG TABS Take 100 mg by mouth 2 (two) times daily.  12/25/15   [provider]  LEVEMIR FLEXTOUCH 100 UNIT/ML Pen Inject 14-20 Units into the skin at bedtime. 03/13/18   Hali Marry, MD  losartan (COZAAR) 25 MG tablet Take 1 tablet (25 mg total) by mouth daily. 03/13/18   Hali Marry, MD  nystatin  (MYCOSTATIN) 100000 UNIT/ML suspension Take 5 mLs (500,000 Units total) by mouth 4 (four) times daily. 08/13/18   Gregor Hams, MD  omeprazole (PRILOSEC) 40 MG capsule Take 1 capsule (40 mg total) by mouth daily. 03/13/18   Hali Marry, MD  ondansetron (ZOFRAN-ODT) 8 MG disintegrating tablet DISSOLVE 1 tablet (8 mg total) ON THE TONGUE every 8 (eight) hours as needed for nausea or vomiting. 03/13/18   Hali Marry, MD  ONE TOUCH ULTRA TEST test strip USE TO CHECK BLOOD SUGAR THREE TIMES A DAY 04/23/18   Hali Marry, MD  Oxycodone HCl 20 MG TABS Take 20 mg by mouth every 6 (six) hours as needed. 05/24/18   [provider]  PHARMACIST CHOICE LANCETS MISC USE TO CHECK BLOOD SUGAR THREE TIMES A DAY 04/23/18   Hali Marry, MD  prednisoLONE (ORAPRED) 15 MG/5ML solution Take 20 mLs (60 mg total) by mouth daily for 7 days. 08/13/18 08/20/18  Gregor Hams, MD  PROAIR HFA 108 [phone] Base) MCG/ACT inhaler Inhale 2 puffs into the lungs every 6 (six) hours as needed for wheezing or shortness of breath. 03/13/18   Hali Marry, MD  QUEtiapine (SEROQUEL) 200 MG tablet Take 1 tablet (200 mg total) by mouth at bedtime. 05/29/18   Hali Marry, MD  rosuvastatin (CRESTOR) 20 MG tablet Take 1 tablet (20 mg total) by mouth daily. 03/13/18   Hali Marry, MD  SPIRIVA HANDIHALER 18 MCG inhalation capsule Place 1 capsule into inhaler and inhale daily. 05/29/18   Hali Marry, MD  sucralfate (CARAFATE) 1 g tablet Take 1 tablet (1 g total) by mouth 4 (four) times daily -  with meals and at bedtime. 05/29/18   Hali Marry, MD  Topiramate ER (TROKENDI XR) 25 MG CP24 Take by mouth.    [provider]  venlafaxine XR (EFFEXOR-XR) 75 MG 24 hr capsule Take 1 capsule (75 mg total) by mouth daily with breakfast. 03/13/18   Hali Marry, MD       Physical Exam: BP 127/74   Pulse 93   Temp 99.2 F (37.3 C) (Oral)   Resp 20   SpO2  95%  General appearance: Well-developed, adult female, alert and in no acute distress.   Eyes: Anicteric, conjunctiva pink, lids and lashes normal. PERRL.    ENT: No nasal deformity, discharge, epistaxis.  Hearing normal. OP moist without lesions.  Dentures in place, lips normal.  No posterior erythema, no candida. Neck: No neck masses.  Trachea midline.  No thyromegaly/tenderness. Lymph: Shotty cervical LA, no supraclavicular or axillary lymphadenopathy. Skin: Warm and dry.  No jaundice.  There are some old healing papular red spots on abdomen, chest, no rash on shoudlers, face, neck, back, arms or legs. Cardiac: RRR, nl S1-S2, no murmurs appreciated.  Capillary refill is brisk.  JVP not visible.  No LE edema.  Radial pulses 2+ and symmetric. Respiratory: Normal respiratory rate and rhythm.  CTAB without rales or wheezes. Abdomen: Abdomen soft.  No TTP. No ascites, distension, hepatosplenomegaly.   MSK: No deformities or effusions of the large joints of the upper or lower extremities bilaterally.  No cyanosis or clubbing.  Leg in boot. Neuro: Cranial nerves 3-12 intact.  Sensation intact to light touch. Speech is fluent.  Muscle strength normal.    Psych: Sensorium intact and responding to questions, attention normal.  Behavior appropriate.  Affect normal.  Judgment and insight appear normal.     Labs on Admission:  I have personally reviewed following labs and imaging studies: CBC: Recent Labs  Lab 08/13/18 1616 08/14/18 1006  WBC 12.4* 11.3*  NEUTROABS 9,089*  --   HGB 5.5* 5.5*  HCT 22.0* 22.9*  MCV 65.9* 68.6*  PLT 141 606*   Basic Metabolic Panel: Recent Labs  Lab 08/13/18 1616 08/14/18 1006  NA 145 145  K 4.0 4.0  CL 115* 116*  CO2 25 20*  GLUCOSE 129* 171*  BUN 12 9  CREATININE 0.45* 0.56  CALCIUM 7.8* 8.2*   GFR: CrCl cannot be calculated (Unknown ideal weight.).  Liver Function Tests: Recent Labs  Lab 08/13/18 1616 08/14/18 1006  AST 15 17  ALT 9 12    ALKPHOS  --  111  BILITOT 0.3 0.3  PROT 6.0* 6.2*  ALBUMIN  --  2.4*   Anemia Panel:  Recent Labs    08/14/18 1258 08/14/18 1302  VITAMINB12 607  --   FOLATE  --  16.4  FERRITIN 9*  --   TIBC 347  --   IRON 9*  --   RETICCTPCT 2.4  --           Radiological Exams on Admission: Personally reviewed CXR shows bilateral patchy mild opacities, no effusion: Dg Chest 2 View  Result Date: 08/14/2018 CLINICAL DATA:  Cough for 3 weeks. EXAM: CHEST - 2 VIEW COMPARISON:  05/24/2016 FINDINGS: The cardiac silhouette, mediastinal and hilar contours are within normal limits and stable. Mild chronic emphysematous changes. Patchy bibasilar airspace opacities consistent with pneumonia. No pleural effusion. No worrisome pulmonary lesions. The bony thorax is intact. IMPRESSION: Bibasilar infiltrates, left greater than right. Followup PA and lateral chest X-ray is recommended in 3-4 weeks following trial of antibiotic therapy to ensure resolution and exclude underlying malignancy. Electronically Signed   By: Marijo Sanes M.D.   On: 08/14/2018 08:55           Assessment/Plan  Symptomatic anemia Iron deficiency anemia Chronic blood loss anemia This is clearly microcytic, hypoproliferative and with severe iron deficiency with +FOBT.  Doubt autoimmune component.    With regard to her chronic blood loss.  She is post-menopausal.  She has no active bleeding.  Has been referred to Dr. Lucienne Capers from [place].  -Transfuse 2 units now -Administer feraheme -Discharge with oral iron  -Will forward discharge summary to Dr. Lucienne Capers -Recommend close follow up with patient's Hematologist Dr. Rudean Hitt after discharge    Community-acquired pneumonia PSI 59.  Appropriate for outpatient treatment. Would maintain low threshold to consider non-infectious causes of her infiltrates -Change to Levaquin 750 mg daily given COPD, splenectomy -Repeat CXR in 4-6  weeks  Sore throat I do not have a unifying diagnosis for her sore throat, rash and anemia.   Hypertension Blood pressure normal -Continue home losartan  Diabetes Because normal -Continue home Levemir -Continue home gabapentin -SSI corrections in hospital  COPD Active disease -Continue home inhalers Advair and Spiriva  Partial seizures -Continue home Vimpat, topiramate  Chronic ITP status post splenectomy  Chronic pain -Continue home oxycontin -Continue home venlafaxine, Cymbalta, Soma  Multiple sclerosis No active disease      DVT prophylaxis: SCDs  Code Status: FULL  Family Communication: None present  Disposition Plan: Anticipate transfusion overnight.  She reports being able to take PO well and her labs show no dehydration.  Will replete blod and iron, discharge likely tomorrow with close GI and hematology follow up Consults called: None Admission status: OBS   At the point of initial evaluation, it is my clinical opinion that admission for OBSERVATION is reasonable and necessary because the patient's presenting complaints in the context of their chronic conditions represent sufficient risk of deterioration or significant morbidity to constitute reasonable grounds for close observation in the hospital setting, but that the patient may be medically stable for discharge from the hospital within 24 to 48 hours.    Medical decision making: Patient seen at 3:21 PM on 08/14/2018.  The patient was discussed with ED staff.  What exists of the patient's chart was reviewed in depth and summarized above.  Clinical condition: stable.        [facility] Pager: please page via [email or web address]

## 2018-08-14 NOTE — Plan of Care (Signed)
  Problem: Education: Goal: Knowledge of General Education information will improve Description Including pain rating scale, medication(s)/side effects and non-pharmacologic comfort measures 08/14/2018 1828 by Ottie Glazier, RN Outcome: Progressing 08/14/2018 1518 by Ottie Glazier, RN Outcome: Progressing   Problem: Health Behavior/Discharge Planning: Goal: Ability to manage health-related needs will improve 08/14/2018 1828 by Ottie Glazier, RN Outcome: Progressing 08/14/2018 1518 by Ottie Glazier, RN Outcome: Progressing   Problem: Clinical Measurements: Goal: Ability to maintain clinical measurements within normal limits will improve 08/14/2018 1828 by Ottie Glazier, RN Outcome: Progressing 08/14/2018 1518 by Ottie Glazier, RN Outcome: Progressing   Problem: Activity: Goal: Risk for activity intolerance will decrease 08/14/2018 1828 by Ottie Glazier, RN Outcome: Progressing 08/14/2018 1518 by Ottie Glazier, RN Outcome: Progressing   Problem: Nutrition: Goal: Adequate nutrition will be maintained Outcome: Progressing   Problem: Coping: Goal: Level of anxiety will decrease 08/14/2018 1828 by Ottie Glazier, RN Outcome: Progressing 08/14/2018 1518 by Ottie Glazier, RN Outcome: Progressing   Problem: Pain Managment: Goal: General experience of comfort will improve Outcome: Progressing   Problem: Safety: Goal: Ability to remain free from injury will improve 08/14/2018 1828 by Ottie Glazier, RN Outcome: Progressing 08/14/2018 1518 by Ottie Glazier, RN Outcome: Progressing   Problem: Skin Integrity: Goal: Risk for impaired skin integrity will decrease 08/14/2018 1828 by Ottie Glazier, RN Outcome: Progressing 08/14/2018 1518 by Ottie Glazier, RN Outcome: Progressing

## 2018-08-14 NOTE — ED Notes (Signed)
Per Levada Dy in mini lab, pt's POC occult blood was positive; EDP notified

## 2018-08-14 NOTE — Telephone Encounter (Signed)
Contacted patient this morning and again advised to go to ED.  Patient is going now.  [facility] charge nurse advised of patient situation and faxed note and recent lab results.

## 2018-08-15 ENCOUNTER — Encounter (HOSPITAL_COMMUNITY): Payer: Self-pay

## 2018-08-15 ENCOUNTER — Other Ambulatory Visit: Payer: Self-pay

## 2018-08-15 DIAGNOSIS — D649 Anemia, unspecified: Secondary | ICD-10-CM

## 2018-08-15 DIAGNOSIS — D500 Iron deficiency anemia secondary to blood loss (chronic): Secondary | ICD-10-CM | POA: Diagnosis not present

## 2018-08-15 LAB — BASIC METABOLIC PANEL
Anion gap: 6 (ref 5–15)
BUN: 7 mg/dL (ref 6–20)
CO2: 21 mmol/L — ABNORMAL LOW (ref 22–32)
Calcium: 7.9 mg/dL — ABNORMAL LOW (ref 8.9–10.3)
Chloride: 112 mmol/L — ABNORMAL HIGH (ref 98–111)
Creatinine, Ser: 0.64 mg/dL (ref 0.44–1.00)
GFR calc Af Amer: >60 mL/min (ref >60)
GFR calc non Af Amer: >60 mL/min (ref >60)
Glucose, Bld: 182 mg/dL — ABNORMAL HIGH (ref 70–99)
Potassium: 4.1 mmol/L (ref 3.5–5.1)
SODIUM: 139 mmol/L (ref 135–145)

## 2018-08-15 LAB — GLUCOSE, CAPILLARY
GLUCOSE-CAPILLARY: 125 mg/dL — AB (ref 70–99)
Glucose-Capillary: 67 mg/dL — ABNORMAL LOW (ref 70–99)
Glucose-Capillary: 108 mg/dL — ABNORMAL HIGH (ref 70–99)
Glucose-Capillary: 129 mg/dL — ABNORMAL HIGH (ref 70–99)
Glucose-Capillary: 146 mg/dL — ABNORMAL HIGH (ref 70–99)

## 2018-08-15 LAB — TYPE AND SCREEN
ABO/RH(D): O POS
Antibody Screen: NEGATIVE
Unit division: 0
Unit division: 0

## 2018-08-15 LAB — BPAM RBC
Blood Product Expiration Date: 202003082359
Blood Product Expiration Date: 202003082359
ISSUE DATE / TIME: 202002041317
ISSUE DATE / TIME: 202002041637
Unit Type and Rh: 5100
Unit Type and Rh: 5100

## 2018-08-15 LAB — CBC
HCT: 26.2 % — ABNORMAL LOW (ref 36.0–46.0)
HEMOGLOBIN: 7.2 g/dL — AB (ref 12.0–15.0)
MCH: 19.8 pg — ABNORMAL LOW (ref 26.0–34.0)
MCHC: 27.5 g/dL — ABNORMAL LOW (ref 30.0–36.0)
MCV: 72 fL — ABNORMAL LOW (ref 80.0–100.0)
Platelets: 97 10*3/uL — ABNORMAL LOW (ref 150–400)
RBC: 3.64 MIL/uL — ABNORMAL LOW (ref 3.87–5.11)
RDW: 28.1 % — ABNORMAL HIGH (ref 11.5–15.5)
WBC: 11.7 10*3/uL — ABNORMAL HIGH (ref 4.0–10.5)
nRBC: 9.9 % — ABNORMAL HIGH (ref 0.0–0.2)

## 2018-08-15 LAB — PATHOLOGIST SMEAR REVIEW

## 2018-08-15 MED ORDER — IPRATROPIUM-ALBUTEROL 0.5-2.5 (3) MG/3ML IN SOLN
3.0000 mL | Freq: Four times a day (QID) | RESPIRATORY_TRACT | Status: DC
  Administered 2018-08-15: 3 mL via RESPIRATORY_TRACT
  Filled 2018-08-15: qty 3

## 2018-08-15 MED ORDER — LEVOFLOXACIN 750 MG PO TABS: 750.0000 mg | ORAL_TABLET | Freq: Every day | ORAL | 0 refills | Status: DC

## 2018-08-15 MED ORDER — INSULIN DETEMIR 100 UNIT/ML FLEXPEN: 20.0000 [IU] | PEN_INJECTOR | Freq: Two times a day (BID) | SUBCUTANEOUS | Status: DC

## 2018-08-15 MED ORDER — ONDANSETRON 8 MG PO TBDP: 8.0000 mg | ORAL_TABLET | Freq: Three times a day (TID) | ORAL | Status: AC | PRN

## 2018-08-15 MED ORDER — FERROUS SULFATE 325 (65 FE) MG PO TABS: 325.0000 mg | ORAL_TABLET | Freq: Every day | ORAL | 0 refills | Status: AC

## 2018-08-15 NOTE — Discharge Summary (Signed)
Physician Discharge Summary  Amber Faulkner JSH:[id number] DOB: 26-Jun-1961 DOA: 08/14/2018  PCP: Hali Marry, MD  Admit date: 08/14/2018 Discharge date: 08/15/2018  Time spent: 45 minutes  Recommendations for Outpatient Follow-up:  1. Follow up with PCP 1-2 weeks for evaluation of Hg. Repeat chest xray 4 -6 weeks 2. Keep scheduled appointments with gastroenterology and hematology next week as noted in instructions     Discharge Diagnoses:  Principal Problem:   Symptomatic anemia Active Problems:   Essential hypertension   COPD, moderate ([place])   Community acquired pneumonia   Thrombocytopenia ([place])   Uncontrolled type 2 diabetes mellitus with diabetic polyneuropathy, with long-term current use of insulin ([place])   Odynophagia   Discharge Condition: stable  Diet recommendation: carb modified  There were no vitals filed for this visit.  History of present illness:  Amber Faulkner is a 58 y.o. F with hx chronic refractory ITP s/p splenectomy, questionable diagnosis SLE not on immune suppressant, COPD no O2, still smoking, HTN, IDDM, recurrent seizures and MS, ambulatory and not on therapy who presented to ED 08/14/18 with weakness and abnormal labs. 3 weeks ago she had a fall and broke her foot. Is waiting for surgery. Since that time has been weak, dizzy. Reports several falls. Denies injury or loss of consciousness. In addition has had sore throat and difficulty swallowing.   Hospital Course:  Symptomatic anemia/Iron deficiency anemia/Chronic blood loss anemia. Hg 5.5 on admission. Provided with 2 units PRBC's and IV iron. Hg 7.2 at discharge which is close to baseline. Microcytic, hypoproliferative and with severe iron deficiency with +FOBT. She has no active bleeding.  Has been referred to Dr. Lucienne Capers from [place] and has appointment next week. -Discharged with oral iron. Has appointment with Dr Sherral Hammers next week as  well  Community-acquired pneumonia Levaquin 750 mg daily for 5 days given COPD, splenectomy -Repeat CXR in 4-6 weeks  Sore throat Strep test negative. Resolved at discharge. Tolerating meals without problem   Hypertension fair control.   Diabetes. Fair control. A1c 6.5 3 months ago. continue home regime  COPD. Appears to be at baseline. Some sob with conversation. Not on home oxygen. Oxygen saturation level greater than 90% on room air. Provided nebulizer treatments.  Recommend close OP follow up especially in light of # 2.  Partial seizures -Continue home Vimpat, topiramate  Chronic ITP status post splenectomy  Chronic pain -Continue home oxycontin -Continue home venlafaxine, Cymbalta, Soma  Multiple sclerosis No active disease    Procedures: Consultations:    Discharge Exam: Vitals:   08/15/18 1020 08/15/18 1204  BP:  (!) 152/92  Pulse: 95 86  Resp:  18  Temp:  100 F (37.8 C)  SpO2:  91%    General: sitting up in bed on phone ordering lunch. No acute distress Cardiovascular: rrr no mgr no LE edema Respiratory: mild increased work of breathing with conversation. BS somewhat diminished with faint end expiratory wheeze no crackles  Discharge Instructions   Discharge Instructions    Diet - low sodium heart healthy   Complete by:  As directed    Discharge instructions   Complete by:  As directed    Follow up with PCP 1-2 weeks for evaluation of symptoms and post hospital visit Keep scheduled appointments with Dr. Sherral Hammers and Dr. Verdon Cummins Recommend close monitoring of Hemaglobin   Increase activity slowly   Complete by:  As directed      Allergies as of 08/15/2018  Reactions   Cephalosporins Anaphylaxis   Vancomycin Anaphylaxis   Penicillins Hives, Rash   Ampicillin Other (See Comments)   Other reaction(s): anaphylaxis   Carisoprodol Other (See Comments)   Other reaction(s): seizures   Lisinopril Other (See Comments)    Nausea,dizziness,headache   Penicillin G Other (See Comments)   Other reaction(s): anaphylaxis      Medication List    STOP taking these medications   nystatin 100000 UNIT/ML suspension Commonly known as:  MYCOSTATIN   prednisoLONE 15 MG/5ML solution Commonly known as:  ORAPRED     TAKE these medications   ADVAIR DISKUS 250-50 MCG/DOSE Aepb Generic drug:  Fluticasone-Salmeterol Inhale 1 puff into the lungs 2 times daily.   AMBULATORY NON FORMULARY MEDICATION Rolling Walker use as needed.  Disp 1 G35   carisoprodol 350 MG tablet Commonly known as:  SOMA Take 350 mg by mouth 3 (three) times daily.   DULoxetine 30 MG capsule Commonly known as:  CYMBALTA Take 30 mg by mouth 2 (two) times daily.   EASY TOUCH PEN NEEDLES 32G X 6 MM Misc Generic drug:  Insulin Pen Needle USE AS DIRECTED   ferrous sulfate 325 (65 FE) MG tablet Take 1 tablet (325 mg total) by mouth daily.   gabapentin 300 MG capsule Commonly known as:  NEURONTIN Take 1 capsule (300 mg total) by mouth 4 (four) times daily.   Insulin Detemir 100 UNIT/ML Pen Commonly known as:  LEVEMIR FLEXTOUCH Inject 20 Units into the skin 2 (two) times daily. What changed:  See the new instructions.   insulin lispro 100 UNIT/ML KwikPen Commonly known as:  HUMALOG KWIKPEN Inject 0.15 mLs (15 Units total) into the skin 3 (three) times daily.   levofloxacin 750 MG tablet Commonly known as:  LEVAQUIN Take 1 tablet (750 mg total) by mouth daily. Start taking on:  August 16, 2018   losartan 25 MG tablet Commonly known as:  COZAAR Take 1 tablet (25 mg total) by mouth daily.   omeprazole 40 MG capsule Commonly known as:  PRILOSEC Take 1 capsule (40 mg total) by mouth daily.   ondansetron 8 MG disintegrating tablet Commonly known as:  ZOFRAN-ODT Take 1 tablet (8 mg total) by mouth every 8 (eight) hours as needed for nausea or vomiting. What changed:  See the new instructions.   ONE TOUCH ULTRA TEST test  strip Generic drug:  glucose blood USE TO CHECK BLOOD SUGAR THREE TIMES A DAY   Oxycodone HCl 20 MG Tabs Take 20 mg by mouth every 4 (four) hours as needed (pain).   PHARMACIST CHOICE LANCETS Misc USE TO CHECK BLOOD SUGAR THREE TIMES A DAY   PROAIR HFA 108 (90 Base) MCG/ACT inhaler Generic drug:  albuterol Inhale 2 puffs into the lungs every 6 (six) hours as needed for wheezing or shortness of breath. What changed:  See the new instructions.   QUEtiapine 200 MG tablet Commonly known as:  SEROQUEL Take 1 tablet (200 mg total) by mouth at bedtime.   rosuvastatin 20 MG tablet Commonly known as:  CRESTOR Take 1 tablet (20 mg total) by mouth daily. What changed:  See the new instructions.   SPIRIVA HANDIHALER 18 MCG inhalation capsule Generic drug:  tiotropium Place 1 capsule into inhaler and inhale daily.   sucralfate 1 g tablet Commonly known as:  CARAFATE Take 1 tablet (1 g total) by mouth 4 (four) times daily -  with meals and at bedtime.   venlafaxine XR 75 MG 24 hr capsule Commonly  known as:  EFFEXOR-XR Take 1 capsule (75 mg total) by mouth daily with breakfast.   VIMPAT 100 MG Tabs Generic drug:  Lacosamide Take 100 mg by mouth 2 (two) times daily.      Allergies  Allergen Reactions  . Cephalosporins Anaphylaxis  . Vancomycin Anaphylaxis  . Penicillins Hives and Rash  . Ampicillin Other (See Comments)    Other reaction(s): anaphylaxis  . Carisoprodol Other (See Comments)    Other reaction(s): seizures  . Lisinopril Other (See Comments)    Nausea,dizziness,headache  . Penicillin G Other (See Comments)    Other reaction(s): anaphylaxis   Follow-up Information    Hali Marry, MD.   Specialty:  Family Medicine Contact information: [address] [place] [address] [phone]        Hart Robinsons, MD. Go on 08/21/2018.   Specialty:  Hematology and Oncology Why:  apt at 3pm. please arrive 75 with copay, insurance info,  photo id, list of meds.  Contact information: [address] [place] [address] [phone]        Lucienne Capers, MD. Go on 08/22/2018.   Specialty:  Internal Medicine Why:  apt at 1:15pm           The results of significant diagnostics from this hospitalization (including imaging, microbiology, ancillary and laboratory) are listed below for reference.    Significant Diagnostic Studies: Dg Chest 2 View  Result Date: 08/14/2018 CLINICAL DATA:  Cough for 3 weeks. EXAM: CHEST - 2 VIEW COMPARISON:  05/24/2016 FINDINGS: The cardiac silhouette, mediastinal and hilar contours are within normal limits and stable. Mild chronic emphysematous changes. Patchy bibasilar airspace opacities consistent with pneumonia. No pleural effusion. No worrisome pulmonary lesions. The bony thorax is intact. IMPRESSION: Bibasilar infiltrates, left greater than right. Followup PA and lateral chest X-ray is recommended in 3-4 weeks following trial of antibiotic therapy to ensure resolution and exclude underlying malignancy. Electronically Signed   By: Marijo Sanes M.D.   On: 08/14/2018 08:55    Microbiology: No results found for this or any previous visit (from the past 240 hour(s)).   Labs: Basic Metabolic Panel: Recent Labs  Lab 08/13/18 1616 08/14/18 1006 08/14/18 2353  NA 145 145 139  K 4.0 4.0 4.1  CL 115* 116* 112*  CO2 25 20* 21*  GLUCOSE 129* 171* 182*  BUN 12 9 7   CREATININE 0.45* 0.56 0.64  CALCIUM 7.8* 8.2* 7.9*   Liver Function Tests: Recent Labs  Lab 08/13/18 1616 08/14/18 1006  AST 15 17  ALT 9 12  ALKPHOS  --  111  BILITOT 0.3 0.3  PROT 6.0* 6.2*  ALBUMIN  --  2.4*   No results for input(s): LIPASE, AMYLASE in the last 168 hours. No results for input(s): AMMONIA in the last 168 hours. CBC: Recent Labs  Lab 08/13/18 1616 08/14/18 1006 08/14/18 2353  WBC 12.4* 11.3* 11.7*  NEUTROABS 9,089*  --   --   HGB 5.5* 5.5* 7.2*  HCT 22.0* 22.9* 26.2*  MCV  65.9* 68.6* 72.0*  PLT 141 126* 97*   Cardiac Enzymes: No results for input(s): CKTOTAL, CKMB, CKMBINDEX, TROPONINI in the last 168 hours. BNP: BNP (last 3 results) No results for input(s): BNP in the last 8760 hours.  ProBNP (last 3 results) No results for input(s): PROBNP in the last 8760 hours.  CBG: Recent Labs  Lab 08/14/18 2201 08/15/18 0641 08/15/18 0724 08/15/18 0839 08/15/18 1131  GLUCAP 166* 67* 125*  108* 129*       Signed:  Radene Gunning NP [facility] 08/15/2018, 3:38 PM

## 2018-08-15 NOTE — Care Management Obs Status (Signed)
[facility] NOTIFICATION   Patient Details  Name: Amber Faulkner MRN: [id number] Date of Birth: 04/22/1961   Medicare Observation Status Notification Given:  Yes    Midge Minium RN, BSN, NCM-BC, ACM-RN [phone] 08/15/2018, 3:22 PM

## 2018-08-20 ENCOUNTER — Encounter: Payer: Self-pay | Admitting: Family Medicine

## 2018-08-20 ENCOUNTER — Ambulatory Visit (INDEPENDENT_AMBULATORY_CARE_PROVIDER_SITE_OTHER): Payer: Medicare Other | Admitting: Family Medicine

## 2018-08-20 VITALS — BP 135/106 | HR 96 | Wt 190.0 lb

## 2018-08-20 DIAGNOSIS — R21 Rash and other nonspecific skin eruption: Secondary | ICD-10-CM | POA: Diagnosis not present

## 2018-08-20 DIAGNOSIS — D500 Iron deficiency anemia secondary to blood loss (chronic): Secondary | ICD-10-CM

## 2018-08-20 DIAGNOSIS — J189 Pneumonia, unspecified organism: Secondary | ICD-10-CM

## 2018-08-20 LAB — CBC
HCT: 30 % — ABNORMAL LOW (ref 35.0–45.0)
Hemoglobin: 8.6 g/dL — ABNORMAL LOW (ref 11.7–15.5)
MCH: 22.1 pg — AB (ref 27.0–33.0)
MCHC: 28.7 g/dL — AB (ref 32.0–36.0)
MCV: 77.1 fL — ABNORMAL LOW (ref 80.0–100.0)
Platelets: 149 10*3/uL (ref 140–400)
RBC: 3.89 10*6/uL (ref 3.80–5.10)
RDW: 30.8 % — ABNORMAL HIGH (ref 11.0–15.0)
WBC: 19.1 10*3/uL — ABNORMAL HIGH (ref 3.8–10.8)

## 2018-08-20 MED ORDER — TRIAMCINOLONE ACETONIDE 0.1 % EX CREA: 1.0000 "application " | TOPICAL_CREAM | Freq: Two times a day (BID) | CUTANEOUS | 12 refills | Status: AC

## 2018-08-20 NOTE — Patient Instructions (Addendum)
Thank you for coming in today. Get labs now.   Recheck in 2 weeks.   For rash take zyrtec 10mg  (certizine) daily or twice dialy.  Apply the triamcinolone cream as needed   We will discuss oral iron after the stomach doctor.   See if you can remember the iron pills you liked.

## 2018-08-20 NOTE — Progress Notes (Signed)
Amber Faulkner is a 58 y.o. female who presents to [facility] today for follow-up from hospitalization for anemia.   Dysphagia and anemia: Amber Faulkner was here on 02/03 for difficulty swallowing solids. Her CBC revealed Hgb 5.5 and severe iron deficiency, so she was hospitalized for a blood and iron transfusion. She had a +FOBT. She now has a GI appointment on Wednesday for upper endoscopy and colonoscopy to evaluate her dysphagia and +FOBT. The Nystatin did not alleviate any swallowing difficulties. She has not yet started iron supplement.   Pneumonia: Her CXR on 02/03 revealed pneumonia, so she was started on treatment with Levaquin while in the hospital. She has been taking Levaquin since discharge and has one dose remaining.   Imbalance: Her dizziness, weakness, and imbalance has improved somewhat since 02/03. She has been taking orapred for MS flare, which she thinks has been helping with her gait. She is not using crutches today. She uses the walker if she goes out but does not use the walker at home. Denies dizziness or feeling like the room is spinning.     Skin: Amber Faulkner still feels "itchy all over", and she is concerned that the rash is spreading. She has taken doxycycline and tried benadryl and oatmeal baths without relief from itching.    ROS as above:  Exam:  BP (!) 135/106   Pulse 96   Wt 190 lb (86.2 kg)   SpO2 95%   BMI 32.44 kg/m  Wt Readings from Last 5 Encounters:  08/20/18 190 lb (86.2 kg)  05/29/18 198 lb (89.8 kg)  02/02/18 198 lb (89.8 kg)  05/22/17 196 lb (88.9 kg)  01/05/17 196 lb (88.9 kg)    Gen: Well NAD HEENT: EOMI,  MMM Lungs: Normal work of breathing. CTABL Heart: RRR no MRG Abd: NABS, Soft. Nondistended, Nontender Exts: Brisk capillary refill, warm and well perfused.  Skin: Excoriations and pinpoint scabs present over back, stomach,  and chest.  Lab and Radiology Results Dg Chest 2 View  Result Date: 08/14/2018 CLINICAL DATA:  Cough for 3 weeks. EXAM: CHEST - 2 VIEW COMPARISON:  05/24/2016 FINDINGS: The cardiac silhouette, mediastinal and hilar contours are within normal limits and stable. Mild chronic emphysematous changes. Patchy bibasilar airspace opacities consistent with pneumonia. No pleural effusion. No worrisome pulmonary lesions. The bony thorax is intact. IMPRESSION: Bibasilar infiltrates, left greater than right. Followup PA and lateral chest X-ray is recommended in 3-4 weeks following trial of antibiotic therapy to ensure resolution and exclude underlying malignancy. Electronically Signed   By: Marijo Sanes M.D.   On: 08/14/2018 08:55   Lab Results  Component Value Date   WBC 11.7 (H) 08/14/2018   HGB 7.2 (L) 08/14/2018   HCT 26.2 (L) 08/14/2018   MCV 72.0 (L) 08/14/2018   PLT 97 (L) 08/14/2018     Assessment and Plan: 58 y.o. female with  GI Concerns: Amber Faulkner Hgb on 02/03 was 5.5, and she was sent to the hospital for iron and blood transfusions. After transfusion, Amber Faulkner's last Hgb was 7.2. She has her GI appointment for endoscopy and colonoscopy on Wednesday to evaluate for the causes of dysphagia and anemia. Recheck CBC today. Discontinue nystatin since this did not relieve dysphagia. Follow-up in two weeks to discuss results of endoscopy/colonoscopy and recheck CBC. Plan to restart iron supplement after colonoscopy.   Pneumonia: Finish last dose of Levaquin. Get follow-up CXR in 4-6 weeks.  Imbalance: She has had some improvement with the orapred. It is likely that she was having an MS flare. No concerns for vertigo at this time.   Skin findings: Not likely to be folliculitis, as she has taken the doxycycline without relief. Take zyrtec daily and use triamcinolone cream for itching.   Recheck in 2 weeks.    Orders Placed This Encounter  Procedures  . CBC   Meds ordered this encounter    Medications  . triamcinolone cream (KENALOG) 0.1 %    Sig: Apply 1 application topically 2 (two) times daily.    Dispense:  453.6 g    Refill:  12     Historical information moved to improve visibility of documentation.  Past Medical History:  Diagnosis Date  . ADD (attention deficit disorder)   . Anxiety   . Chronic pain   . Diabetes mellitus   . HIV infection ([place])   . Hyperlipidemia   . Neuromuscular disorder [facility])    Past Surgical History:  Procedure Laterality Date  . ABDOMINAL HYSTERECTOMY  1991   complete  . CARPAL TUNNEL RELEASE  1993-1994   both wrist  . CERVICAL DISCECTOMY  03/25/2017   Dr. Cyndia Diver, [place]  . CESAREAN SECTION  1980  . HERNIA REPAIR  1976  . SPLENECTOMY  09/2016  . TARSAL TUNNEL RELEASE  1993   left ankle  . TUMOR REMOVAL  1993   left thigh   Social History   Tobacco Use  . Smoking status: Current Every Day Smoker    Packs/day: 1.50    Types: Cigarettes  . Smokeless tobacco: Never Used  Substance Use Topics  . Alcohol use: No   family history includes COPD in her mother; Cancer in her maternal aunt, mother, and sister; Diabetes in her father; Heart disease in her father; Hyperlipidemia in her father; Hypertension in her father.  Medications: Current Outpatient Medications  Medication Sig Dispense Refill  . ADVAIR DISKUS 250-50 MCG/DOSE AEPB Inhale 1 puff into the lungs 2 times daily. 3 each 3  . AMBULATORY NON FORMULARY MEDICATION Rolling Walker use as needed.  Disp 1 G35 1 each 0  . carisoprodol (SOMA) 350 MG tablet Take 350 mg by mouth 3 (three) times daily.    . DULoxetine (CYMBALTA) 30 MG capsule Take 30 mg by mouth 2 (two) times daily.  11  . EASY TOUCH PEN NEEDLES 32G X 6 MM MISC USE AS DIRECTED 100 each prn  . ferrous sulfate 325 (65 FE) MG tablet Take 1 tablet (325 mg total) by mouth daily. 30 tablet 0  . gabapentin (NEURONTIN) 300 MG capsule Take 1 capsule (300 mg total) by mouth 4 (four) times daily. 120  capsule 3  . Insulin Detemir (LEVEMIR FLEXTOUCH) 100 UNIT/ML Pen Inject 20 Units into the skin 2 (two) times daily.    . insulin lispro (HUMALOG KWIKPEN) 100 UNIT/ML KwikPen Inject 0.15 mLs (15 Units total) into the skin 3 (three) times daily. 15 mL 3  . Lacosamide (VIMPAT) 100 MG TABS Take 100 mg by mouth 2 (two) times daily.     Marland Kitchen levofloxacin (LEVAQUIN) 750 MG tablet Take 1 tablet (750 mg total) by mouth daily. 5 tablet 0  . losartan (COZAAR) 25 MG tablet Take 1 tablet (25 mg total) by mouth daily. 90 tablet 1  . omeprazole (PRILOSEC) 40 MG capsule Take 1 capsule (40 mg total) by mouth daily. 90 capsule 3  . ondansetron (ZOFRAN-ODT) 8 MG disintegrating tablet Take 1 tablet (  8 mg total) by mouth every 8 (eight) hours as needed for nausea or vomiting.    . ONE TOUCH ULTRA TEST test strip USE TO CHECK BLOOD SUGAR THREE TIMES A DAY 500 each 11  . Oxycodone HCl 20 MG TABS Take 20 mg by mouth every 4 (four) hours as needed (pain).   0  . PHARMACIST CHOICE LANCETS MISC USE TO CHECK BLOOD SUGAR THREE TIMES A DAY 200 each 11  . PROAIR HFA 108 (90 Base) MCG/ACT inhaler Inhale 2 puffs into the lungs every 6 (six) hours as needed for wheezing or shortness of breath. (Patient taking differently: Inhale 2 puffs into the lungs every 6 (six) hours as needed for wheezing. ) 8.5 g 2  . QUEtiapine (SEROQUEL) 200 MG tablet Take 1 tablet (200 mg total) by mouth at bedtime. 90 tablet 1  . rosuvastatin (CRESTOR) 20 MG tablet Take 1 tablet (20 mg total) by mouth daily. (Patient taking differently: Take 20 mg by mouth daily. ) 90 tablet 1  . SPIRIVA HANDIHALER 18 MCG inhalation capsule Place 1 capsule into inhaler and inhale daily. 90 capsule 3  . sucralfate (CARAFATE) 1 g tablet Take 1 tablet (1 g total) by mouth 4 (four) times daily -  with meals and at bedtime. 120 tablet 0  . venlafaxine XR (EFFEXOR-XR) 75 MG 24 hr capsule Take 1 capsule (75 mg total) by mouth daily with breakfast. 90 capsule 1  . triamcinolone cream  (KENALOG) 0.1 % Apply 1 application topically 2 (two) times daily. 453.6 g 12   No current facility-administered medications for this visit.    Allergies  Allergen Reactions  . Cephalosporins Anaphylaxis  . Vancomycin Anaphylaxis  . Penicillins Hives and Rash  . Ampicillin Other (See Comments)    Other reaction(s): anaphylaxis  . Carisoprodol Other (See Comments)    Other reaction(s): seizures  . Lisinopril Other (See Comments)    Nausea,dizziness,headache  . Penicillin G Other (See Comments)    Other reaction(s): anaphylaxis     Discussed warning signs or symptoms. Please see discharge instructions. Patient expresses understanding.   I personally was present and performed or re-performed the history, physical exam and medical decision-making activities of this service and have verified that the service and findings are accurately documented in the student's note. ___________________________________________ Lynne Leader M.D., ABFM., CAQSM. Primary Care and Sports Medicine Adjunct Instructor of [employer]

## 2018-08-24 DIAGNOSIS — J449 Chronic obstructive pulmonary disease, unspecified: Secondary | ICD-10-CM | POA: Diagnosis not present

## 2018-08-24 DIAGNOSIS — Z720 Tobacco use: Secondary | ICD-10-CM | POA: Diagnosis not present

## 2018-08-24 DIAGNOSIS — R1319 Other dysphagia: Secondary | ICD-10-CM | POA: Diagnosis not present

## 2018-08-24 DIAGNOSIS — D508 Other iron deficiency anemias: Secondary | ICD-10-CM | POA: Diagnosis not present

## 2018-08-24 DIAGNOSIS — D693 Immune thrombocytopenic purpura: Secondary | ICD-10-CM | POA: Diagnosis not present

## 2018-08-25 ENCOUNTER — Other Ambulatory Visit: Payer: Self-pay | Admitting: Family Medicine

## 2018-08-31 ENCOUNTER — Other Ambulatory Visit: Payer: Self-pay | Admitting: Family Medicine

## 2018-09-01 DIAGNOSIS — J168 Pneumonia due to other specified infectious organisms: Secondary | ICD-10-CM | POA: Diagnosis not present

## 2018-09-01 DIAGNOSIS — Z7951 Long term (current) use of inhaled steroids: Secondary | ICD-10-CM | POA: Diagnosis not present

## 2018-09-01 DIAGNOSIS — Z881 Allergy status to other antibiotic agents status: Secondary | ICD-10-CM | POA: Diagnosis not present

## 2018-09-01 DIAGNOSIS — I10 Essential (primary) hypertension: Secondary | ICD-10-CM | POA: Diagnosis not present

## 2018-09-01 DIAGNOSIS — Z79899 Other long term (current) drug therapy: Secondary | ICD-10-CM | POA: Diagnosis not present

## 2018-09-01 DIAGNOSIS — J449 Chronic obstructive pulmonary disease, unspecified: Secondary | ICD-10-CM | POA: Diagnosis not present

## 2018-09-01 DIAGNOSIS — G35 Multiple sclerosis: Secondary | ICD-10-CM | POA: Diagnosis not present

## 2018-09-01 DIAGNOSIS — R1084 Generalized abdominal pain: Secondary | ICD-10-CM | POA: Diagnosis not present

## 2018-09-01 DIAGNOSIS — Z87892 Personal history of anaphylaxis: Secondary | ICD-10-CM | POA: Diagnosis not present

## 2018-09-01 DIAGNOSIS — R5382 Chronic fatigue, unspecified: Secondary | ICD-10-CM | POA: Diagnosis not present

## 2018-09-01 DIAGNOSIS — R1030 Lower abdominal pain, unspecified: Secondary | ICD-10-CM | POA: Diagnosis not present

## 2018-09-01 DIAGNOSIS — R918 Other nonspecific abnormal finding of lung field: Secondary | ICD-10-CM | POA: Diagnosis not present

## 2018-09-01 DIAGNOSIS — D693 Immune thrombocytopenic purpura: Secondary | ICD-10-CM | POA: Diagnosis not present

## 2018-09-01 DIAGNOSIS — I69959 Hemiplegia and hemiparesis following unspecified cerebrovascular disease affecting unspecified side: Secondary | ICD-10-CM | POA: Diagnosis not present

## 2018-09-01 DIAGNOSIS — Z883 Allergy status to other anti-infective agents status: Secondary | ICD-10-CM | POA: Diagnosis not present

## 2018-09-01 DIAGNOSIS — R911 Solitary pulmonary nodule: Secondary | ICD-10-CM | POA: Diagnosis not present

## 2018-09-01 DIAGNOSIS — Z880 Allergy status to penicillin: Secondary | ICD-10-CM | POA: Diagnosis not present

## 2018-09-01 DIAGNOSIS — E1142 Type 2 diabetes mellitus with diabetic polyneuropathy: Secondary | ICD-10-CM | POA: Diagnosis not present

## 2018-09-01 DIAGNOSIS — M797 Fibromyalgia: Secondary | ICD-10-CM | POA: Diagnosis not present

## 2018-09-02 DIAGNOSIS — R1030 Lower abdominal pain, unspecified: Secondary | ICD-10-CM | POA: Diagnosis not present

## 2018-09-02 DIAGNOSIS — R911 Solitary pulmonary nodule: Secondary | ICD-10-CM | POA: Diagnosis not present

## 2018-09-04 ENCOUNTER — Ambulatory Visit (INDEPENDENT_AMBULATORY_CARE_PROVIDER_SITE_OTHER): Payer: Medicare Other | Admitting: Family Medicine

## 2018-09-04 ENCOUNTER — Encounter: Payer: Self-pay | Admitting: Family Medicine

## 2018-09-04 VITALS — BP 130/75 | HR 86 | Temp 98.2°F | Ht 64.0 in | Wt 178.0 lb

## 2018-09-04 DIAGNOSIS — D649 Anemia, unspecified: Secondary | ICD-10-CM

## 2018-09-04 DIAGNOSIS — J189 Pneumonia, unspecified organism: Secondary | ICD-10-CM | POA: Diagnosis not present

## 2018-09-04 DIAGNOSIS — I10 Essential (primary) hypertension: Secondary | ICD-10-CM

## 2018-09-04 DIAGNOSIS — E1129 Type 2 diabetes mellitus with other diabetic kidney complication: Secondary | ICD-10-CM | POA: Diagnosis not present

## 2018-09-04 DIAGNOSIS — E876 Hypokalemia: Secondary | ICD-10-CM

## 2018-09-04 DIAGNOSIS — M545 Low back pain, unspecified: Secondary | ICD-10-CM

## 2018-09-04 DIAGNOSIS — E1142 Type 2 diabetes mellitus with diabetic polyneuropathy: Secondary | ICD-10-CM | POA: Diagnosis not present

## 2018-09-04 DIAGNOSIS — R809 Proteinuria, unspecified: Secondary | ICD-10-CM

## 2018-09-04 LAB — POCT GLYCOSYLATED HEMOGLOBIN (HGB A1C): HEMOGLOBIN A1C: 5.6 % (ref 4.0–5.6)

## 2018-09-04 MED ORDER — KETOROLAC TROMETHAMINE 60 MG/2ML IM SOLN
60.0000 mg | Freq: Once | INTRAMUSCULAR | Status: AC
  Administered 2018-09-04: 60 mg via INTRAMUSCULAR

## 2018-09-04 MED ORDER — POTASSIUM CHLORIDE CRYS ER 10 MEQ PO TBCR: 10.0000 meq | EXTENDED_RELEASE_TABLET | Freq: Once | ORAL | 3 refills | Status: DC

## 2018-09-04 MED ORDER — SODIUM CHLORIDE 0.9 % IV SOLN
10.00 | INTRAVENOUS | Status: DC
Start: ? — End: 2018-09-04

## 2018-09-04 MED ORDER — GENERIC EXTERNAL MEDICATION
10.00 | Status: DC
Start: ? — End: 2018-09-04

## 2018-09-04 NOTE — Progress Notes (Signed)
Subjective:    CC:   HPI:  Hypertension- Pt denies chest pain, SOB, dizziness, or heart palpitations.  Taking meds as directed w/o problems.  Denies medication side effects.    Diabetes - no hypoglycemic events. No wounds or sores that are not healing well. No increased thirst or urination. Checking glucose at home. Taking medications as prescribed without any side effects.  Simply she had significantly backed off her Levemir and was only taking it once a day because of low blood sugars in the mornings.  She was recently seen and evaluated and treated for pneumonia with Levaquin.  She had a sinus hospital follow-up with 1 of my partners ands he recommended repeat chest x-ray in 4 to 6 weeks.  Since she actually ended up back in the emergency department and actually had a CT of the abdomen and pelvis.  It showed a left lower lobe spiculated mass/lesion which could represent focal pneumonia or possibly lung metastatic disease.  They recommended further work-up.  She is already being referred to pulmonology and has an appointment later this week with Dr. Michela Pitcher.  They did go ahead and put her on another round of Levaquin in case it was pneumonia  Diabetic neuropathy-she reports that she has been in a lot of pain recently she is currently on Cymbalta 60 mg total daily as well as gabapentin.  Was noted to have a low potassium while in the hospital.  She said she used to take a daily supplement but had run out and had then been taking some over-the-counter supplement.  Past medical history, Surgical history, Family history not pertinant except as noted below, Social history, Allergies, and medications have been entered into the medical record, reviewed, and corrections made.   Review of Systems: No fevers, chills, night sweats, weight loss, chest pain, or shortness of breath.   Objective:    General: Well Developed, well nourished, and in no acute distress.  Neuro: Alert and oriented x3,  extra-ocular muscles intact, sensation grossly intact.  HEENT: Normocephalic, atraumatic  Skin: Warm and dry, no rashes. Cardiac: Regular rate and rhythm, no murmurs rubs or gallops, no lower extremity edema.  Respiratory: Clear to auscultation bilaterally. Not using accessory muscles, speaking in full sentences.   Impression and Recommendations:    HTN - repeat BP today is at goal.  Continue to monitor carefully.  DM -well controlled.  Overdue for lab work.  Lab slip provided.  Continue current regimen.  Discussed continue to monitor blood sugars on once a day Levemir or if she needs to restart it she can just use half a dose of 10 units instead of 20 units.  Plan to follow-up in 3 to 4 months.  Left lower lung mass-worrisome for possible malignancy.  Has appointment for consultation on Friday with Dr. Michela Pitcher and she is actually already getting established with hematology oncology for anemia.  They did put her on a second round of Levaquin which she is currently taking in case it is a more focal pneumonia.  Diabetic neuropathy-continue with Cymbalta and gabapentin.  Hypokalemia-she was noted to have low potassium while in the hospital we will plan to recheck that today.  She used to be on a daily supplement and then was taking some over-the-counter but eventually quit.  So we will send over new prescription for 10 mEq daily but she will need to go have her labs rechecked in 1 week.  Anemia-has upcoming appointment with oncology.  She said she received 2 units  of packed red blood cells.

## 2018-09-04 NOTE — Patient Instructions (Signed)
We will need to recheck your potassium in about a week.  We are happy to do that here at our location I will place a lab order so you can go at your convenience.  But if you see Dr. Georgiann Cocker or Dr. Michela Pitcher and they want to recheck it than that is perfectly fine.  Recommend to start with 10 mEq daily and see if that is enough to keep your potassium level balanced.  If not then we can adjust the dose from there.

## 2018-09-05 ENCOUNTER — Telehealth: Payer: Self-pay

## 2018-09-05 NOTE — Telephone Encounter (Signed)
That seems reasonable.

## 2018-09-05 NOTE — Telephone Encounter (Signed)
FYI: Patient called stated that the iron medication that she was taking was Ferrous Sulfate 25 mg. Please advise.Keighley Deckman,CMA

## 2018-09-05 NOTE — Telephone Encounter (Signed)
Patient called stated that the iron medication that she was taking was Ferrous Sulfate 25 mg. Please advise. Yaqueline Gutter,CMA

## 2018-09-05 NOTE — Telephone Encounter (Signed)
Patient advised. Delina Kruczek,CMA

## 2018-09-06 ENCOUNTER — Other Ambulatory Visit: Payer: Self-pay | Admitting: Family Medicine

## 2018-09-07 DIAGNOSIS — G4710 Hypersomnia, unspecified: Secondary | ICD-10-CM | POA: Diagnosis not present

## 2018-09-07 DIAGNOSIS — R0683 Snoring: Secondary | ICD-10-CM | POA: Diagnosis not present

## 2018-09-07 DIAGNOSIS — J449 Chronic obstructive pulmonary disease, unspecified: Secondary | ICD-10-CM | POA: Diagnosis not present

## 2018-09-09 DIAGNOSIS — J189 Pneumonia, unspecified organism: Secondary | ICD-10-CM

## 2018-09-09 HISTORY — DX: Pneumonia, unspecified organism: J18.9

## 2018-09-19 ENCOUNTER — Emergency Department (HOSPITAL_COMMUNITY): Payer: Medicare Other

## 2018-09-19 ENCOUNTER — Inpatient Hospital Stay (HOSPITAL_COMMUNITY)
Admission: EM | Admit: 2018-09-19 | Discharge: 2018-09-26 | DRG: 091 | Disposition: A | Discharge status: Home / Self Care | Payer: Medicare Other | Attending: Internal Medicine | Admitting: Internal Medicine

## 2018-09-19 ENCOUNTER — Other Ambulatory Visit: Payer: Self-pay

## 2018-09-19 DIAGNOSIS — R109 Unspecified abdominal pain: Secondary | ICD-10-CM | POA: Diagnosis present

## 2018-09-19 DIAGNOSIS — Z79899 Other long term (current) drug therapy: Secondary | ICD-10-CM

## 2018-09-19 DIAGNOSIS — Z794 Long term (current) use of insulin: Secondary | ICD-10-CM

## 2018-09-19 DIAGNOSIS — J440 Chronic obstructive pulmonary disease with acute lower respiratory infection: Secondary | ICD-10-CM | POA: Diagnosis present

## 2018-09-19 DIAGNOSIS — Y92009 Unspecified place in unspecified non-institutional (private) residence as the place of occurrence of the external cause: Secondary | ICD-10-CM

## 2018-09-19 DIAGNOSIS — G35 Multiple sclerosis: Secondary | ICD-10-CM | POA: Diagnosis present

## 2018-09-19 DIAGNOSIS — Z825 Family history of asthma and other chronic lower respiratory diseases: Secondary | ICD-10-CM

## 2018-09-19 DIAGNOSIS — Z79891 Long term (current) use of opiate analgesic: Secondary | ICD-10-CM

## 2018-09-19 DIAGNOSIS — F988 Other specified behavioral and emotional disorders with onset usually occurring in childhood and adolescence: Secondary | ICD-10-CM | POA: Diagnosis present

## 2018-09-19 DIAGNOSIS — I10 Essential (primary) hypertension: Secondary | ICD-10-CM | POA: Diagnosis present

## 2018-09-19 DIAGNOSIS — J181 Lobar pneumonia, unspecified organism: Secondary | ICD-10-CM

## 2018-09-19 DIAGNOSIS — Z6830 Body mass index (BMI) 30.0-30.9, adult: Secondary | ICD-10-CM

## 2018-09-19 DIAGNOSIS — IMO0002 Reserved for concepts with insufficient information to code with codable children: Secondary | ICD-10-CM

## 2018-09-19 DIAGNOSIS — D638 Anemia in other chronic diseases classified elsewhere: Secondary | ICD-10-CM | POA: Diagnosis present

## 2018-09-19 DIAGNOSIS — Z881 Allergy status to other antibiotic agents status: Secondary | ICD-10-CM

## 2018-09-19 DIAGNOSIS — X58XXXD Exposure to other specified factors, subsequent encounter: Secondary | ICD-10-CM | POA: Diagnosis present

## 2018-09-19 DIAGNOSIS — R4701 Aphasia: Secondary | ICD-10-CM | POA: Diagnosis present

## 2018-09-19 DIAGNOSIS — F17210 Nicotine dependence, cigarettes, uncomplicated: Secondary | ICD-10-CM | POA: Diagnosis present

## 2018-09-19 DIAGNOSIS — Z9181 History of falling: Secondary | ICD-10-CM

## 2018-09-19 DIAGNOSIS — Z880 Allergy status to penicillin: Secondary | ICD-10-CM

## 2018-09-19 DIAGNOSIS — G9340 Encephalopathy, unspecified: Secondary | ICD-10-CM | POA: Diagnosis present

## 2018-09-19 DIAGNOSIS — E1165 Type 2 diabetes mellitus with hyperglycemia: Secondary | ICD-10-CM

## 2018-09-19 DIAGNOSIS — M79673 Pain in unspecified foot: Secondary | ICD-10-CM

## 2018-09-19 DIAGNOSIS — Z8349 Family history of other endocrine, nutritional and metabolic diseases: Secondary | ICD-10-CM

## 2018-09-19 DIAGNOSIS — F329 Major depressive disorder, single episode, unspecified: Secondary | ICD-10-CM | POA: Diagnosis present

## 2018-09-19 DIAGNOSIS — F419 Anxiety disorder, unspecified: Secondary | ICD-10-CM | POA: Diagnosis present

## 2018-09-19 DIAGNOSIS — G8929 Other chronic pain: Secondary | ICD-10-CM | POA: Diagnosis present

## 2018-09-19 DIAGNOSIS — E785 Hyperlipidemia, unspecified: Secondary | ICD-10-CM | POA: Diagnosis present

## 2018-09-19 DIAGNOSIS — R0602 Shortness of breath: Secondary | ICD-10-CM

## 2018-09-19 DIAGNOSIS — G92 Toxic encephalopathy: Secondary | ICD-10-CM | POA: Diagnosis not present

## 2018-09-19 DIAGNOSIS — G40909 Epilepsy, unspecified, not intractable, without status epilepticus: Secondary | ICD-10-CM | POA: Diagnosis present

## 2018-09-19 DIAGNOSIS — R404 Transient alteration of awareness: Secondary | ICD-10-CM

## 2018-09-19 DIAGNOSIS — D509 Iron deficiency anemia, unspecified: Secondary | ICD-10-CM | POA: Diagnosis present

## 2018-09-19 DIAGNOSIS — Z8669 Personal history of other diseases of the nervous system and sense organs: Secondary | ICD-10-CM | POA: Diagnosis present

## 2018-09-19 DIAGNOSIS — E663 Overweight: Secondary | ICD-10-CM | POA: Diagnosis present

## 2018-09-19 DIAGNOSIS — E876 Hypokalemia: Secondary | ICD-10-CM | POA: Diagnosis not present

## 2018-09-19 DIAGNOSIS — R945 Abnormal results of liver function studies: Secondary | ICD-10-CM | POA: Diagnosis present

## 2018-09-19 DIAGNOSIS — Z7951 Long term (current) use of inhaled steroids: Secondary | ICD-10-CM

## 2018-09-19 DIAGNOSIS — D693 Immune thrombocytopenic purpura: Secondary | ICD-10-CM | POA: Diagnosis present

## 2018-09-19 DIAGNOSIS — Z8673 Personal history of transient ischemic attack (TIA), and cerebral infarction without residual deficits: Secondary | ICD-10-CM

## 2018-09-19 DIAGNOSIS — E1142 Type 2 diabetes mellitus with diabetic polyneuropathy: Secondary | ICD-10-CM

## 2018-09-19 DIAGNOSIS — Z833 Family history of diabetes mellitus: Secondary | ICD-10-CM

## 2018-09-19 DIAGNOSIS — Z9081 Acquired absence of spleen: Secondary | ICD-10-CM

## 2018-09-19 DIAGNOSIS — D696 Thrombocytopenia, unspecified: Secondary | ICD-10-CM

## 2018-09-19 DIAGNOSIS — R7989 Other specified abnormal findings of blood chemistry: Secondary | ICD-10-CM | POA: Diagnosis present

## 2018-09-19 DIAGNOSIS — S93324D Dislocation of tarsometatarsal joint of right foot, subsequent encounter: Secondary | ICD-10-CM

## 2018-09-19 DIAGNOSIS — Z888 Allergy status to other drugs, medicaments and biological substances status: Secondary | ICD-10-CM

## 2018-09-19 DIAGNOSIS — J189 Pneumonia, unspecified organism: Secondary | ICD-10-CM | POA: Diagnosis present

## 2018-09-19 DIAGNOSIS — Z8249 Family history of ischemic heart disease and other diseases of the circulatory system: Secondary | ICD-10-CM

## 2018-09-19 DIAGNOSIS — T50915A Adverse effect of multiple unspecified drugs, medicaments and biological substances, initial encounter: Secondary | ICD-10-CM | POA: Diagnosis present

## 2018-09-19 HISTORY — DX: Pneumonia, unspecified organism: J18.9

## 2018-09-19 LAB — CBC WITH DIFFERENTIAL/PLATELET
Abs Immature Granulocytes: 0.22 10*3/uL — ABNORMAL HIGH (ref 0.00–0.07)
Basophils Absolute: 0.1 10*3/uL (ref 0.0–0.1)
Basophils Relative: 1 %
Eosinophils Absolute: 0 10*3/uL (ref 0.0–0.5)
Eosinophils Relative: 0 %
HCT: 32.7 % — ABNORMAL LOW (ref 36.0–46.0)
Hemoglobin: 9.1 g/dL — ABNORMAL LOW (ref 12.0–15.0)
Immature Granulocytes: 2 %
Lymphocytes Relative: 12 %
Lymphs Abs: 1.7 10*3/uL (ref 0.7–4.0)
MCH: 21.3 pg — ABNORMAL LOW (ref 26.0–34.0)
MCHC: 27.8 g/dL — ABNORMAL LOW (ref 30.0–36.0)
MCV: 76.4 fL — ABNORMAL LOW (ref 80.0–100.0)
Monocytes Absolute: 2.2 10*3/uL — ABNORMAL HIGH (ref 0.1–1.0)
Monocytes Relative: 16 %
Neutro Abs: 9.5 10*3/uL — ABNORMAL HIGH (ref 1.7–7.7)
Neutrophils Relative %: 69 %
PLATELETS: 150 10*3/uL (ref 150–400)
RBC: 4.28 MIL/uL (ref 3.87–5.11)
RDW: 26.5 % — AB (ref 11.5–15.5)
WBC: 13.8 10*3/uL — ABNORMAL HIGH (ref 4.0–10.5)
nRBC: 11 % — ABNORMAL HIGH (ref 0.0–0.2)

## 2018-09-19 LAB — COMPREHENSIVE METABOLIC PANEL
ALT: 593 U/L — ABNORMAL HIGH (ref 0–44)
AST: 407 U/L — ABNORMAL HIGH (ref 15–41)
Albumin: 3.3 g/dL — ABNORMAL LOW (ref 3.5–5.0)
Alkaline Phosphatase: 120 U/L (ref 38–126)
Anion gap: 11 (ref 5–15)
BUN: 20 mg/dL (ref 6–20)
CO2: 25 mmol/L (ref 22–32)
Calcium: 8.3 mg/dL — ABNORMAL LOW (ref 8.9–10.3)
Chloride: 103 mmol/L (ref 98–111)
Creatinine, Ser: 0.67 mg/dL (ref 0.44–1.00)
GFR calc Af Amer: >60 mL/min (ref >60)
GFR calc non Af Amer: >60 mL/min (ref >60)
Glucose, Bld: 109 mg/dL — ABNORMAL HIGH (ref 70–99)
Potassium: 3.7 mmol/L (ref 3.5–5.1)
Sodium: 139 mmol/L (ref 135–145)
Total Bilirubin: 1 mg/dL (ref 0.3–1.2)
Total Protein: 6.1 g/dL — ABNORMAL LOW (ref 6.5–8.1)

## 2018-09-19 LAB — ETHANOL: Alcohol, Ethyl (B): <10 mg/dL (ref <10)

## 2018-09-19 LAB — POCT I-STAT EG7
Acid-Base Excess: 2 mmol/L (ref 0.0–2.0)
Bicarbonate: 26.2 mmol/L (ref 20.0–28.0)
Calcium, Ion: 1.04 mmol/L — ABNORMAL LOW (ref 1.15–1.40)
HCT: 32 % — ABNORMAL LOW (ref 36.0–46.0)
Hemoglobin: 10.9 g/dL — ABNORMAL LOW (ref 12.0–15.0)
O2 Saturation: 97 %
POTASSIUM: 3.9 mmol/L (ref 3.5–5.1)
Sodium: 138 mmol/L (ref 135–145)
TCO2: 27 mmol/L (ref 22–32)
pCO2, Ven: 39.6 mmHg — ABNORMAL LOW (ref 44.0–60.0)
pH, Ven: 7.429 (ref 7.250–7.430)
pO2, Ven: 92 mmHg — ABNORMAL HIGH (ref 32.0–45.0)

## 2018-09-19 LAB — URINALYSIS, ROUTINE W REFLEX MICROSCOPIC
Bacteria, UA: NONE SEEN
Bilirubin Urine: NEGATIVE
Glucose, UA: NEGATIVE mg/dL
Ketones, ur: 80 mg/dL — AB
Leukocytes,Ua: NEGATIVE
Nitrite: NEGATIVE
Protein, ur: 30 mg/dL — AB
RBC / HPF: >50 RBC/hpf — ABNORMAL HIGH (ref 0–5)
Specific Gravity, Urine: >1.046 — ABNORMAL HIGH (ref 1.005–1.030)
pH: 7 (ref 5.0–8.0)

## 2018-09-19 LAB — CBG MONITORING, ED: Glucose-Capillary: 96 mg/dL (ref 70–99)

## 2018-09-19 LAB — I-STAT BETA HCG BLOOD, ED (MC, WL, AP ONLY): I-stat hCG, quantitative: <5 m[IU]/mL (ref <5)

## 2018-09-19 LAB — LACTIC ACID, PLASMA
Lactic Acid, Venous: 1 mmol/L (ref 0.5–1.9)
Lactic Acid, Venous: 0.9 mmol/L (ref 0.5–1.9)

## 2018-09-19 LAB — AMMONIA: Ammonia: 14 umol/L (ref 9–35)

## 2018-09-19 LAB — LIPASE, BLOOD: Lipase: 17 U/L (ref 11–51)

## 2018-09-19 MED ORDER — GADOBUTROL 1 MMOL/ML IV SOLN
7.5000 mL | Freq: Once | INTRAVENOUS | Status: AC | PRN
  Administered 2018-09-19: 7.5 mL via INTRAVENOUS

## 2018-09-19 MED ORDER — IOHEXOL 300 MG/ML  SOLN
100.0000 mL | Freq: Once | INTRAMUSCULAR | Status: AC | PRN
  Administered 2018-09-19: 100 mL via INTRAVENOUS

## 2018-09-19 MED ORDER — CIPROFLOXACIN IN D5W 400 MG/200ML IV SOLN: 400.0000 mg | Freq: Once | INTRAVENOUS | Status: DC

## 2018-09-19 MED ORDER — METRONIDAZOLE IN NACL 5-0.79 MG/ML-% IV SOLN: 500.0000 mg | Freq: Once | INTRAVENOUS | Status: DC

## 2018-09-19 MED ORDER — SODIUM CHLORIDE 0.9 % IV SOLN
100.0000 mg | Freq: Once | INTRAVENOUS | Status: AC
  Administered 2018-09-20: 100 mg via INTRAVENOUS
  Filled 2018-09-19 (×2): qty 100

## 2018-09-19 NOTE — ED Triage Notes (Signed)
Pt BIB [facility]. Pt has had increased confusion over the last week and her daughter found her on the floor today. Pt has also been complaining of some shortness of breath with exertion. Per EMS patient's grandson has had the flu recently but the patient has not been in contact with him. Pt is confused to place and time upon arrival to the ED.

## 2018-09-19 NOTE — ED Notes (Signed)
Patient transported to MRI 

## 2018-09-19 NOTE — ED Notes (Signed)
Patient transported to CT 

## 2018-09-19 NOTE — ED Provider Notes (Signed)
[facility] Provider Note   CSN: [id number] Arrival date & time: 09/19/18  [facility] [place]    History   Chief Complaint Chief Complaint  Patient presents with   Altered Mental Status    HPI Amber Faulkner is a 58 y.o. female.     58 yo F with a chief complaint of altered mental status.  Patient is unable to provide much history for me.  Repeats over and over again that it is been going on for a while.  Level 5 caveat altered mental status.  The history is provided by the patient and the EMS personnel.  Altered Mental Status  Associated symptoms: abdominal pain   Associated symptoms: no fever, no headaches, no nausea, no palpitations and no vomiting   Illness  Severity:  Moderate Onset quality:  Sudden Duration:  1 day Timing:  Constant Progression:  Worsening Chronicity:  New Associated symptoms: abdominal pain   Associated symptoms: no chest pain, no congestion, no fever, no headaches, no myalgias, no nausea, no rhinorrhea, no shortness of breath, no vomiting and no wheezing     Past Medical History:  Diagnosis Date   ADD (attention deficit disorder)    Anxiety    Chronic pain    Diabetes mellitus    HIV infection ([place])    Hyperlipidemia    Neuromuscular disorder ([place])     Patient Active Problem List   Diagnosis Date Noted   Symptomatic anemia 08/14/2018   Odynophagia 08/14/2018   Migraine headache 02/02/2018   Diabetes mellitus with proteinuria ([place]) 01/05/2017   Uncontrolled type 2 diabetes mellitus with diabetic polyneuropathy, with long-term current use of insulin ([place]) 10/27/2015   History of stroke 07/16/2015   COPD, moderate ([place]) 04/21/2015   Cervical spinal cord compression ([place]) 04/14/2015   PTSD (post-traumatic stress disorder) 02/04/2015   Multiple sclerosis exacerbation ([place]) 12/05/2014   Neuropathy 12/30/2011   EDEMA 09/03/2010   DYSPNEA 09/03/2010   Thrombocytopenia ([place]) 06/02/2010   KNEE  PAIN, LEFT 05/26/2009   SMOKER 04/28/2009   BACK PAIN, LUMBAR 01/28/2009   Depression 09/02/2008   INSOMNIA 09/02/2008   Hyperlipidemia 02/21/2008   Essential hypertension 02/21/2008    Past Surgical History:  Procedure Laterality Date   ABDOMINAL HYSTERECTOMY  1991   complete   CARPAL TUNNEL RELEASE  1993-1994   both wrist   CERVICAL DISCECTOMY  03/25/2017   Dr. Cyndia Diver, [place]  09/2016   TARSAL TUNNEL RELEASE  1993   left ankle   TUMOR REMOVAL  1993   left thigh     OB History   No obstetric history on file.      Home Medications    Prior to Admission medications   Medication Sig Start Date End Date Taking? Authorizing Provider  ADVAIR DISKUS 250-50 MCG/DOSE AEPB Inhale 1 puff into the lungs 2 times daily. 05/29/18   Hali Marry, MD  albuterol (PROVENTIL HFA;VENTOLIN HFA) 108 (90 Base) MCG/ACT inhaler Inhale 2 puffs into the lungs every 6 (six) hours as needed for wheezing or shortness of breath. 08/27/18   Hali Marry, MD  AMBULATORY NON FORMULARY MEDICATION Rolling Walker use as needed.  Disp 1 G35 08/13/18   Gregor Hams, MD  carisoprodol (SOMA) 350 MG tablet Take 350 mg by mouth 3 (three) times daily.    Lawrence Marseilles, MD  DULoxetine (CYMBALTA) 30 MG capsule Take 30 mg  by mouth 2 (two) times daily. 04/26/18   [provider]  EASY TOUCH PEN NEEDLES 32G X 6 MM MISC USE AS DIRECTED 09/26/14   Hali Marry, MD  ferrous sulfate 325 (65 FE) MG tablet Take 1 tablet (325 mg total) by mouth daily. 08/15/18 08/15/19  Radene Gunning, NP  gabapentin (NEURONTIN) 300 MG capsule Take 1 capsule (300 mg total) by mouth 4 (four) times daily. 06/20/18   Hali Marry, MD  Insulin Detemir (LEVEMIR FLEXTOUCH) 100 UNIT/ML Pen Inject 20 Units into the skin 2 (two) times daily. 08/15/18   Black, Lezlie Octave, NP  insulin lispro (HUMALOG KWIKPEN) 100 UNIT/ML KwikPen Inject  0.15 mLs (15 Units total) into the skin 3 (three) times daily. 05/29/18   Hali Marry, MD  Lacosamide (VIMPAT) 100 MG TABS Take 100 mg by mouth 2 (two) times daily.  12/25/15   [provider]  losartan (COZAAR) 25 MG tablet Take 1 tablet (25 mg total) by mouth daily. 09/06/18   Hali Marry, MD  omeprazole (PRILOSEC) 40 MG capsule Take 1 capsule (40 mg total) by mouth daily. 03/13/18   Hali Marry, MD  ondansetron (ZOFRAN-ODT) 8 MG disintegrating tablet Take 1 tablet (8 mg total) by mouth every 8 (eight) hours as needed for nausea or vomiting. 08/15/18   Black, Lezlie Octave, NP  ONE TOUCH ULTRA TEST test strip USE TO CHECK BLOOD SUGAR THREE TIMES A DAY 04/23/18   Hali Marry, MD  Oxycodone HCl 20 MG TABS Take 20 mg by mouth every 4 (four) hours as needed (pain).     [provider]  PHARMACIST CHOICE LANCETS MISC USE TO CHECK BLOOD SUGAR THREE TIMES A DAY 04/23/18   Hali Marry, MD  potassium chloride (K-DUR,KLOR-CON) 10 MEQ tablet Take 1 tablet (10 mEq total) by mouth once for 1 dose. 09/04/18 09/04/18  Hali Marry, MD  QUEtiapine (SEROQUEL) 200 MG tablet Take 1 tablet (200 mg total) by mouth at bedtime. 05/29/18   Hali Marry, MD  rosuvastatin (CRESTOR) 20 MG tablet Take 1 tablet (20 mg total) by mouth daily. 09/06/18   Hali Marry, MD  SPIRIVA HANDIHALER 18 MCG inhalation capsule Place 1 capsule into inhaler and inhale daily. 05/29/18   Hali Marry, MD  sucralfate (CARAFATE) 1 g tablet Take 1 tablet (1 g total) by mouth 4 (four) times daily -  with meals and at bedtime. 05/29/18   Hali Marry, MD  triamcinolone cream (KENALOG) 0.1 % Apply 1 application topically 2 (two) times daily. 08/20/18   Gregor Hams, MD  venlafaxine XR (EFFEXOR-XR) 75 MG 24 hr capsule Take 1 capsule (75 mg total) by mouth daily with breakfast. 09/06/18   Hali Marry, MD    Family History Family History    Problem Relation Age of Onset   Cancer Sister        hepatic and pancreatic   Cancer Mother        brain and lung   COPD Mother    Hyperlipidemia Father    Hypertension Father    Diabetes Father    Heart disease Father    Cancer Maternal Aunt        breast    Social History Social History   Tobacco Use   Smoking status: Current Every Day Smoker    Packs/day: 1.50    Types: Cigarettes   Smokeless tobacco: Never Used  Substance Use Topics   Alcohol use:  No   Drug use: No     Allergies   Cephalosporins; Vancomycin; Penicillins; Ampicillin; Carisoprodol; Lisinopril; and Penicillin g   Review of Systems Review of Systems  Constitutional: Positive for activity change. Negative for chills and fever.  HENT: Negative for congestion and rhinorrhea.   Eyes: Negative for redness and visual disturbance.  Respiratory: Negative for shortness of breath and wheezing.   Cardiovascular: Negative for chest pain and palpitations.  Gastrointestinal: Positive for abdominal pain. Negative for nausea and vomiting.  Genitourinary: Negative for dysuria and urgency.  Musculoskeletal: Negative for arthralgias and myalgias.  Skin: Negative for pallor and wound.  Neurological: Negative for dizziness and headaches.     Physical Exam Updated Vital Signs BP (!) 143/83    Pulse 76    Temp 98.6 F (37 C) (Oral)    Resp 19    Ht 5\' 4"  (1.626 m)    Wt 80.7 kg    SpO2 92%    BMI 30.55 kg/m   Physical Exam Vitals signs and nursing note reviewed.  Constitutional:      General: She is not in acute distress.    Appearance: She is well-developed. She is not diaphoretic.  HENT:     Head: Normocephalic and atraumatic.  Eyes:     Pupils: Pupils are equal, round, and reactive to light.  Neck:     Musculoskeletal: Normal range of motion and neck supple.  Cardiovascular:     Rate and Rhythm: Normal rate and regular rhythm.     Heart sounds: No murmur. No friction rub. No gallop.    Pulmonary:     Effort: Pulmonary effort is normal.     Breath sounds: No wheezing or rales.  Abdominal:     General: There is no distension.     Palpations: Abdomen is soft.     Tenderness: There is no abdominal tenderness.  Musculoskeletal:        General: No tenderness.  Skin:    General: Skin is warm and dry.  Neurological:     Mental Status: She is alert and oriented to person, place, and time.     Comments: Moving all 4 extremities spontaneously, follows commands.  Repetitive speech.  Psychiatric:        Behavior: Behavior normal.      ED Treatments / Results  Labs (all labs ordered are listed, but only abnormal results are displayed) Labs Reviewed  COMPREHENSIVE METABOLIC PANEL - Abnormal; Notable for the following components:      Result Value   Glucose, Bld 109 (*)    Calcium 8.3 (*)    Total Protein 6.1 (*)    Albumin 3.3 (*)    AST 407 (*)    ALT 593 (*)    All other components within normal limits  CBC WITH DIFFERENTIAL/PLATELET - Abnormal; Notable for the following components:   WBC 13.8 (*)    Hemoglobin 9.1 (*)    HCT 32.7 (*)    MCV 76.4 (*)    MCH 21.3 (*)    MCHC 27.8 (*)    RDW 26.5 (*)    nRBC 11.0 (*)    Neutro Abs 9.5 (*)    Monocytes Absolute 2.2 (*)    Abs Immature Granulocytes 0.22 (*)    All other components within normal limits  URINALYSIS, ROUTINE W REFLEX MICROSCOPIC - Abnormal; Notable for the following components:   Specific Gravity, Urine >1.046 (*)    Hgb urine dipstick LARGE (*)    Ketones,  ur 80 (*)    Protein, ur 30 (*)    RBC / HPF >50 (*)    All other components within normal limits  POCT I-STAT EG7 - Abnormal; Notable for the following components:   pCO2, Ven 39.6 (*)    pO2, Ven 92.0 (*)    Calcium, Ion 1.04 (*)    HCT 32.0 (*)    Hemoglobin 10.9 (*)    All other components within normal limits  URINE CULTURE  AMMONIA  ETHANOL  LIPASE, BLOOD  LACTIC ACID, PLASMA  LACTIC ACID, PLASMA  PATHOLOGIST SMEAR REVIEW   HEPATITIS PANEL, ACUTE  CBG MONITORING, ED  I-STAT VENOUS BLOOD GAS, ED  I-STAT BETA HCG BLOOD, ED ([facility], [facility], [facility] ONLY)    EKG EKG Interpretation  Date/Time:  Wednesday September 19 2018 19:05:29 EDT Ventricular Rate:  76 PR Interval:    QRS Duration: 104 QT Interval:  387 QTC Calculation: 436 R Axis:   36 Text Interpretation:  Sinus rhythm No old tracing to compare Confirmed by Deno Etienne [phone]) on 09/19/2018 7:29:14 PM   Radiology Ct Head Wo Contrast  Result Date: 09/19/2018 CLINICAL DATA:  58 year old female with increased confusion over the past week, found down today. EXAM: CT HEAD WITHOUT CONTRAST TECHNIQUE: Contiguous axial images were obtained from the base of the skull through the vertex without intravenous contrast. COMPARISON:  PET-CT 01/06/2015. FINDINGS: Brain: Normal cerebral volume. No midline shift, ventriculomegaly, mass effect, evidence of mass lesion, intracranial hemorrhage or evidence of cortically based acute infarction. Possible small chronic infarct in the right cerebellar hemisphere on series 3, image 7. Mild scattered cerebral white matter hypodensity. Vascular: Calcified atherosclerosis at the skull base. No suspicious intracranial vascular hyperdensity. Skull: No acute osseous abnormality identified. Sinuses/Orbits: Visualized paranasal sinuses and mastoids are well pneumatized. Minor mucosal thickening in the left maxillary sinus. Other: Negative orbits. Visualized scalp soft tissues are within normal limits. IMPRESSION: 1. No acute intracranial abnormality. 2. Possible small chronic right cerebellar infarct. Mild nonspecific white matter changes most commonly due to small vessel disease. Electronically Signed   By: Genevie Ann M.D.   On: 09/19/2018 19:48   Ct Abdomen Pelvis W Contrast  Result Date: 09/19/2018 CLINICAL DATA:  Right upper quadrant pain. EXAM: CT ABDOMEN AND PELVIS WITH CONTRAST TECHNIQUE: Multidetector CT imaging of the abdomen and pelvis was performed  using the standard protocol following bolus administration of intravenous contrast. CONTRAST:  152mL OMNIPAQUE IOHEXOL 300 MG/ML  SOLN COMPARISON:  Head CT 01/06/2015 FINDINGS: There is residual contrast material demonstrated throughout the colon. Streak artifact arising from this dense contrast material limits the examination in some areas. Lower chest: Emphysematous changes and linear fibrosis or scarring in the lung bases. Suggestion of patchy airspace infiltrates in the left lung base possibly due to pneumonia. Motion artifact limits evaluation. Hepatobiliary: No focal liver abnormality is seen. No gallstones, gallbladder wall thickening, or biliary dilatation. Pancreas: Unremarkable. No pancreatic ductal dilatation or surrounding inflammatory changes. Spleen: Surgically absent Adrenals/Urinary Tract: Limited visualization of the kidneys. No obvious abnormalities identified. Bladder wall is not thickened. No bladder filling defects. Residual contrast material in the bladder. Stomach/Bowel: Stomach, small bowel, and colon are not abnormally distended. Limited visualization particularly of the colon due to streak artifact. No obvious inflammatory changes. Appendix is not identified. Vascular/Lymphatic: Limited visualization of the abdominal aorta due to streak artifact but no aneurysm is suggested. Reproductive: Uterus is surgically absent. No obvious pelvic mass although visualization of the pelvis is limited due to streak artifact.  Other: No free air or free fluid in the visualized abdomen. Musculoskeletal: No acute or significant osseous findings. IMPRESSION: 1. Residual contrast material throughout the colon limits examination. 2. No acute process demonstrated in the abdomen or pelvis. No evidence of bowel obstruction or inflammation. 3. Suggestion of patchy airspace infiltrates in the left lung base possibly indicating pneumonia. Electronically Signed   By: Lucienne Capers M.D.   On: 09/19/2018 22:17   Dg  Chest Port 1 View  Result Date: 09/19/2018 CLINICAL DATA:  58 year old female found down with confusion. EXAM: PORTABLE CHEST 1 VIEW COMPARISON:  Chest radiographs 08/13/2018 and earlier. FINDINGS: Portable AP semi upright view at 1915 hours. Lung volumes and mediastinal contours are within normal limits. Subtotal clearing of left basilar opacity since 08/13/2018. No areas of worsening ventilation. No pneumothorax or pulmonary edema. Hyperdense material in the left upper quadrant may be retained barium at the splenic flexure, uncertain. Extensive prior cervical spine surgery. No acute osseous abnormality identified. IMPRESSION: Regressed but not fully resolved mild left lung base opacity since February. No new cardiopulmonary abnormality. Electronically Signed   By: Genevie Ann M.D.   On: 09/19/2018 19:33    Procedures Procedures (including critical care time)  Medications Ordered in ED Medications  doxycycline (VIBRAMYCIN) 100 mg in sodium chloride 0.9 % 250 mL IVPB (has no administration in time range)  iohexol (OMNIPAQUE) 300 MG/ML solution 100 mL (100 mLs Intravenous Contrast Given 09/19/18 2128)  gadobutrol (GADAVIST) 1 MMOL/ML injection 7.5 mL (7.5 mLs Intravenous Contrast Given 09/19/18 2356)     Initial Impression / Assessment and Plan / ED Course  I have reviewed the triage vital signs and the nursing notes.  Pertinent labs & imaging results that were available during my care of the patient were reviewed by me and considered in my medical decision making (see chart for details).        58 yo F with a chief complaint of altered mental status.  Patient appears to have some abdominal tenderness on my exam worse in the epigastrium and right upper quadrant.  She will only tell me that this is been going on for a while but does not further elucidate.  She had a CT scan that was done about a month ago that was negative for the abdomen pelvis.  We will obtain laboratory evaluation.  She does have  a history of ITP as well.  Give a bolus of fluids reassess.'  Patient looks like she feels better on reassessment though still has a significant aphasia which is more apparent now.  She can only say okay or no and is having trouble saying any specific words.  I discussed this with Dr. Malen Gauze, neurology recommended obtaining an MRI of the brain.  I obtained a CT scan of the abdomen pelvis with contrast as the patient had significant epigastric and right upper quadrant abdominal tenderness though she recently had this image done in the past few months.  LFTs were also elevated.  CT scan without acute pathology found in the abdomen though it does look like she may have pneumonia.  She has been coughing quite a bit since she was here.  I initially started her on Cipro and Flagyl for intra-abdominal pathology.  But with no intra-abdominal pathology being found I discussed the case with the pharmacist and realized that the patient had not yet received the antibiotics.  I will cancel the Cipro and Flagyl and started on doxy.  CRITICAL CARE Performed by: Cecilio Asper  Total critical care time: 35 minutes  Critical care time was exclusive of separately billable procedures and treating other patients.  Critical care was necessary to treat or prevent imminent or life-threatening deterioration.  Critical care was time spent personally by me on the following activities: development of treatment plan with patient and/or surrogate as well as nursing, discussions with consultants, evaluation of patient's response to treatment, examination of patient, obtaining history from patient or surrogate, ordering and performing treatments and interventions, ordering and review of laboratory studies, ordering and review of radiographic studies, pulse oximetry and re-evaluation of patient's condition.  The patients results and plan were reviewed and discussed.   Any x-rays performed were independently reviewed by  myself.   Differential diagnosis were considered with the presenting HPI.  Medications  doxycycline (VIBRAMYCIN) 100 mg in sodium chloride 0.9 % 250 mL IVPB (has no administration in time range)  iohexol (OMNIPAQUE) 300 MG/ML solution 100 mL (100 mLs Intravenous Contrast Given 09/19/18 2128)  gadobutrol (GADAVIST) 1 MMOL/ML injection 7.5 mL (7.5 mLs Intravenous Contrast Given 09/19/18 2356)    Vitals:   09/19/18 2200 09/19/18 2215 09/19/18 2230 09/19/18 2245  BP: (!) 153/81 (!) 142/78 139/77 (!) 143/83  Pulse: 80 79 72 76  Resp: 17 (!) 22 16 19   Temp:      TempSrc:      SpO2: 95% 93% 93% 92%  Weight:      Height:        Final diagnoses:  Community acquired pneumonia of right lower lobe of lung ([place])  Transient alteration of awareness    Admission/ observation were discussed with the admitting physician, patient and/or family and they are comfortable with the plan.   Final Clinical Impressions(s) / ED Diagnoses   Final diagnoses:  Community acquired pneumonia of right lower lobe of lung ([place])  Transient alteration of awareness    ED Discharge Orders    None       Deno Etienne, DO 09/19/18 2357

## 2018-09-19 NOTE — ED Notes (Signed)
Pt's CBG result was 96. Informed Elmyra Ricks - RN.

## 2018-09-20 ENCOUNTER — Observation Stay (HOSPITAL_COMMUNITY): Payer: Medicare Other

## 2018-09-20 ENCOUNTER — Encounter (HOSPITAL_COMMUNITY): Payer: Self-pay | Admitting: Internal Medicine

## 2018-09-20 DIAGNOSIS — G9340 Encephalopathy, unspecified: Secondary | ICD-10-CM | POA: Diagnosis not present

## 2018-09-20 DIAGNOSIS — F988 Other specified behavioral and emotional disorders with onset usually occurring in childhood and adolescence: Secondary | ICD-10-CM | POA: Diagnosis present

## 2018-09-20 DIAGNOSIS — I10 Essential (primary) hypertension: Secondary | ICD-10-CM | POA: Diagnosis present

## 2018-09-20 DIAGNOSIS — R945 Abnormal results of liver function studies: Secondary | ICD-10-CM | POA: Diagnosis present

## 2018-09-20 DIAGNOSIS — G35 Multiple sclerosis: Secondary | ICD-10-CM | POA: Diagnosis present

## 2018-09-20 DIAGNOSIS — E1142 Type 2 diabetes mellitus with diabetic polyneuropathy: Secondary | ICD-10-CM | POA: Diagnosis present

## 2018-09-20 DIAGNOSIS — S93324D Dislocation of tarsometatarsal joint of right foot, subsequent encounter: Secondary | ICD-10-CM | POA: Diagnosis not present

## 2018-09-20 DIAGNOSIS — G8929 Other chronic pain: Secondary | ICD-10-CM | POA: Diagnosis present

## 2018-09-20 DIAGNOSIS — F419 Anxiety disorder, unspecified: Secondary | ICD-10-CM | POA: Diagnosis present

## 2018-09-20 DIAGNOSIS — D509 Iron deficiency anemia, unspecified: Secondary | ICD-10-CM | POA: Diagnosis present

## 2018-09-20 DIAGNOSIS — E663 Overweight: Secondary | ICD-10-CM | POA: Diagnosis present

## 2018-09-20 DIAGNOSIS — R109 Unspecified abdominal pain: Secondary | ICD-10-CM | POA: Diagnosis not present

## 2018-09-20 DIAGNOSIS — D638 Anemia in other chronic diseases classified elsewhere: Secondary | ICD-10-CM | POA: Diagnosis present

## 2018-09-20 DIAGNOSIS — J181 Lobar pneumonia, unspecified organism: Secondary | ICD-10-CM | POA: Diagnosis not present

## 2018-09-20 DIAGNOSIS — Z8669 Personal history of other diseases of the nervous system and sense organs: Secondary | ICD-10-CM | POA: Diagnosis present

## 2018-09-20 DIAGNOSIS — R4701 Aphasia: Secondary | ICD-10-CM | POA: Diagnosis present

## 2018-09-20 DIAGNOSIS — E876 Hypokalemia: Secondary | ICD-10-CM | POA: Diagnosis not present

## 2018-09-20 DIAGNOSIS — R404 Transient alteration of awareness: Secondary | ICD-10-CM | POA: Diagnosis present

## 2018-09-20 DIAGNOSIS — R7989 Other specified abnormal findings of blood chemistry: Secondary | ICD-10-CM | POA: Diagnosis not present

## 2018-09-20 DIAGNOSIS — G40909 Epilepsy, unspecified, not intractable, without status epilepticus: Secondary | ICD-10-CM | POA: Diagnosis present

## 2018-09-20 DIAGNOSIS — Y92009 Unspecified place in unspecified non-institutional (private) residence as the place of occurrence of the external cause: Secondary | ICD-10-CM | POA: Diagnosis not present

## 2018-09-20 DIAGNOSIS — E785 Hyperlipidemia, unspecified: Secondary | ICD-10-CM | POA: Diagnosis present

## 2018-09-20 DIAGNOSIS — J440 Chronic obstructive pulmonary disease with acute lower respiratory infection: Secondary | ICD-10-CM | POA: Diagnosis present

## 2018-09-20 DIAGNOSIS — J189 Pneumonia, unspecified organism: Secondary | ICD-10-CM | POA: Diagnosis present

## 2018-09-20 DIAGNOSIS — X58XXXD Exposure to other specified factors, subsequent encounter: Secondary | ICD-10-CM | POA: Diagnosis present

## 2018-09-20 DIAGNOSIS — D696 Thrombocytopenia, unspecified: Secondary | ICD-10-CM | POA: Diagnosis not present

## 2018-09-20 DIAGNOSIS — G92 Toxic encephalopathy: Secondary | ICD-10-CM | POA: Diagnosis present

## 2018-09-20 DIAGNOSIS — F329 Major depressive disorder, single episode, unspecified: Secondary | ICD-10-CM | POA: Diagnosis present

## 2018-09-20 DIAGNOSIS — T50915A Adverse effect of multiple unspecified drugs, medicaments and biological substances, initial encounter: Secondary | ICD-10-CM | POA: Diagnosis present

## 2018-09-20 DIAGNOSIS — F17210 Nicotine dependence, cigarettes, uncomplicated: Secondary | ICD-10-CM | POA: Diagnosis present

## 2018-09-20 DIAGNOSIS — D693 Immune thrombocytopenic purpura: Secondary | ICD-10-CM | POA: Diagnosis present

## 2018-09-20 LAB — CBC WITH DIFFERENTIAL/PLATELET
Abs Immature Granulocytes: 0.2 10*3/uL — ABNORMAL HIGH (ref 0.00–0.07)
Basophils Absolute: 0.1 10*3/uL (ref 0.0–0.1)
Basophils Relative: 1 %
Eosinophils Absolute: 0.2 10*3/uL (ref 0.0–0.5)
Eosinophils Relative: 1 %
HCT: 30.9 % — ABNORMAL LOW (ref 36.0–46.0)
Hemoglobin: 8.8 g/dL — ABNORMAL LOW (ref 12.0–15.0)
Immature Granulocytes: 1 %
Lymphocytes Relative: 13 %
Lymphs Abs: 2 10*3/uL (ref 0.7–4.0)
MCH: 21.6 pg — ABNORMAL LOW (ref 26.0–34.0)
MCHC: 28.5 g/dL — ABNORMAL LOW (ref 30.0–36.0)
MCV: 75.9 fL — ABNORMAL LOW (ref 80.0–100.0)
Monocytes Absolute: 2.8 10*3/uL — ABNORMAL HIGH (ref 0.1–1.0)
Monocytes Relative: 20 %
Neutro Abs: 9.3 10*3/uL — ABNORMAL HIGH (ref 1.7–7.7)
Neutrophils Relative %: 64 %
Platelets: UNDETERMINED 10*3/uL (ref 150–400)
RBC: 4.07 MIL/uL (ref 3.87–5.11)
RDW: 26.7 % — AB (ref 11.5–15.5)
WBC: 14.5 10*3/uL — ABNORMAL HIGH (ref 4.0–10.5)
nRBC: 10.5 % — ABNORMAL HIGH (ref 0.0–0.2)

## 2018-09-20 LAB — HEPATIC FUNCTION PANEL
ALT: 564 U/L — ABNORMAL HIGH (ref 0–44)
AST: 323 U/L — ABNORMAL HIGH (ref 15–41)
Albumin: 3.2 g/dL — ABNORMAL LOW (ref 3.5–5.0)
Alkaline Phosphatase: 118 U/L (ref 38–126)
Bilirubin, Direct: 0.3 mg/dL — ABNORMAL HIGH (ref 0.0–0.2)
Indirect Bilirubin: 0.9 mg/dL (ref 0.3–0.9)
Total Bilirubin: 1.2 mg/dL (ref 0.3–1.2)
Total Protein: 6.3 g/dL — ABNORMAL LOW (ref 6.5–8.1)

## 2018-09-20 LAB — BASIC METABOLIC PANEL
ANION GAP: 14 (ref 5–15)
BUN: 17 mg/dL (ref 6–20)
CHLORIDE: 101 mmol/L (ref 98–111)
CO2: 24 mmol/L (ref 22–32)
Calcium: 8.5 mg/dL — ABNORMAL LOW (ref 8.9–10.3)
Creatinine, Ser: 0.75 mg/dL (ref 0.44–1.00)
GFR calc Af Amer: >60 mL/min (ref >60)
GFR calc non Af Amer: >60 mL/min (ref >60)
Glucose, Bld: 105 mg/dL — ABNORMAL HIGH (ref 70–99)
POTASSIUM: 3.8 mmol/L (ref 3.5–5.1)
Sodium: 139 mmol/L (ref 135–145)

## 2018-09-20 LAB — GLUCOSE, CAPILLARY
GLUCOSE-CAPILLARY: 110 mg/dL — AB (ref 70–99)
Glucose-Capillary: 103 mg/dL — ABNORMAL HIGH (ref 70–99)
Glucose-Capillary: 106 mg/dL — ABNORMAL HIGH (ref 70–99)
Glucose-Capillary: 112 mg/dL — ABNORMAL HIGH (ref 70–99)
Glucose-Capillary: 115 mg/dL — ABNORMAL HIGH (ref 70–99)

## 2018-09-20 LAB — ACETAMINOPHEN LEVEL: Acetaminophen (Tylenol), Serum: <10 ug/mL — ABNORMAL LOW (ref 10–30)

## 2018-09-20 LAB — PATHOLOGIST SMEAR REVIEW

## 2018-09-20 LAB — MRSA PCR SCREENING: MRSA BY PCR: NEGATIVE

## 2018-09-20 MED ORDER — CIPROFLOXACIN IN D5W 400 MG/200ML IV SOLN
400.0000 mg | Freq: Two times a day (BID) | INTRAVENOUS | Status: DC
  Administered 2018-09-20 – 2018-09-21 (×4): 400 mg via INTRAVENOUS
  Filled 2018-09-20 (×6): qty 200

## 2018-09-20 MED ORDER — IPRATROPIUM-ALBUTEROL 0.5-2.5 (3) MG/3ML IN SOLN: 3.0000 mL | RESPIRATORY_TRACT | Status: DC | PRN

## 2018-09-20 MED ORDER — INSULIN ASPART 100 UNIT/ML ~~LOC~~ SOLN
0.0000 [IU] | SUBCUTANEOUS | Status: DC
  Administered 2018-09-21 (×2): 1 [IU] via SUBCUTANEOUS
  Administered 2018-09-21: 2 [IU] via SUBCUTANEOUS

## 2018-09-20 MED ORDER — ONDANSETRON HCL 4 MG/2ML IJ SOLN: 4.0000 mg | Freq: Four times a day (QID) | INTRAMUSCULAR | Status: DC | PRN

## 2018-09-20 MED ORDER — HYDRALAZINE HCL 20 MG/ML IJ SOLN: 5.0000 mg | INTRAMUSCULAR | Status: DC | PRN

## 2018-09-20 MED ORDER — ONDANSETRON HCL 4 MG PO TABS: 4.0000 mg | ORAL_TABLET | Freq: Four times a day (QID) | ORAL | Status: DC | PRN

## 2018-09-20 MED ORDER — ACETAMINOPHEN 325 MG PO TABS: 650.0000 mg | ORAL_TABLET | Freq: Four times a day (QID) | ORAL | Status: DC | PRN

## 2018-09-20 MED ORDER — SODIUM CHLORIDE 0.9 % IV SOLN
100.0000 mg | Freq: Two times a day (BID) | INTRAVENOUS | Status: DC
  Administered 2018-09-20 – 2018-09-21 (×3): 100 mg via INTRAVENOUS
  Filled 2018-09-20 (×5): qty 10

## 2018-09-20 MED ORDER — BUDESONIDE 0.25 MG/2ML IN SUSP
0.2500 mg | Freq: Two times a day (BID) | RESPIRATORY_TRACT | Status: DC
  Administered 2018-09-20 – 2018-09-26 (×12): 0.25 mg via RESPIRATORY_TRACT
  Filled 2018-09-20 (×12): qty 2

## 2018-09-20 MED ORDER — ACETAMINOPHEN 650 MG RE SUPP: 650.0000 mg | Freq: Four times a day (QID) | RECTAL | Status: DC | PRN

## 2018-09-20 MED ORDER — IPRATROPIUM-ALBUTEROL 0.5-2.5 (3) MG/3ML IN SOLN
3.0000 mL | RESPIRATORY_TRACT | Status: DC
  Administered 2018-09-20: 3 mL via RESPIRATORY_TRACT
  Filled 2018-09-20: qty 3

## 2018-09-20 MED ORDER — IPRATROPIUM-ALBUTEROL 0.5-2.5 (3) MG/3ML IN SOLN
3.0000 mL | Freq: Four times a day (QID) | RESPIRATORY_TRACT | Status: DC
  Administered 2018-09-21 (×4): 3 mL via RESPIRATORY_TRACT
  Filled 2018-09-20 (×4): qty 3

## 2018-09-20 MED ORDER — METRONIDAZOLE IN NACL 5-0.79 MG/ML-% IV SOLN
500.0000 mg | Freq: Three times a day (TID) | INTRAVENOUS | Status: DC
  Administered 2018-09-20 – 2018-09-22 (×7): 500 mg via INTRAVENOUS
  Filled 2018-09-20 (×7): qty 100

## 2018-09-20 NOTE — ED Notes (Signed)
ED TO INPATIENT HANDOFF REPORT  ED Nurse Name and Phone #:  [place] [phone]  S Name/Age/Gender Amber Faulkner 58 y.o. female Room/Bed: 036C/036C  Code Status   Code Status: Prior  Home/SNF/Other Home Patient oriented to: self Is this baseline? No   Triage Complete: Triage complete  Chief Complaint Lethargic; Confusion  Triage Note Pt BIB [facility]. Pt has had increased confusion over the last week and her daughter found her on the floor today. Pt has also been complaining of some shortness of breath with exertion. Per EMS patient's grandson has had the flu recently but the patient has not been in contact with him. Pt is confused to place and time upon arrival to the ED.    Allergies Allergies  Allergen Reactions  . Cephalosporins Anaphylaxis  . Vancomycin Anaphylaxis  . Penicillins Hives and Rash  . Ampicillin Other (See Comments)    Other reaction(s): anaphylaxis  . Carisoprodol Other (See Comments)    Other reaction(s): seizures  . Lisinopril Other (See Comments)    Nausea,dizziness,headache  . Penicillin G Other (See Comments)    Other reaction(s): anaphylaxis    Level of Care/Admitting Diagnosis ED Disposition    ED Disposition Condition Comment   Admit  Hospital Area: [facility] [[id number]]  Level of Care: Progressive [102]  I expect the patient will be discharged within 24 hours: No (not a candidate for 5C-Observation unit)  Diagnosis: Acute encephalopathy [454098]  Admitting Physician: Rise Patience [phone]  Attending Physician: Rise Patience [[id number]]  PT Class (Do Not Modify): Observation [104]  PT Acc Code (Do Not Modify): Observation [10022]       B Medical/Surgery History Past Medical History:  Diagnosis Date  . ADD (attention deficit disorder)   . Anxiety   . Chronic pain   . Diabetes mellitus   . HIV infection ([place])   . Hyperlipidemia   . Neuromuscular disorder [facility])    Past Surgical History:  Procedure Laterality  Date  . ABDOMINAL HYSTERECTOMY  1991   complete  . CARPAL TUNNEL RELEASE  1993-1994   both wrist  . CERVICAL DISCECTOMY  03/25/2017   Dr. Cyndia Diver, [place]  . CESAREAN SECTION  1980  . HERNIA REPAIR  1976  . SPLENECTOMY  09/2016  . TARSAL TUNNEL RELEASE  1993   left ankle  . TUMOR REMOVAL  1993   left thigh     A IV Location/Drains/Wounds Patient Lines/Drains/Airways Status   Active Line/Drains/Airways    Name:   Placement date:   Placement time:   Site:   Days:   Peripheral IV 09/19/18 Right Forearm   09/19/18    1953    Forearm   1          Intake/Output Last 24 hours  Intake/Output Summary (Last 24 hours) at 09/20/2018 0103 Last data filed at 09/19/2018 2229 Gross per 24 hour  Intake -  Output 350 ml  Net -350 ml    Labs/Imaging Results for orders placed or performed during the hospital encounter of 09/19/18 (from the past 48 hour(s))  CBG monitoring, ED     Status: None   Collection Time: 09/19/18  6:44 PM  Result Value Ref Range   Glucose-Capillary 96 70 - 99 mg/dL   Comment 1 Notify RN    Comment 2 Document in Chart   Ammonia     Status: None   Collection Time: 09/19/18  7:49 PM  Result Value Ref Range   Ammonia 14  9 - 35 umol/L    Comment: Performed at [facility], [place] [address], [place], [place] [address]  Comprehensive metabolic panel     Status: Abnormal   Collection Time: 09/19/18  7:49 PM  Result Value Ref Range   Sodium 139 135 - 145 mmol/L   Potassium 3.7 3.5 - 5.1 mmol/L   Chloride 103 98 - 111 mmol/L   CO2 25 22 - 32 mmol/L   Glucose, Bld 109 (H) 70 - 99 mg/dL   BUN 20 6 - 20 mg/dL   Creatinine, Ser 0.67 0.44 - 1.00 mg/dL   Calcium 8.3 (L) 8.9 - 10.3 mg/dL   Total Protein 6.1 (L) 6.5 - 8.1 g/dL   Albumin 3.3 (L) 3.5 - 5.0 g/dL   AST 407 (H) 15 - 41 U/L   ALT 593 (H) 0 - 44 U/L   Alkaline Phosphatase 120 38 - 126 U/L   Total Bilirubin 1.0 0.3 - 1.2 mg/dL   GFR calc non Af Amer >60 >60 mL/min   GFR calc Af Amer >60 >60  mL/min   Anion gap 11 5 - 15    Comment: Performed at [facility], [place] [address], [place], [place] [address]  Ethanol     Status: None   Collection Time: 09/19/18  7:49 PM  Result Value Ref Range   Alcohol, Ethyl (B) <10 <10 mg/dL    Comment: (NOTE) Lowest detectable limit for serum alcohol is 10 mg/dL. For medical purposes only. Performed at [facility], [place] [address], [place], [place] [address]   CBC WITH DIFFERENTIAL     Status: Abnormal   Collection Time: 09/19/18  7:49 PM  Result Value Ref Range   WBC 13.8 (H) 4.0 - 10.5 K/uL   RBC 4.28 3.87 - 5.11 MIL/uL   Hemoglobin 9.1 (L) 12.0 - 15.0 g/dL    Comment: REPEATED TO VERIFY   HCT 32.7 (L) 36.0 - 46.0 %   MCV 76.4 (L) 80.0 - 100.0 fL   MCH 21.3 (L) 26.0 - 34.0 pg   MCHC 27.8 (L) 30.0 - 36.0 g/dL   RDW 26.5 (H) 11.5 - 15.5 %   Platelets 150 150 - 400 K/uL    Comment: REPEATED TO VERIFY   nRBC 11.0 (H) 0.0 - 0.2 %   Neutrophils Relative % 69 %   Neutro Abs 9.5 (H) 1.7 - 7.7 K/uL   Lymphocytes Relative 12 %   Lymphs Abs 1.7 0.7 - 4.0 K/uL   Monocytes Relative 16 %   Monocytes Absolute 2.2 (H) 0.1 - 1.0 K/uL   Eosinophils Relative 0 %   Eosinophils Absolute 0.0 0.0 - 0.5 K/uL   Basophils Relative 1 %   Basophils Absolute 0.1 0.0 - 0.1 K/uL   Immature Granulocytes 2 %   Abs Immature Granulocytes 0.22 (H) 0.00 - 0.07 K/uL   Tear Drop Cells PRESENT    Tammy Sours Bodies PRESENT    Polychromasia PRESENT    Target Cells PRESENT     Comment: Performed at [facility], [place] [address], [place], [place] [address]  Lipase, blood     Status: None   Collection Time: 09/19/18  7:49 PM  Result Value Ref Range   Lipase 17 11 - 51 U/L    Comment: Performed at [facility] [address], [place], [place] [address]  Lactic acid, plasma     Status: None   Collection Time: 09/19/18  7:49 PM  Result Value Ref Range   Lactic Acid, Venous 1.0 0.5 - 1.9 mmol/L    Comment: Performed at [facility] [address], [place], [place] [address]  I-Stat beta hCG blood, ED     Status: None   Collection Time: 09/19/18  8:07 PM  Result Value Ref Range   I-stat hCG, quantitative <5.0 <5 mIU/mL   Comment 3            Comment:   GEST. AGE      CONC.  (mIU/mL)   <=1 WEEK        5 - 50     2 WEEKS       50 - 500     3 WEEKS       100 - 10,000     4 WEEKS     1,000 - 30,000        FEMALE AND NON-PREGNANT FEMALE:     LESS THAN 5 mIU/mL   POCT I-Stat EG7     Status: Abnormal   Collection Time: 09/19/18  8:07 PM  Result Value Ref Range   pH, Ven 7.429 7.250 - 7.430   pCO2, Ven 39.6 (L) 44.0 - 60.0 mmHg   pO2, Ven 92.0 (H) 32.0 - 45.0 mmHg   Bicarbonate 26.2 20.0 - 28.0 mmol/L   TCO2 27 22 - 32 mmol/L   O2 Saturation 97.0 %   Acid-Base Excess 2.0 0.0 - 2.0 mmol/L   Sodium 138 135 - 145 mmol/L   Potassium 3.9 3.5 - 5.1 mmol/L   Calcium, Ion 1.04 (L) 1.15 - 1.40 mmol/L   HCT 32.0 (L) 36.0 - 46.0 %   Hemoglobin 10.9 (L) 12.0 - 15.0 g/dL   Patient temperature HIDE    Sample type VENOUS   Urinalysis, Routine w reflex microscopic (not at [facility])     Status: Abnormal   Collection Time: 09/19/18  9:21 PM  Result Value Ref Range   Color, Urine YELLOW YELLOW   APPearance CLEAR CLEAR   Specific Gravity, Urine >1.046 (H) 1.005 - 1.030   pH 7.0 5.0 - 8.0   Glucose, UA NEGATIVE NEGATIVE mg/dL   Hgb urine dipstick LARGE (A) NEGATIVE   Bilirubin Urine NEGATIVE NEGATIVE   Ketones, ur 80 (A) NEGATIVE mg/dL   Protein, ur 30 (A) NEGATIVE mg/dL   Nitrite NEGATIVE NEGATIVE   Leukocytes,Ua NEGATIVE NEGATIVE   RBC / HPF >50 (H) 0 - 5 RBC/hpf   WBC, UA 6-10 0 - 5 WBC/hpf   Bacteria, UA NONE SEEN NONE SEEN   Squamous Epithelial / LPF 0-5 0 - 5   Mucus PRESENT     Comment: Performed at [facility], [address], [place], [place] [address]  Lactic acid, plasma     Status: None   Collection Time: 09/19/18 11:11 PM  Result Value Ref Range   Lactic Acid, Venous 0.9 0.5 - 1.9 mmol/L    Comment:  Performed at [facility] [address] [place], [place] [address]   Ct Head Wo Contrast  Result Date: 09/19/2018 CLINICAL DATA:  58 year old female with increased confusion over the past week, found down today. EXAM: CT HEAD WITHOUT CONTRAST TECHNIQUE: Contiguous axial images were obtained from the base of the skull through the vertex without intravenous contrast. COMPARISON:  PET-CT 01/06/2015. FINDINGS: Brain: Normal cerebral volume. No midline shift, ventriculomegaly, mass effect, evidence of mass lesion, intracranial hemorrhage or evidence of cortically based acute infarction. Possible small chronic  infarct in the right cerebellar hemisphere on series 3, image 7. Mild scattered cerebral white matter hypodensity. Vascular: Calcified atherosclerosis at the skull base. No suspicious intracranial vascular hyperdensity. Skull: No acute osseous abnormality identified. Sinuses/Orbits: Visualized paranasal sinuses and mastoids are well pneumatized. Minor mucosal thickening in the left maxillary sinus. Other: Negative orbits. Visualized scalp soft tissues are within normal limits. IMPRESSION: 1. No acute intracranial abnormality. 2. Possible small chronic right cerebellar infarct. Mild nonspecific white matter changes most commonly due to small vessel disease. Electronically Signed   By: Genevie Ann M.D.   On: 09/19/2018 19:48   Mr Jeri Cos And Wo Contrast  Result Date: 09/20/2018 CLINICAL DATA:  58 y/o F; increased confusion over the last week. Found on floor today. EXAM: MRI HEAD WITHOUT AND WITH CONTRAST TECHNIQUE: Multiplanar, multiecho pulse sequences of the brain and surrounding structures were obtained without and with intravenous contrast. CONTRAST:  7.5 cc Gadavist COMPARISON:  09/19/2018 CT head. FINDINGS: Brain: Ill-defined foci of reduced diffusion are present within the right greater than left centrum semiovale a posterior aspect of inferior frontal gyrus as well as the splenium of corpus  callosum (series 7, image 49). No associated hemorrhage or mass effect. There are several foci of T2 FLAIR hyperintense signal abnormality within subcortical and periventricular white matter, left greater than right posterior limb of internal capsule,, body of corpus callosum, and the right cerebellar hemisphere. There are a few punctate foci of susceptibility hypointensity scattered in a nonspecific distribution throughout the bilateral frontal and parietal lobes compatible with hemosiderin deposition of chronic microhemorrhage. No extra-axial collection, hydrocephalus, mass effect, or herniation. After administration of intravenous contrast there is no abnormal enhancement. Vascular: Normal flow voids. Skull and upper cervical spine: Normal marrow signal. Sinuses/Orbits: Mild paranasal sinus mucosal thickening greatest in the left maxillary sinus. No abnormal signal of mastoid air cells. Orbits are unremarkable. Other: None. IMPRESSION: 1. Ill-defined white matter lesions with reduced diffusion are present within the left-greater-than-right inferior frontal gyri as well as the splenium of corpus callosum. The pattern is atypical for ischemic infarction. There is a broad differential, for example transit metabolic derangement, demyelination, seizure, drug toxicity (antiepileptic, metronidazole, sympathomimetic.), ETOH encephalopathy, or hypoglycemia. 2. Background of nonspecific white matter hyperintensities may represent chronic sequelae of the acute process described above or age advanced chronic microvascular ischemic changes. Electronically Signed   By: Kristine Garbe M.D.   On: 09/20/2018 00:34   Ct Abdomen Pelvis W Contrast  Result Date: 09/19/2018 CLINICAL DATA:  Right upper quadrant pain. EXAM: CT ABDOMEN AND PELVIS WITH CONTRAST TECHNIQUE: Multidetector CT imaging of the abdomen and pelvis was performed using the standard protocol following bolus administration of intravenous contrast.  CONTRAST:  112mL OMNIPAQUE IOHEXOL 300 MG/ML  SOLN COMPARISON:  Head CT 01/06/2015 FINDINGS: There is residual contrast material demonstrated throughout the colon. Streak artifact arising from this dense contrast material limits the examination in some areas. Lower chest: Emphysematous changes and linear fibrosis or scarring in the lung bases. Suggestion of patchy airspace infiltrates in the left lung base possibly due to pneumonia. Motion artifact limits evaluation. Hepatobiliary: No focal liver abnormality is seen. No gallstones, gallbladder wall thickening, or biliary dilatation. Pancreas: Unremarkable. No pancreatic ductal dilatation or surrounding inflammatory changes. Spleen: Surgically absent Adrenals/Urinary Tract: Limited visualization of the kidneys. No obvious abnormalities identified. Bladder wall is not thickened. No bladder filling defects. Residual contrast material in the bladder. Stomach/Bowel: Stomach, small bowel, and colon are not abnormally distended. Limited visualization particularly of the  colon due to streak artifact. No obvious inflammatory changes. Appendix is not identified. Vascular/Lymphatic: Limited visualization of the abdominal aorta due to streak artifact but no aneurysm is suggested. Reproductive: Uterus is surgically absent. No obvious pelvic mass although visualization of the pelvis is limited due to streak artifact. Other: No free air or free fluid in the visualized abdomen. Musculoskeletal: No acute or significant osseous findings. IMPRESSION: 1. Residual contrast material throughout the colon limits examination. 2. No acute process demonstrated in the abdomen or pelvis. No evidence of bowel obstruction or inflammation. 3. Suggestion of patchy airspace infiltrates in the left lung base possibly indicating pneumonia. Electronically Signed   By: Lucienne Capers M.D.   On: 09/19/2018 22:17   Dg Chest Port 1 View  Result Date: 09/19/2018 CLINICAL DATA:  58 year old female  found down with confusion. EXAM: PORTABLE CHEST 1 VIEW COMPARISON:  Chest radiographs 08/13/2018 and earlier. FINDINGS: Portable AP semi upright view at 1915 hours. Lung volumes and mediastinal contours are within normal limits. Subtotal clearing of left basilar opacity since 08/13/2018. No areas of worsening ventilation. No pneumothorax or pulmonary edema. Hyperdense material in the left upper quadrant may be retained barium at the splenic flexure, uncertain. Extensive prior cervical spine surgery. No acute osseous abnormality identified. IMPRESSION: Regressed but not fully resolved mild left lung base opacity since February. No new cardiopulmonary abnormality. Electronically Signed   By: Genevie Ann M.D.   On: 09/19/2018 19:33    Pending Labs Unresulted Labs (From admission, onward)    Start     Ordered   09/19/18 2107  Hepatitis panel, acute  ONCE - STAT,   STAT     09/19/18 2106   09/19/18 1949  Pathologist smear review  Once,   STAT     09/19/18 1949   09/19/18 1903  Urine culture  ONCE - STAT,   STAT     09/19/18 1904          Vitals/Pain Today's Vitals   09/19/18 2245 09/19/18 2251 09/20/18 0045 09/20/18 0055  BP: (!) 143/83  139/82   Pulse: 76  73   Resp: 19  14   Temp:      TempSrc:      SpO2: 92%  93% 96%  Weight:      Height:      PainSc:  0-No pain      Isolation Precautions No active isolations  Medications Medications  doxycycline (VIBRAMYCIN) 100 mg in sodium chloride 0.9 % 250 mL IVPB (100 mg Intravenous New Bag/Given 09/20/18 0101)  iohexol (OMNIPAQUE) 300 MG/ML solution 100 mL (100 mLs Intravenous Contrast Given 09/19/18 2128)  gadobutrol (GADAVIST) 1 MMOL/ML injection 7.5 mL (7.5 mLs Intravenous Contrast Given 09/19/18 2356)    Mobility non-ambulatory High fall risk   Focused Assessments Neuro Assessment Handoff:  Swallow screen pass? N/A Cardiac Rhythm: Normal sinus rhythm       Neuro Assessment: Exceptions to WDL Neuro Checks:      Last Documented  NIHSS Modified Score:   Has TPA been given? No If patient is a Neuro Trauma and patient is going to OR before floor call report to [place] nurse: [phone] or [phone]     R Recommendations: See Admitting Provider Note  Report given to:   Additional Notes:  Pt AMS, VSS, possible PNA

## 2018-09-20 NOTE — Progress Notes (Signed)
PROGRESS NOTE    Amber Faulkner  TFT:[id number] DOB: 1961-01-24 DOA: 09/19/2018 PCP: Hali Marry, MD    Brief Narrative:  58 year old female brought from home, found on the floor.  She is unable to give any history.  She was in the hospital in the past, chart review shows that she has history of seizure, hypertension, type 2 diabetes, questionable history of HIV, so far negative HIV test on our records from 4 years ago.On arrival to the emergency room, she was with significant aphasia.  Previous records also indicate that she is on chronic pain medications and anxiety medications.  Patient was just repeating sentences.  There was no focal deficit.  She was found with mild LFT elevation.  CT scan of the abdomen did not show any significant findings.  Chest x-ray with suspected pneumonia.  MRI showed bilateral nonspecific changes.   Assessment & Plan:   Principal Problem:   Acute encephalopathy Active Problems:   Essential hypertension   Uncontrolled type 2 diabetes mellitus with diabetic polyneuropathy, with long-term current use of insulin (HCC)   Abdominal pain   Elevated LFTs   Encephalopathy acute  Acute metabolic encephalopathy:  Cause unknown.  Acute infection versus polypharmacy or possibility of seizure and postictal state. Continue monitoring.  Neurochecks every 4 hours.  Patient is awake enough, will allow her to eat under supervision. EEG pending.  Changed to IV seizure medications by neurology for better absorption. Suspected polypharmacy, discontinuing all sedating medications. Treating as presumptive pneumonia. Followed by neurology.  She has abnormal MRI, further management as per them.  Suspected pneumonia: Left lower lobe pneumonia.  Presented with leukocytosis and cough.  She is a smoker with COPD.  Will treat as pneumonia.  There was concern about intra-abdominal infection, she is on ciprofloxacin and Flagyl.  Will continue for next 24 hours until patient's  clinical improvement or further localization of infection.  Abnormal LFTs: Cause unknown.  CT scan of the abdomen was fairly normal.  She had no evidence of biliary obstruction.  No cholecystitis.  On repeat chemistry panel, bilirubin is normal.  Patient have might have transient transaminase elevation from shock or hypotension. Will recheck in the morning to ensure stabilization.  Hepatitis panel is pending.  Type 2 diabetes: On insulin.  Continue insulin with close monitoring.  Essential hypertension: Blood pressures are fairly normal.  Polypharmacy: Unable to take any history now.  Holding all reported medications that are causing sedation.  History of HIV: There are many places in the chart that says patient has history of HIV.  HIV test in 2016 was negative.  Will repeat before proceeding to check CD4 counts.  Patient has acute encephalopathy.  She has multiple medical problems.  She will need inpatient monitoring and treatment.  Anticipate hospitalization more than 2 midnights.  DVT prophylaxis: SCD Code Status: full code Family Communication: no family at bed side Disposition Plan: home when stable.    Consultants:   Neurology  Procedures:   EEG  Antimicrobials:   Ciprofloxacin, 09/19/2018  Flagyl, 09/19/2018 ongoing   Subjective: Patient was seen and examined at the bedside.  Discussed with admitting physician.  Patient continues to be confused.  She is otherwise hemodynamically stable. She would just repeat same sentences.  Follows simple commands.  Objective: Vitals:   09/20/18 0345 09/20/18 0350 09/20/18 0724 09/20/18 1213  BP: (!) 150/87  111/68 (!) 147/85  Pulse: 72 70 77 76  Resp: 18 16 18 19   Temp: 98.2 F (36.8 C)  97.9 F (36.6 C) 98.2 F (36.8 C) 98.3 F (36.8 C)  TempSrc: Oral Oral Oral Oral  SpO2: 94% 92% 90% 92%  Weight: 79.5 kg     Height: 5\' 5"  (1.651 m)       Intake/Output Summary (Last 24 hours) at 09/20/2018 1306 Last data filed at  09/20/2018 1200 Gross per 24 hour  Intake 335 ml  Output 350 ml  Net -15 ml   Filed Weights   09/19/18 1844 09/20/18 0345  Weight: 80.7 kg 79.5 kg    Examination:  General exam: Appears calm and comfortable  Respiratory system: Clear to auscultation. Respiratory effort normal. Cardiovascular system: S1 & S2 heard, RRR. No JVD, murmurs, rubs, gallops or clicks. No pedal edema. Gastrointestinal system: Abdomen is nondistended, soft and nontender. No organomegaly or masses felt. Normal bowel sounds heard. Central nervous system: Alert and awake but not oriented.  No focal neurological deficits. Extremities: Symmetric 5 x 5 power. Skin: No rashes, lesions or ulcers Psychiatry: Confused.  Repeating sentences.  Not oriented.    Data Reviewed: I have personally reviewed following labs and imaging studies  CBC: Recent Labs  Lab 09/19/18 1949 09/19/18 2007 09/20/18 0501  WBC 13.8*  --  14.5*  NEUTROABS 9.5*  --  9.3*  HGB 9.1* 10.9* 8.8*  HCT 32.7* 32.0* 30.9*  MCV 76.4*  --  75.9*  PLT 150  --  PLATELET CLUMPS NOTED ON SMEAR, UNABLE TO ESTIMATE   Basic Metabolic Panel: Recent Labs  Lab 09/19/18 1949 09/19/18 2007 09/20/18 0501  NA 139 138 139  K 3.7 3.9 3.8  CL 103  --  101  CO2 25  --  24  GLUCOSE 109*  --  105*  BUN 20  --  17  CREATININE 0.67  --  0.75  CALCIUM 8.3*  --  8.5*   GFR: Estimated Creatinine Clearance: 82.8 mL/min (by C-G formula based on SCr of 0.75 mg/dL). Liver Function Tests: Recent Labs  Lab 09/19/18 1949 09/20/18 0501  AST 407* 323*  ALT 593* 564*  ALKPHOS 120 118  BILITOT 1.0 1.2  PROT 6.1* 6.3*  ALBUMIN 3.3* 3.2*   Recent Labs  Lab 09/19/18 1949  LIPASE 17   Recent Labs  Lab 09/19/18 1949  AMMONIA 14   Coagulation Profile: No results for input(s): INR, PROTIME in the last 168 hours. Cardiac Enzymes: No results for input(s): CKTOTAL, CKMB, CKMBINDEX, TROPONINI in the last 168 hours. BNP (last 3 results) No results for  input(s): PROBNP in the last 8760 hours. HbA1C: No results for input(s): HGBA1C in the last 72 hours. CBG: Recent Labs  Lab 09/19/18 1844 09/20/18 0729 09/20/18 1211  GLUCAP 96 110* 115*   Lipid Profile: No results for input(s): CHOL, HDL, LDLCALC, TRIG, CHOLHDL, LDLDIRECT in the last 72 hours. Thyroid Function Tests: No results for input(s): TSH, T4TOTAL, FREET4, T3FREE, THYROIDAB in the last 72 hours. Anemia Panel: No results for input(s): VITAMINB12, FOLATE, FERRITIN, TIBC, IRON, RETICCTPCT in the last 72 hours. Sepsis Labs: Recent Labs  Lab 09/19/18 1949 09/19/18 2311  LATICACIDVEN 1.0 0.9    Recent Results (from the past 240 hour(s))  MRSA PCR Screening     Status: None   Collection Time: 09/20/18  2:10 AM  Result Value Ref Range Status   MRSA by PCR NEGATIVE NEGATIVE Final    Comment:        The GeneXpert MRSA Assay (FDA approved for NASAL specimens only), is one component of a comprehensive MRSA colonization  surveillance program. It is not intended to diagnose MRSA infection nor to guide or monitor treatment for MRSA infections. Performed at [facility], [place] [address], [place], [place] [address]          Radiology Studies: Ct Head Wo Contrast  Result Date: 09/19/2018 CLINICAL DATA:  58 year old female with increased confusion over the past week, found down today. EXAM: CT HEAD WITHOUT CONTRAST TECHNIQUE: Contiguous axial images were obtained from the base of the skull through the vertex without intravenous contrast. COMPARISON:  PET-CT 01/06/2015. FINDINGS: Brain: Normal cerebral volume. No midline shift, ventriculomegaly, mass effect, evidence of mass lesion, intracranial hemorrhage or evidence of cortically based acute infarction. Possible small chronic infarct in the right cerebellar hemisphere on series 3, image 7. Mild scattered cerebral white matter hypodensity. Vascular: Calcified atherosclerosis at the skull base. No suspicious intracranial  vascular hyperdensity. Skull: No acute osseous abnormality identified. Sinuses/Orbits: Visualized paranasal sinuses and mastoids are well pneumatized. Minor mucosal thickening in the left maxillary sinus. Other: Negative orbits. Visualized scalp soft tissues are within normal limits. IMPRESSION: 1. No acute intracranial abnormality. 2. Possible small chronic right cerebellar infarct. Mild nonspecific white matter changes most commonly due to small vessel disease. Electronically Signed   By: Genevie Ann M.D.   On: 09/19/2018 19:48   Mr Jeri Cos And Wo Contrast  Result Date: 09/20/2018 CLINICAL DATA:  58 y/o F; increased confusion over the last week. Found on floor today. EXAM: MRI HEAD WITHOUT AND WITH CONTRAST TECHNIQUE: Multiplanar, multiecho pulse sequences of the brain and surrounding structures were obtained without and with intravenous contrast. CONTRAST:  7.5 cc Gadavist COMPARISON:  09/19/2018 CT head. FINDINGS: Brain: Ill-defined foci of reduced diffusion are present within the right greater than left centrum semiovale a posterior aspect of inferior frontal gyrus as well as the splenium of corpus callosum (series 7, image 49). No associated hemorrhage or mass effect. There are several foci of T2 FLAIR hyperintense signal abnormality within subcortical and periventricular white matter, left greater than right posterior limb of internal capsule,, body of corpus callosum, and the right cerebellar hemisphere. There are a few punctate foci of susceptibility hypointensity scattered in a nonspecific distribution throughout the bilateral frontal and parietal lobes compatible with hemosiderin deposition of chronic microhemorrhage. No extra-axial collection, hydrocephalus, mass effect, or herniation. After administration of intravenous contrast there is no abnormal enhancement. Vascular: Normal flow voids. Skull and upper cervical spine: Normal marrow signal. Sinuses/Orbits: Mild paranasal sinus mucosal thickening  greatest in the left maxillary sinus. No abnormal signal of mastoid air cells. Orbits are unremarkable. Other: None. IMPRESSION: 1. Ill-defined white matter lesions with reduced diffusion are present within the left-greater-than-right inferior frontal gyri as well as the splenium of corpus callosum. The pattern is atypical for ischemic infarction. There is a broad differential, for example transit metabolic derangement, demyelination, seizure, drug toxicity (antiepileptic, metronidazole, sympathomimetic.), ETOH encephalopathy, or hypoglycemia. 2. Background of nonspecific white matter hyperintensities may represent chronic sequelae of the acute process described above or age advanced chronic microvascular ischemic changes. Electronically Signed   By: Kristine Garbe M.D.   On: 09/20/2018 00:34   Ct Abdomen Pelvis W Contrast  Result Date: 09/19/2018 CLINICAL DATA:  Right upper quadrant pain. EXAM: CT ABDOMEN AND PELVIS WITH CONTRAST TECHNIQUE: Multidetector CT imaging of the abdomen and pelvis was performed using the standard protocol following bolus administration of intravenous contrast. CONTRAST:  134mL OMNIPAQUE IOHEXOL 300 MG/ML  SOLN COMPARISON:  Head CT 01/06/2015 FINDINGS: There is residual  contrast material demonstrated throughout the colon. Streak artifact arising from this dense contrast material limits the examination in some areas. Lower chest: Emphysematous changes and linear fibrosis or scarring in the lung bases. Suggestion of patchy airspace infiltrates in the left lung base possibly due to pneumonia. Motion artifact limits evaluation. Hepatobiliary: No focal liver abnormality is seen. No gallstones, gallbladder wall thickening, or biliary dilatation. Pancreas: Unremarkable. No pancreatic ductal dilatation or surrounding inflammatory changes. Spleen: Surgically absent Adrenals/Urinary Tract: Limited visualization of the kidneys. No obvious abnormalities identified. Bladder wall is not  thickened. No bladder filling defects. Residual contrast material in the bladder. Stomach/Bowel: Stomach, small bowel, and colon are not abnormally distended. Limited visualization particularly of the colon due to streak artifact. No obvious inflammatory changes. Appendix is not identified. Vascular/Lymphatic: Limited visualization of the abdominal aorta due to streak artifact but no aneurysm is suggested. Reproductive: Uterus is surgically absent. No obvious pelvic mass although visualization of the pelvis is limited due to streak artifact. Other: No free air or free fluid in the visualized abdomen. Musculoskeletal: No acute or significant osseous findings. IMPRESSION: 1. Residual contrast material throughout the colon limits examination. 2. No acute process demonstrated in the abdomen or pelvis. No evidence of bowel obstruction or inflammation. 3. Suggestion of patchy airspace infiltrates in the left lung base possibly indicating pneumonia. Electronically Signed   By: Lucienne Capers M.D.   On: 09/19/2018 22:17   Dg Chest Port 1 View  Result Date: 09/19/2018 CLINICAL DATA:  58 year old female found down with confusion. EXAM: PORTABLE CHEST 1 VIEW COMPARISON:  Chest radiographs 08/13/2018 and earlier. FINDINGS: Portable AP semi upright view at 1915 hours. Lung volumes and mediastinal contours are within normal limits. Subtotal clearing of left basilar opacity since 08/13/2018. No areas of worsening ventilation. No pneumothorax or pulmonary edema. Hyperdense material in the left upper quadrant may be retained barium at the splenic flexure, uncertain. Extensive prior cervical spine surgery. No acute osseous abnormality identified. IMPRESSION: Regressed but not fully resolved mild left lung base opacity since February. No new cardiopulmonary abnormality. Electronically Signed   By: Genevie Ann M.D.   On: 09/19/2018 19:33        Scheduled Meds:  insulin aspart  0-9 Units Subcutaneous Q4H   Continuous  Infusions:  ciprofloxacin 400 mg (09/20/18 0834)   lacosamide (VIMPAT) IV 100 mg (09/20/18 0831)   metronidazole 500 mg (09/20/18 6073)     LOS: 0 days    Time spent: 25 minutes     Barb Merino, MD [facility] Pager [phone]  If 7PM-7AM, please contact night-coverage [email or web address] Password [facility] 09/20/2018, 1:06 PM

## 2018-09-20 NOTE — H&P (Signed)
History and Physical    Amber Faulkner:[id number] DOB: 1961/05/08 DOA: 09/19/2018  PCP: Hali Marry, MD  Patient coming from: Home.  Chief Complaint: Confusion.  History obtained from ER physician as no family member at the bedside at this time.  HPI: Amber Faulkner is a 58 y.o. female with history of COPD, chronic pain, hypertension, ITP status post splenectomy, seizure disorder who was recently admitted last month for iron deficiency anemia received PRBC transfusion was brought to the ER by patient's family after patient was found to be increasingly confused over the last 1 week.  Patient also was noticed to be complaining of abdominal pain.  Patient is confused is not able to provide history and is able to not talk well.  Was following commands but not able to provide history.  Reviewed patient's chart in care everywhere patient had recently followed up with gastroenterologist and pulmonologist.  Had sleep study done.  Pulmonologist notes suggest pulmonologist requested to be off sedative medications.  GI was planning EGD.  ED Course: In the ER patient was alert awake but unable to talk appears to be confused.  Following commands moves all extremities.  ABG done showed a pH of 7.42 PCO2 of 39.6 oxygen saturation of 92.  Ammonia levels were normal.  Elation patient abdomen appears tender.  LFTs were elevated with AST of 407 ALT of 483 total bilirubin of 1.  Alk phos was normal.  CT abdomen and pelvis did not show anything acute in the abdomen.  Showed possible infiltrates in the lung.  In the ER patient had CT head followed by MRI brain which shows abnormalities with multiple differentials for which I have consulted neurologist.  Chest x-ray was showing some lung opacity.  Is being followed by pulmonologist for lung nodule.  EKG shows normal sinus rhythm.  UA was largely unremarkable.  Alcohol level is negative.  Drug screen pending.  Review of Systems: As per HPI, rest all  negative.   Past Medical History:  Diagnosis Date   ADD (attention deficit disorder)    Anxiety    Chronic pain    Diabetes mellitus    HIV infection ([place])    Hyperlipidemia    Neuromuscular disorder [facility])     Past Surgical History:  Procedure Laterality Date   ABDOMINAL HYSTERECTOMY  1991   complete   CARPAL TUNNEL RELEASE  1993-1994   both wrist   CERVICAL DISCECTOMY  03/25/2017   Dr. Cyndia Diver, [place]   SPLENECTOMY  09/2016   TARSAL TUNNEL RELEASE  1993   left ankle   TUMOR REMOVAL  1993   left thigh     reports that she has been smoking cigarettes. She has been smoking about 1.50 packs per day. She has never used smokeless tobacco. She reports that she does not drink alcohol or use drugs.  Allergies  Allergen Reactions   Cephalosporins Anaphylaxis   Vancomycin Anaphylaxis   Penicillins Hives and Rash   Ampicillin Other (See Comments)    Other reaction(s): anaphylaxis   Carisoprodol Other (See Comments)    Other reaction(s): seizures   Lisinopril Other (See Comments)    Nausea,dizziness,headache   Penicillin G Other (See Comments)    Other reaction(s): anaphylaxis    Family History  Problem Relation Age of Onset   Cancer Sister        hepatic and pancreatic   Cancer Mother  brain and lung   COPD Mother    Hyperlipidemia Father    Hypertension Father    Diabetes Father    Heart disease Father    Cancer Maternal Aunt        breast    Prior to Admission medications   Medication Sig Start Date End Date Taking? Authorizing Provider  ADVAIR DISKUS 250-50 MCG/DOSE AEPB Inhale 1 puff into the lungs 2 times daily. 05/29/18   Hali Marry, MD  albuterol (PROVENTIL HFA;VENTOLIN HFA) 108 (90 Base) MCG/ACT inhaler Inhale 2 puffs into the lungs every 6 (six) hours as needed for wheezing or shortness of breath. 08/27/18   Hali Marry, MD  AMBULATORY NON  FORMULARY MEDICATION Rolling Walker use as needed.  Disp 1 G35 08/13/18   Gregor Hams, MD  carisoprodol (SOMA) 350 MG tablet Take 350 mg by mouth 3 (three) times daily.    Lawrence Marseilles, MD  DULoxetine (CYMBALTA) 30 MG capsule Take 30 mg by mouth 2 (two) times daily. 04/26/18   [provider]  EASY TOUCH PEN NEEDLES 32G X 6 MM MISC USE AS DIRECTED 09/26/14   Hali Marry, MD  ferrous sulfate 325 (65 FE) MG tablet Take 1 tablet (325 mg total) by mouth daily. 08/15/18 08/15/19  Radene Gunning, NP  gabapentin (NEURONTIN) 300 MG capsule Take 1 capsule (300 mg total) by mouth 4 (four) times daily. 06/20/18   Hali Marry, MD  Insulin Detemir (LEVEMIR FLEXTOUCH) 100 UNIT/ML Pen Inject 20 Units into the skin 2 (two) times daily. 08/15/18   Black, Lezlie Octave, NP  insulin lispro (HUMALOG KWIKPEN) 100 UNIT/ML KwikPen Inject 0.15 mLs (15 Units total) into the skin 3 (three) times daily. 05/29/18   Hali Marry, MD  Lacosamide (VIMPAT) 100 MG TABS Take 100 mg by mouth 2 (two) times daily.  12/25/15   [provider]  losartan (COZAAR) 25 MG tablet Take 1 tablet (25 mg total) by mouth daily. 09/06/18   Hali Marry, MD  omeprazole (PRILOSEC) 40 MG capsule Take 1 capsule (40 mg total) by mouth daily. 03/13/18   Hali Marry, MD  ondansetron (ZOFRAN-ODT) 8 MG disintegrating tablet Take 1 tablet (8 mg total) by mouth every 8 (eight) hours as needed for nausea or vomiting. 08/15/18   Black, Lezlie Octave, NP  ONE TOUCH ULTRA TEST test strip USE TO CHECK BLOOD SUGAR THREE TIMES A DAY 04/23/18   Hali Marry, MD  Oxycodone HCl 20 MG TABS Take 20 mg by mouth every 4 (four) hours as needed (pain).     [provider]  PHARMACIST CHOICE LANCETS MISC USE TO CHECK BLOOD SUGAR THREE TIMES A DAY 04/23/18   Hali Marry, MD  potassium chloride (K-DUR,KLOR-CON) 10 MEQ tablet Take 1 tablet (10 mEq total) by mouth once for 1 dose. 09/04/18 09/04/18  Hali Marry, MD  QUEtiapine (SEROQUEL) 200 MG tablet Take 1 tablet (200 mg total) by mouth at bedtime. 05/29/18   Hali Marry, MD  rosuvastatin (CRESTOR) 20 MG tablet Take 1 tablet (20 mg total) by mouth daily. 09/06/18   Hali Marry, MD  SPIRIVA HANDIHALER 18 MCG inhalation capsule Place 1 capsule into inhaler and inhale daily. 05/29/18   Hali Marry, MD  sucralfate (CARAFATE) 1 g tablet Take 1 tablet (1 g total) by mouth 4 (four) times daily -  with meals and at bedtime. 05/29/18   Hali Marry, MD  triamcinolone cream (KENALOG)  0.1 % Apply 1 application topically 2 (two) times daily. 08/20/18   Gregor Hams, MD  venlafaxine XR (EFFEXOR-XR) 75 MG 24 hr capsule Take 1 capsule (75 mg total) by mouth daily with breakfast. 09/06/18   Hali Marry, MD    Physical Exam: Vitals:   09/19/18 2230 09/19/18 2245 09/20/18 0045 09/20/18 0055  BP: 139/77 (!) 143/83 139/82   Pulse: 72 76 73   Resp: '16 19 14   '$ Temp:      TempSrc:      SpO2: 93% 92% 93% 96%  Weight:      Height:          Constitutional: Moderately built and nourished. Vitals:   09/19/18 2230 09/19/18 2245 09/20/18 0045 09/20/18 0055  BP: 139/77 (!) 143/83 139/82   Pulse: 72 76 73   Resp: '16 19 14   '$ Temp:      TempSrc:      SpO2: 93% 92% 93% 96%  Weight:      Height:       Eyes: Anicteric no pallor. ENMT: No discharge from the ears eyes nose and mouth. Neck: No mass felt.  No neck rigidity.  No JVD appreciated. Respiratory: No rhonchi or crepitations. Cardiovascular: S1-S2 heard. Abdomen: Soft tender diffusely no guarding or rigidity no rebound tenderness. Musculoskeletal: No edema. Skin: No rash. Neurologic: Alert awake but unable to talk will not able to bring out words.  Moves all extremities follows commands.  Pupils are equal and reacting to light. Psychiatric: Appears confused.   Labs on Admission: I have personally reviewed following labs and imaging  studies  CBC: Recent Labs  Lab 09/19/18 1949 09/19/18 2007  WBC 13.8*  --   NEUTROABS 9.5*  --   HGB 9.1* 10.9*  HCT 32.7* 32.0*  MCV 76.4*  --   PLT 150  --    Basic Metabolic Panel: Recent Labs  Lab 09/19/18 1949 09/19/18 2007  NA 139 138  K 3.7 3.9  CL 103  --   CO2 25  --   GLUCOSE 109*  --   BUN 20  --   CREATININE 0.67  --   CALCIUM 8.3*  --    GFR: Estimated Creatinine Clearance: 81.7 mL/min (by C-G formula based on SCr of 0.67 mg/dL). Liver Function Tests: Recent Labs  Lab 09/19/18 1949  AST 407*  ALT 593*  ALKPHOS 120  BILITOT 1.0  PROT 6.1*  ALBUMIN 3.3*   Recent Labs  Lab 09/19/18 1949  LIPASE 17   Recent Labs  Lab 09/19/18 1949  AMMONIA 14   Coagulation Profile: No results for input(s): INR, PROTIME in the last 168 hours. Cardiac Enzymes: No results for input(s): CKTOTAL, CKMB, CKMBINDEX, TROPONINI in the last 168 hours. BNP (last 3 results) No results for input(s): PROBNP in the last 8760 hours. HbA1C: No results for input(s): HGBA1C in the last 72 hours. CBG: Recent Labs  Lab 09/19/18 1844  GLUCAP 96   Lipid Profile: No results for input(s): CHOL, HDL, LDLCALC, TRIG, CHOLHDL, LDLDIRECT in the last 72 hours. Thyroid Function Tests: No results for input(s): TSH, T4TOTAL, FREET4, T3FREE, THYROIDAB in the last 72 hours. Anemia Panel: No results for input(s): VITAMINB12, FOLATE, FERRITIN, TIBC, IRON, RETICCTPCT in the last 72 hours. Urine analysis:    Component Value Date/Time   COLORURINE YELLOW 09/19/2018 2121   APPEARANCEUR CLEAR 09/19/2018 2121   LABSPEC >1.046 (H) 09/19/2018 2121   PHURINE 7.0 09/19/2018 2121   GLUCOSEU NEGATIVE 09/19/2018 2121  HGBUR LARGE (A) 09/19/2018 2121   BILIRUBINUR NEGATIVE 09/19/2018 2121   BILIRUBINUR Negative 02/02/2018 1609   KETONESUR 80 (A) 09/19/2018 2121   PROTEINUR 30 (A) 09/19/2018 2121   UROBILINOGEN 0.2 02/02/2018 1609   NITRITE NEGATIVE 09/19/2018 2121   LEUKOCYTESUR NEGATIVE  09/19/2018 2121   Sepsis Labs: '@LABRCNTIP'$ (procalcitonin:4,lacticidven:4) )No results found for this or any previous visit (from the past 240 hour(s)).   Radiological Exams on Admission: Ct Head Wo Contrast  Result Date: 09/19/2018 CLINICAL DATA:  58 year old female with increased confusion over the past week, found down today. EXAM: CT HEAD WITHOUT CONTRAST TECHNIQUE: Contiguous axial images were obtained from the base of the skull through the vertex without intravenous contrast. COMPARISON:  PET-CT 01/06/2015. FINDINGS: Brain: Normal cerebral volume. No midline shift, ventriculomegaly, mass effect, evidence of mass lesion, intracranial hemorrhage or evidence of cortically based acute infarction. Possible small chronic infarct in the right cerebellar hemisphere on series 3, image 7. Mild scattered cerebral white matter hypodensity. Vascular: Calcified atherosclerosis at the skull base. No suspicious intracranial vascular hyperdensity. Skull: No acute osseous abnormality identified. Sinuses/Orbits: Visualized paranasal sinuses and mastoids are well pneumatized. Minor mucosal thickening in the left maxillary sinus. Other: Negative orbits. Visualized scalp soft tissues are within normal limits. IMPRESSION: 1. No acute intracranial abnormality. 2. Possible small chronic right cerebellar infarct. Mild nonspecific white matter changes most commonly due to small vessel disease. Electronically Signed   By: Genevie Ann M.D.   On: 09/19/2018 19:48   Mr Jeri Cos And Wo Contrast  Result Date: 09/20/2018 CLINICAL DATA:  58 y/o F; increased confusion over the last week. Found on floor today. EXAM: MRI HEAD WITHOUT AND WITH CONTRAST TECHNIQUE: Multiplanar, multiecho pulse sequences of the brain and surrounding structures were obtained without and with intravenous contrast. CONTRAST:  7.5 cc Gadavist COMPARISON:  09/19/2018 CT head. FINDINGS: Brain: Ill-defined foci of reduced diffusion are present within the right greater  than left centrum semiovale a posterior aspect of inferior frontal gyrus as well as the splenium of corpus callosum (series 7, image 49). No associated hemorrhage or mass effect. There are several foci of T2 FLAIR hyperintense signal abnormality within subcortical and periventricular white matter, left greater than right posterior limb of internal capsule,, body of corpus callosum, and the right cerebellar hemisphere. There are a few punctate foci of susceptibility hypointensity scattered in a nonspecific distribution throughout the bilateral frontal and parietal lobes compatible with hemosiderin deposition of chronic microhemorrhage. No extra-axial collection, hydrocephalus, mass effect, or herniation. After administration of intravenous contrast there is no abnormal enhancement. Vascular: Normal flow voids. Skull and upper cervical spine: Normal marrow signal. Sinuses/Orbits: Mild paranasal sinus mucosal thickening greatest in the left maxillary sinus. No abnormal signal of mastoid air cells. Orbits are unremarkable. Other: None. IMPRESSION: 1. Ill-defined white matter lesions with reduced diffusion are present within the left-greater-than-right inferior frontal gyri as well as the splenium of corpus callosum. The pattern is atypical for ischemic infarction. There is a broad differential, for example transit metabolic derangement, demyelination, seizure, drug toxicity (antiepileptic, metronidazole, sympathomimetic.), ETOH encephalopathy, or hypoglycemia. 2. Background of nonspecific white matter hyperintensities may represent chronic sequelae of the acute process described above or age advanced chronic microvascular ischemic changes. Electronically Signed   By: Kristine Garbe M.D.   On: 09/20/2018 00:34   Ct Abdomen Pelvis W Contrast  Result Date: 09/19/2018 CLINICAL DATA:  Right upper quadrant pain. EXAM: CT ABDOMEN AND PELVIS WITH CONTRAST TECHNIQUE: Multidetector CT imaging of the abdomen and  pelvis was performed using the standard protocol following bolus administration of intravenous contrast. CONTRAST:  168m OMNIPAQUE IOHEXOL 300 MG/ML  SOLN COMPARISON:  Head CT 01/06/2015 FINDINGS: There is residual contrast material demonstrated throughout the colon. Streak artifact arising from this dense contrast material limits the examination in some areas. Lower chest: Emphysematous changes and linear fibrosis or scarring in the lung bases. Suggestion of patchy airspace infiltrates in the left lung base possibly due to pneumonia. Motion artifact limits evaluation. Hepatobiliary: No focal liver abnormality is seen. No gallstones, gallbladder wall thickening, or biliary dilatation. Pancreas: Unremarkable. No pancreatic ductal dilatation or surrounding inflammatory changes. Spleen: Surgically absent Adrenals/Urinary Tract: Limited visualization of the kidneys. No obvious abnormalities identified. Bladder wall is not thickened. No bladder filling defects. Residual contrast material in the bladder. Stomach/Bowel: Stomach, small bowel, and colon are not abnormally distended. Limited visualization particularly of the colon due to streak artifact. No obvious inflammatory changes. Appendix is not identified. Vascular/Lymphatic: Limited visualization of the abdominal aorta due to streak artifact but no aneurysm is suggested. Reproductive: Uterus is surgically absent. No obvious pelvic mass although visualization of the pelvis is limited due to streak artifact. Other: No free air or free fluid in the visualized abdomen. Musculoskeletal: No acute or significant osseous findings. IMPRESSION: 1. Residual contrast material throughout the colon limits examination. 2. No acute process demonstrated in the abdomen or pelvis. No evidence of bowel obstruction or inflammation. 3. Suggestion of patchy airspace infiltrates in the left lung base possibly indicating pneumonia. Electronically Signed   By: WLucienne CapersM.D.   On:  09/19/2018 22:17   Dg Chest Port 1 View  Result Date: 09/19/2018 CLINICAL DATA:  58year old female found down with confusion. EXAM: PORTABLE CHEST 1 VIEW COMPARISON:  Chest radiographs 08/13/2018 and earlier. FINDINGS: Portable AP semi upright view at 1915 hours. Lung volumes and mediastinal contours are within normal limits. Subtotal clearing of left basilar opacity since 08/13/2018. No areas of worsening ventilation. No pneumothorax or pulmonary edema. Hyperdense material in the left upper quadrant may be retained barium at the splenic flexure, uncertain. Extensive prior cervical spine surgery. No acute osseous abnormality identified. IMPRESSION: Regressed but not fully resolved mild left lung base opacity since February. No new cardiopulmonary abnormality. Electronically Signed   By: HGenevie AnnM.D.   On: 09/19/2018 19:33    EKG: Independently reviewed.  Normal sinus rhythm.  Assessment/Plan Principal Problem:   Acute encephalopathy Active Problems:   Essential hypertension   Uncontrolled type 2 diabetes mellitus with diabetic polyneuropathy, with long-term current use of insulin (HCC)   Abdominal pain   Elevated LFTs    1. Acute encephalopathy -cause not clear.  Abnormal MRI brain scan.  Discussed with on-call neurologist Dr. AMalen Gauze  At this time patient's antiseizure medications will be dosed IV.  Vimpat has been dosed IV but unable to dose Topamax.  Will hold most of the sedating medications.  Closely monitor and stepdown we will keep n.p.o. for now.  EEG has been ordered.  Further recommendations per neurologist. 2. Abdominal pain with elevated LFTs -CT scan has been largely unremarkable.  If LFTs continues to increasing trend may need MRCP or other radiologic procedure.  Will check acute hepatitis panel and Tylenol levels. 3. History of seizures see #1. 4. Iron deficiency anemia recently admitted last month for transfusion.  Following up with GI in consult.  Follow CBC. 5. History of  ITP status post colectomy. 6. Lung infiltrate seen on CT scan has been placed on  empiric antibiotics for now. 7. Lung nodule being followed by pulmonologist. 8. COPD.  Place patient on nebulizer and Pulmicort for now. 9. Chronic pain and depression presently n.p.o. and holding of most of the medication since 1 of the differentials could be medication induced. 10. Multiple sclerosis per the chart. 11. History of HIV in the chart but no definite proof.  Will probably need to repeat labs.  Check HIV status.   DVT prophylaxis: SCDs. Code Status: Full code. Family Communication: No family at the bedside. Disposition Plan: To be determined. Consults called: Neurology. Admission status: Observation.   Rise Patience MD [facility] Pager [phone].  If 7PM-7AM, please contact night-coverage [email or web address] Password [email or web address]  09/20/2018, 1:16 AM

## 2018-09-20 NOTE — ED Notes (Signed)
Attempted report x1. 

## 2018-09-20 NOTE — Consult Note (Signed)
NEUROLOGICAL CONSULTATION  Reason for consult: AMS Referring provider: Drs. Tyrone Nine and [place]  HPI: 57/F PMH of Seizures on Vimapt and Topamax, SLE, MS (not known if on DMD), chronic refractory ITP s/p splenectomy, presenting to ER for AMS.  She also has a PMH of HIV, anxiety, chronic pain, DM, HLD. Patient unable to provide reliable history. Per EDP, patient noted to be altered and found to have pneumonia. She is being started on antibiotics for pneumonia but EDP noted that she has some aphasia. MRI brain with and without contrast was ordered for evaluation as she has a documented history of MS. It did not show any enhancement but showed multiple areas of restricted diffusion, not characteristic of strokes in b/l cerebral hemispheres and also in corpus callosum with a broad differential of etiology. Per chart review, she was recently discharged from [facility] for symptomatic iron deficiency anemia requiring transfusions.  PMH as above  Past Surgical History:  Procedure Laterality Date  . ABDOMINAL HYSTERECTOMY   1991    complete  . CARPAL TUNNEL RELEASE   1993-1994    both wrist  . CERVICAL DISCECTOMY   03/25/2017    Dr. Cyndia Diver, [place]  . CESAREAN SECTION   1980  . HERNIA REPAIR   1976  . SPLENECTOMY   09/2016  . TARSAL TUNNEL RELEASE   1993    left ankle  . TUMOR REMOVAL   1993    left thigh    Family history: family history includes COPD in her mother; Cancer in her maternal aunt, mother, and sister; Diabetes in her father; Heart disease in her father; Hyperlipidemia in her father; Hypertension in her father.  Social History: Patient lives with partner.  The patient walks unassisted.  Smoker.  Exam Gen: WD WN disheveled appearing poorly groomed HEENT: [place]uc[place] AT MMM, supple neck CVS: RRR S1S2+ Resp: CTABL Abd: tender all over Ext: no edema, warm well perfused NEUROLOGICAL Awake, alert Able to tell me her name Unable to tell me where she is or what date/month/year it  is Unable to tell me the president Able to name simple objects clearly Able to follow simple commands Unable to follow multistep commands Poor attention/concentration She is mildly dysarthric and also has loss of fluency 'She is not able to provide clear history and keeps perseveration Repetition is intact CN: PERRL. EOMI, does not blink to threat from either side, face symmetric and facial sensation intact, palate and tongue midline Motor: no gross deficits although she has poor attention/concentration so a through strength exam was not possible Sensory: intact to LT all over Coordination intact FNF Gait deferred   Home meds: ADVAIR DISKUS 250-50 MCG/DOSE AEPB, Inhale 1 puff into the lungs 2 times daily., Disp: 3 each, Rfl: 3 .  albuterol (PROVENTIL HFA;VENTOLIN HFA) 108 (90 Base) MCG/ACT inhaler, Inhale 2 puffs into the lungs every 6 (six) hours as needed for wheezing or shortness of breath., Disp: 8.5 g, Rfl: 2 .  AMBULATORY NON FORMULARY MEDICATION, Rolling Walker use as needed.  Disp 1 G35, Disp: 1 each, Rfl: 0 .  carisoprodol (SOMA) 350 MG tablet, Take 350 mg by mouth 3 (three) times daily., Disp: , Rfl:  .  DULoxetine (CYMBALTA) 30 MG capsule, Take 30 mg by mouth 2 (two) times daily., Disp: , Rfl: 11 .  EASY TOUCH PEN NEEDLES 32G X 6 MM MISC, USE AS DIRECTED, Disp: 100 each, Rfl: prn .  ferrous sulfate 325 (65 FE) MG tablet, Take 1 tablet (325 mg total)  by mouth daily., Disp: 30 tablet, Rfl: 0 .  gabapentin (NEURONTIN) 300 MG capsule, Take 1 capsule (300 mg total) by mouth 4 (four) times daily., Disp: 120 capsule, Rfl: 3 .  Insulin Detemir (LEVEMIR FLEXTOUCH) 100 UNIT/ML Pen, Inject 20 Units into the skin 2 (two) times daily., Disp: , Rfl:  .  insulin lispro (HUMALOG KWIKPEN) 100 UNIT/ML KwikPen, Inject 0.15 mLs (15 Units total) into the skin 3 (three) times daily., Disp: 15 mL, Rfl: 3 .  Lacosamide (VIMPAT) 100 MG TABS, Take 100 mg by mouth 2 (two) times daily. , Disp: , Rfl:  .   losartan (COZAAR) 25 MG tablet, Take 1 tablet (25 mg total) by mouth daily., Disp: 90 tablet, Rfl: 1 .  omeprazole (PRILOSEC) 40 MG capsule, Take 1 capsule (40 mg total) by mouth daily., Disp: 90 capsule, Rfl: 3 .  ondansetron (ZOFRAN-ODT) 8 MG disintegrating tablet, Take 1 tablet (8 mg total) by mouth every 8 (eight) hours as needed for nausea or vomiting., Disp: , Rfl:  .  ONE TOUCH ULTRA TEST test strip, USE TO CHECK BLOOD SUGAR THREE TIMES A DAY, Disp: 500 each, Rfl: 11 .  Oxycodone HCl 20 MG TABS, Take 20 mg by mouth every 4 (four) hours as needed (pain). , Disp: , Rfl: 0 .  PHARMACIST CHOICE LANCETS MISC, USE TO CHECK BLOOD SUGAR THREE TIMES A DAY, Disp: 200 each, Rfl: 11 .  potassium chloride (K-DUR,KLOR-CON) 10 MEQ tablet, Take 1 tablet (10 mEq total) by mouth once for 1 dose., Disp: 30 tablet, Rfl: 3 .  QUEtiapine (SEROQUEL) 200 MG tablet, Take 1 tablet (200 mg total) by mouth at bedtime., Disp: 90 tablet, Rfl: 1 .  rosuvastatin (CRESTOR) 20 MG tablet, Take 1 tablet (20 mg total) by mouth daily., Disp: 90 tablet, Rfl: 3 .  SPIRIVA HANDIHALER 18 MCG inhalation capsule, Place 1 capsule into inhaler and inhale daily., Disp: 90 capsule, Rfl: 3 .  sucralfate (CARAFATE) 1 g tablet, Take 1 tablet (1 g total) by mouth 4 (four) times daily -  with meals and at bedtime., Disp: 120 tablet, Rfl: 0 .  triamcinolone cream (KENALOG) 0.1 %, Apply 1 application topically 2 (two) times daily., Disp: 453.6 g, Rfl: 12 .  venlafaxine XR (EFFEXOR-XR) 75 MG 24 hr capsule, Take 1 capsule (75 mg total) by mouth daily with breakfast., Disp: 90 capsule, Rfl: 1   Labs Normal Na, K, Cr, BUN. Mildly elevated glucose 109 WBC 13.8, Hgb 9.1, HCT 32.7 A1c from 08/2018 5.6 CT abd: no acute inflammatory or obstructive process. Left lung pna. UA - pending at the time of dict Utox not ordered/pending at time of dict  MRI brain with and without:  ihave reviewed images independently and my reading is after the official  read  Official reading IMPRESSION: 1. Ill-defined white matter lesions with reduced diffusion are present within the left-greater-than-right inferior frontal gyri as well as the splenium of corpus callosum. The pattern is atypical for ischemic infarction. There is a broad differential, for example transit metabolic derangement, demyelination, seizure, drug toxicity (antiepileptic, metronidazole, sympathomimetic.), ETOH encephalopathy, or hypoglycemia. 2. Background of nonspecific white matter hyperintensities may represent chronic sequelae of the acute process described above or age advanced chronic microvascular ischemic changes.  On my read, I question demyelination to begin with since FLAIR images do not have a periventricular Dawson finger like pattern and the FLAIR lesions are very symmetric pointing to a systemic process. The restricted diffusion is also rather symmetric and does not  follow a vascular terriroty. The splenial lesion can be from many causes including toxic metabolic reasons.  ASSESSMENT: 57/F with PMH as above including that of seizures, MS (although my review of MRI makes me question that diagnosis), chronic pain, HIV,  ITP, SLE anxiety and polypharmacy brought in for evaluation of AMS. I spoke with the EDP and recommended MRI brain with and without due to documented h/o MS, but I do not think this is straight forward MS due to my read of MRI above. This pattern of non specific encephalopathic clincal picture and non specific MRI pattern is more in line with toxic metabolic patterns possibly from underlying multiple sedating medications and possibly seizures  Recs: -Admit to medicine -Check thiamine level prior to supplementing thiamine. -Reduce sedating meds as much as possibl.e -Continue vimpat (same dose IV as PO) -Can not do Toapamax IV, but documented dose is low, so will hold for now. -EEG in the AM -Check for other sources of infection (has presumed pneumonia, which  might explain acute decompensation) but check UA and also check U tox. -Get records from outside neurologist - could not find any in our system and [facility] - if no other source is identified, may need a LP   -- Amie Portland, MD [facility] Pager: [phone] If 7pm to 7am, please call on call as listed on AMION.

## 2018-09-20 NOTE — Progress Notes (Signed)
EEG Completed; Results Pending  

## 2018-09-20 NOTE — Procedures (Signed)
ELECTROENCEPHALOGRAM REPORT   Patient: Amber Faulkner       Room #: 9S49Q EEG No. ID: [id number] Age: 58 y.o.        Sex: female Referring Physician: Ghimire Report Date:  09/20/2018        Interpreting Physician: Alexis Goodell  History: Amber Faulkner is an 58 y.o. female with altered mental status  Medications:  Insulin, Vimpat, Flagyl, Cipro  Conditions of Recording:  This is a 21 channel routine scalp EEG performed with bipolar and monopolar montages arranged in accordance to the international 10/20 system of electrode placement. One channel was dedicated to EKG recording.  The patient is in the awake and confused state.  Description:  The background activity is slow and poorly organized.  It consists of low voltage, polymorphic delta activity that is continuous and diffusely distributed.  There are frequent periodic discharges of triphasic morphology that are seen bihemispherically, but more common over the left hemisphere.    Hyperventilation and intermittent photic stimulation were not performed.   IMPRESSION: This is an abnormal electroencephalogram secondary to general background slowing and bihemispheric periodic discharges of triphasic morphology (left greater than right). These are seen most commonly in encephalopathic states, although can not rule out other possibilities such as seizure.  Clinical correlation recommended.          Alexis Goodell, MD Neurology [phone] 09/20/2018, 2:29 PM

## 2018-09-20 NOTE — Progress Notes (Signed)
Pharmacy Antibiotic Note  Amber Faulkner is a 58 y.o. female admitted on 09/19/2018 with altered mental status, ?intra-abdominal infection.  Pharmacy has been consulted for Cipro dosing. WBC 13.8. Renal function good.   Plan: Cipro 400 mg IV q12h Flagyl per MD Trend WBC, temp, renal function  F/U infectious work-up   Height: 5\' 4"  (162.6 cm) Weight: 178 lb (80.7 kg) IBW/kg (Calculated) : 54.7  Temp (24hrs), Avg:98.3 F (36.8 C), Min:97.9 F (36.6 C), Max:98.6 F (37 C)  Recent Labs  Lab 09/19/18 1949 09/19/18 2311  WBC 13.8*  --   CREATININE 0.67  --   LATICACIDVEN 1.0 0.9    Estimated Creatinine Clearance: 81.7 mL/min (by C-G formula based on SCr of 0.67 mg/dL).    Allergies  Allergen Reactions  . Cephalosporins Anaphylaxis  . Vancomycin Anaphylaxis  . Penicillins Hives and Rash  . Ampicillin Other (See Comments)    Other reaction(s): anaphylaxis  . Carisoprodol Other (See Comments)    Other reaction(s): seizures  . Lisinopril Other (See Comments)    Nausea,dizziness,headache  . Penicillin G Other (See Comments)    Other reaction(s): anaphylaxis    Narda Bonds 09/20/2018 5:14 AM

## 2018-09-21 ENCOUNTER — Encounter (HOSPITAL_COMMUNITY): Payer: Self-pay | Admitting: General Practice

## 2018-09-21 DIAGNOSIS — E876 Hypokalemia: Secondary | ICD-10-CM

## 2018-09-21 DIAGNOSIS — I10 Essential (primary) hypertension: Secondary | ICD-10-CM

## 2018-09-21 DIAGNOSIS — G40909 Epilepsy, unspecified, not intractable, without status epilepticus: Secondary | ICD-10-CM

## 2018-09-21 DIAGNOSIS — J449 Chronic obstructive pulmonary disease, unspecified: Secondary | ICD-10-CM

## 2018-09-21 LAB — FOLATE: Folate: 18.1 ng/mL

## 2018-09-21 LAB — HEPATITIS PANEL, ACUTE
HCV Ab: >11 s/co ratio — ABNORMAL HIGH (ref 0.0–0.9)
HCV Ab: >11 s/co ratio — ABNORMAL HIGH (ref 0.0–0.9)
HEP B S AG: NEGATIVE
Hep A IgM: NEGATIVE
Hep A IgM: NEGATIVE
Hep B C IgM: NEGATIVE
Hep B C IgM: NEGATIVE
Hepatitis B Surface Ag: NEGATIVE

## 2018-09-21 LAB — COMPREHENSIVE METABOLIC PANEL
ALT: 462 U/L — ABNORMAL HIGH (ref 0–44)
AST: 184 U/L — AB (ref 15–41)
Albumin: 3.1 g/dL — ABNORMAL LOW (ref 3.5–5.0)
Alkaline Phosphatase: 112 U/L (ref 38–126)
Anion gap: 12 (ref 5–15)
BUN: 13 mg/dL (ref 6–20)
CO2: 21 mmol/L — ABNORMAL LOW (ref 22–32)
Calcium: 8.4 mg/dL — ABNORMAL LOW (ref 8.9–10.3)
Chloride: 105 mmol/L (ref 98–111)
Creatinine, Ser: 0.64 mg/dL (ref 0.44–1.00)
GFR calc Af Amer: >60 mL/min (ref >60)
GFR calc non Af Amer: >60 mL/min (ref >60)
Glucose, Bld: 136 mg/dL — ABNORMAL HIGH (ref 70–99)
POTASSIUM: 3.2 mmol/L — AB (ref 3.5–5.1)
Sodium: 138 mmol/L (ref 135–145)
Total Bilirubin: 1.1 mg/dL (ref 0.3–1.2)
Total Protein: 6 g/dL — ABNORMAL LOW (ref 6.5–8.1)

## 2018-09-21 LAB — CBC WITH DIFFERENTIAL/PLATELET
Band Neutrophils: 0 %
Basophils Absolute: 0.2 10*3/uL — ABNORMAL HIGH (ref 0.0–0.1)
Basophils Relative: 1 %
Blasts: 0 %
Eosinophils Absolute: 0.2 10*3/uL (ref 0.0–0.5)
Eosinophils Relative: 1 %
HEMATOCRIT: 31.3 % — AB (ref 36.0–46.0)
Hemoglobin: 9.2 g/dL — ABNORMAL LOW (ref 12.0–15.0)
LYMPHS PCT: 12 %
Lymphs Abs: 1.9 10*3/uL (ref 0.7–4.0)
MCH: 21.9 pg — ABNORMAL LOW (ref 26.0–34.0)
MCHC: 29.4 g/dL — ABNORMAL LOW (ref 30.0–36.0)
MCV: 74.5 fL — ABNORMAL LOW (ref 80.0–100.0)
Metamyelocytes Relative: 0 %
Monocytes Absolute: 2.8 10*3/uL — ABNORMAL HIGH (ref 0.1–1.0)
Monocytes Relative: 18 %
Myelocytes: 0 %
Neutro Abs: 10.5 10*3/uL — ABNORMAL HIGH (ref 1.7–7.7)
Neutrophils Relative %: 68 %
Other: 0 %
Platelets: 106 10*3/uL — ABNORMAL LOW (ref 150–400)
Promyelocytes Relative: 0 %
RBC: 4.2 MIL/uL (ref 3.87–5.11)
RDW: 25.9 % — ABNORMAL HIGH (ref 11.5–15.5)
WBC: 15.6 10*3/uL — ABNORMAL HIGH (ref 4.0–10.5)
nRBC: 0 /100 WBC
nRBC: 9.4 % — ABNORMAL HIGH (ref 0.0–0.2)

## 2018-09-21 LAB — VITAMIN B12: Vitamin B-12: 616 pg/mL (ref 180–914)

## 2018-09-21 LAB — GLUCOSE, CAPILLARY
Glucose-Capillary: 138 mg/dL — ABNORMAL HIGH (ref 70–99)
Glucose-Capillary: 173 mg/dL — ABNORMAL HIGH (ref 70–99)
Glucose-Capillary: 134 mg/dL — ABNORMAL HIGH (ref 70–99)
Glucose-Capillary: 160 mg/dL — ABNORMAL HIGH (ref 70–99)
Glucose-Capillary: 204 mg/dL — ABNORMAL HIGH (ref 70–99)
Glucose-Capillary: 216 mg/dL — ABNORMAL HIGH (ref 70–99)

## 2018-09-21 LAB — RETICULOCYTES
Immature Retic Fract: 31.5 % — ABNORMAL HIGH (ref 2.3–15.9)
RBC.: 4.4 MIL/uL (ref 3.87–5.11)
Retic Count, Absolute: 27.6 10*3/uL (ref 19.0–186.0)
Retic Ct Pct: 0.6 % (ref 0.4–3.1)

## 2018-09-21 LAB — FERRITIN: Ferritin: 28 ng/mL (ref 11–307)

## 2018-09-21 LAB — URINE CULTURE: Culture: NO GROWTH

## 2018-09-21 LAB — IRON AND TIBC
Iron: 13 ug/dL — ABNORMAL LOW (ref 28–170)
Saturation Ratios: 3 % — ABNORMAL LOW (ref 10.4–31.8)
TIBC: 417 ug/dL (ref 250–450)
UIBC: 404 ug/dL

## 2018-09-21 LAB — MAGNESIUM: Magnesium: 1.7 mg/dL (ref 1.7–2.4)

## 2018-09-21 LAB — PHOSPHORUS: Phosphorus: 2.4 mg/dL — ABNORMAL LOW (ref 2.5–4.6)

## 2018-09-21 LAB — TSH: TSH: 2.667 u[IU]/mL (ref 0.350–4.500)

## 2018-09-21 LAB — HIV ANTIBODY (ROUTINE TESTING W REFLEX): HIV Screen 4th Generation wRfx: NONREACTIVE

## 2018-09-21 MED ORDER — INSULIN DETEMIR 100 UNIT/ML ~~LOC~~ SOLN
10.0000 [IU] | Freq: Two times a day (BID) | SUBCUTANEOUS | Status: DC
  Administered 2018-09-21 – 2018-09-26 (×10): 10 [IU] via SUBCUTANEOUS
  Filled 2018-09-21 (×11): qty 0.1

## 2018-09-21 MED ORDER — VITAMIN B-12 1000 MCG PO TABS
1000.0000 ug | ORAL_TABLET | Freq: Every day | ORAL | Status: DC
  Administered 2018-09-21 – 2018-09-26 (×6): 1000 ug via ORAL
  Filled 2018-09-21 (×6): qty 1

## 2018-09-21 MED ORDER — PANTOPRAZOLE SODIUM 40 MG PO TBEC
40.0000 mg | DELAYED_RELEASE_TABLET | Freq: Every day | ORAL | Status: DC
  Administered 2018-09-21 – 2018-09-26 (×6): 40 mg via ORAL
  Filled 2018-09-21 (×6): qty 1

## 2018-09-21 MED ORDER — FERROUS SULFATE 325 (65 FE) MG PO TABS
325.0000 mg | ORAL_TABLET | Freq: Every day | ORAL | Status: DC
  Administered 2018-09-21 – 2018-09-26 (×6): 325 mg via ORAL
  Filled 2018-09-21 (×6): qty 1

## 2018-09-21 MED ORDER — POTASSIUM CHLORIDE CRYS ER 20 MEQ PO TBCR
40.0000 meq | EXTENDED_RELEASE_TABLET | Freq: Two times a day (BID) | ORAL | Status: AC
  Administered 2018-09-21 (×2): 40 meq via ORAL
  Filled 2018-09-21 (×2): qty 2

## 2018-09-21 MED ORDER — VITAMIN D 25 MCG (1000 UNIT) PO TABS
1000.0000 [IU] | ORAL_TABLET | Freq: Every day | ORAL | Status: DC
  Administered 2018-09-21 – 2018-09-26 (×6): 1000 [IU] via ORAL
  Filled 2018-09-21 (×6): qty 1

## 2018-09-21 MED ORDER — IBUPROFEN 200 MG PO TABS
200.0000 mg | ORAL_TABLET | Freq: Four times a day (QID) | ORAL | Status: DC | PRN
  Administered 2018-09-21: 200 mg via ORAL
  Filled 2018-09-21: qty 1

## 2018-09-21 MED ORDER — INSULIN ASPART 100 UNIT/ML ~~LOC~~ SOLN
0.0000 [IU] | Freq: Three times a day (TID) | SUBCUTANEOUS | Status: DC
  Administered 2018-09-22: 3 [IU] via SUBCUTANEOUS
  Administered 2018-09-23 (×2): 2 [IU] via SUBCUTANEOUS
  Administered 2018-09-24: 5 [IU] via SUBCUTANEOUS
  Administered 2018-09-24: 1 [IU] via SUBCUTANEOUS
  Administered 2018-09-24: 5 [IU] via SUBCUTANEOUS
  Administered 2018-09-25 (×2): 2 [IU] via SUBCUTANEOUS

## 2018-09-21 MED ORDER — LACOSAMIDE 50 MG PO TABS
100.0000 mg | ORAL_TABLET | Freq: Two times a day (BID) | ORAL | Status: DC
  Administered 2018-09-21 – 2018-09-26 (×10): 100 mg via ORAL
  Filled 2018-09-21 (×10): qty 2

## 2018-09-21 MED ORDER — IPRATROPIUM-ALBUTEROL 0.5-2.5 (3) MG/3ML IN SOLN
3.0000 mL | Freq: Three times a day (TID) | RESPIRATORY_TRACT | Status: DC
  Administered 2018-09-22: 3 mL via RESPIRATORY_TRACT
  Filled 2018-09-21: qty 3

## 2018-09-21 MED ORDER — TRAMADOL HCL 50 MG PO TABS
50.0000 mg | ORAL_TABLET | Freq: Two times a day (BID) | ORAL | Status: DC | PRN
  Administered 2018-09-21 – 2018-09-23 (×4): 50 mg via ORAL
  Filled 2018-09-21 (×5): qty 1

## 2018-09-21 MED ORDER — KETOROLAC TROMETHAMINE 30 MG/ML IJ SOLN
30.0000 mg | Freq: Once | INTRAMUSCULAR | Status: AC
  Administered 2018-09-21: 30 mg via INTRAVENOUS
  Filled 2018-09-21: qty 1

## 2018-09-21 MED ORDER — INSULIN ASPART 100 UNIT/ML ~~LOC~~ SOLN
0.0000 [IU] | Freq: Every day | SUBCUTANEOUS | Status: DC
  Administered 2018-09-21: 2 [IU] via SUBCUTANEOUS
  Administered 2018-09-23: 3 [IU] via SUBCUTANEOUS

## 2018-09-21 MED ORDER — K PHOS MONO-SOD PHOS DI & MONO 155-852-130 MG PO TABS
500.0000 mg | ORAL_TABLET | Freq: Two times a day (BID) | ORAL | Status: AC
  Administered 2018-09-21 – 2018-09-22 (×2): 500 mg via ORAL
  Filled 2018-09-21 (×2): qty 2

## 2018-09-21 NOTE — Progress Notes (Addendum)
Subjective: Patient feels that she is back to baseline. Has no memory of the past few days. Thinks that she may have had a seizure at home, but also thinks it is plausible that she may have had a severe hypoglycemic episode, as she has had 2 such episodes in the remote past. She is on insulin at home.   Objective: Current vital signs: BP 112/60 (BP Location: Left Arm)    Pulse 80    Temp 98.5 F (36.9 C) (Oral)    Resp 17    Ht 5\' 5"  (1.651 m)    Wt 79.5 kg    SpO2 97%    BMI 29.17 kg/m  Vital signs in last 24 hours: Temp:  [98 F (36.7 C)-98.5 F (36.9 C)] 98.5 F (36.9 C) (03/12 2336) Pulse Rate:  [76-80] 80 (03/12 2336) Resp:  [17-19] 17 (03/12 2336) BP: (111-155)/(60-85) 112/60 (03/12 2336) SpO2:  [90 %-97 %] 97 % (03/13 0120)  Intake/Output from previous day: 03/12 0701 - 03/13 0700 In: 435 [IV Piggyback:435] Out: -  Intake/Output this shift: No intake/output data recorded. Nutritional status:  Diet Order            Diet regular Room service appropriate? Yes; Fluid consistency: Thin  Diet effective now              Neurologic Exam: Ment: Alert and oriented. Speech fluent with intact comprehension and naming. No dysarthria.  CN: EOMI without nystagmus. PERRL. Visual fields intact. No nystagmus. Face symmetric. Phonation intact.  Motor: 5/5 x 4 without asymmetry. No pronator drift.  Sensory: Intact to FT x 4 Reflexes: 2+ bilateral upper extremities and patellae Cerebellar: No ataxia with FNF bilaterally   Lab Results: Results for orders placed or performed during the hospital encounter of 09/19/18 (from the past 48 hour(s))  CBG monitoring, ED     Status: None   Collection Time: 09/19/18  6:44 PM  Result Value Ref Range   Glucose-Capillary 96 70 - 99 mg/dL   Comment 1 Notify RN    Comment 2 Document in Chart   Ammonia     Status: None   Collection Time: 09/19/18  7:49 PM  Result Value Ref Range   Ammonia 14 9 - 35 umol/L    Comment: Performed at [facility], [place] [address], [place], [place] [address]  Comprehensive metabolic panel     Status: Abnormal   Collection Time: 09/19/18  7:49 PM  Result Value Ref Range   Sodium 139 135 - 145 mmol/L   Potassium 3.7 3.5 - 5.1 mmol/L   Chloride 103 98 - 111 mmol/L   CO2 25 22 - 32 mmol/L   Glucose, Bld 109 (H) 70 - 99 mg/dL   BUN 20 6 - 20 mg/dL   Creatinine, Ser 0.67 0.44 - 1.00 mg/dL   Calcium 8.3 (L) 8.9 - 10.3 mg/dL   Total Protein 6.1 (L) 6.5 - 8.1 g/dL   Albumin 3.3 (L) 3.5 - 5.0 g/dL   AST 407 (H) 15 - 41 U/L   ALT 593 (H) 0 - 44 U/L   Alkaline Phosphatase 120 38 - 126 U/L   Total Bilirubin 1.0 0.3 - 1.2 mg/dL   GFR calc non Af Amer >60 >60 mL/min   GFR calc Af Amer >60 >60 mL/min   Anion gap 11 5 - 15    Comment: Performed at [facility], [place] [address] [place], [place] [address]  Ethanol  Status: None   Collection Time: 09/19/18  7:49 PM  Result Value Ref Range   Alcohol, Ethyl (B) <10 <10 mg/dL    Comment: (NOTE) Lowest detectable limit for serum alcohol is 10 mg/dL. For medical purposes only. Performed at [facility], [place] [address], [place], [place] [address]   CBC WITH DIFFERENTIAL     Status: Abnormal   Collection Time: 09/19/18  7:49 PM  Result Value Ref Range   WBC 13.8 (H) 4.0 - 10.5 K/uL   RBC 4.28 3.87 - 5.11 MIL/uL   Hemoglobin 9.1 (L) 12.0 - 15.0 g/dL    Comment: REPEATED TO VERIFY   HCT 32.7 (L) 36.0 - 46.0 %   MCV 76.4 (L) 80.0 - 100.0 fL   MCH 21.3 (L) 26.0 - 34.0 pg   MCHC 27.8 (L) 30.0 - 36.0 g/dL   RDW 26.5 (H) 11.5 - 15.5 %   Platelets 150 150 - 400 K/uL    Comment: REPEATED TO VERIFY   nRBC 11.0 (H) 0.0 - 0.2 %   Neutrophils Relative % 69 %   Neutro Abs 9.5 (H) 1.7 - 7.7 K/uL   Lymphocytes Relative 12 %   Lymphs Abs 1.7 0.7 - 4.0 K/uL   Monocytes Relative 16 %   Monocytes Absolute 2.2 (H) 0.1 - 1.0 K/uL   Eosinophils Relative 0 %   Eosinophils Absolute 0.0 0.0 - 0.5 K/uL   Basophils Relative 1 %   Basophils Absolute 0.1  0.0 - 0.1 K/uL   Immature Granulocytes 2 %   Abs Immature Granulocytes 0.22 (H) 0.00 - 0.07 K/uL   Tear Drop Cells PRESENT    Tammy Sours Bodies PRESENT    Polychromasia PRESENT    Target Cells PRESENT     Comment: Performed at [facility], [place] [address] [place], [place] [address]  Lipase, blood     Status: None   Collection Time: 09/19/18  7:49 PM  Result Value Ref Range   Lipase 17 11 - 51 U/L    Comment: Performed at [facility] [address], [place], [place] [address]  Lactic acid, plasma     Status: None   Collection Time: 09/19/18  7:49 PM  Result Value Ref Range   Lactic Acid, Venous 1.0 0.5 - 1.9 mmol/L    Comment: Performed at [facility] [address], [place], [place] [address]  Pathologist smear review     Status: None   Collection Time: 09/19/18  7:49 PM  Result Value Ref Range   Path Review Leukocytosis     Comment: Microcytosis, low hemoglobin/hematocrit, yet RBC count is normal Reviewed by Charolett Bumpers. Jeannie Done, M.D. 09/20/2018 Performed at [facility], [place] [address] [place], [place] [address]   I-Stat beta hCG blood, ED     Status: None   Collection Time: 09/19/18  8:07 PM  Result Value Ref Range   I-stat hCG, quantitative <5.0 <5 mIU/mL   Comment 3            Comment:   GEST. AGE      CONC.  (mIU/mL)   <=1 WEEK        5 - 50     2 WEEKS       50 - 500     3 WEEKS       100 - 10,000     4 WEEKS     1,000 - 30,000  FEMALE AND NON-PREGNANT FEMALE:     LESS THAN 5 mIU/mL   POCT I-Stat EG7     Status: Abnormal   Collection Time: 09/19/18  8:07 PM  Result Value Ref Range   pH, Ven 7.429 7.250 - 7.430   pCO2, Ven 39.6 (L) 44.0 - 60.0 mmHg   pO2, Ven 92.0 (H) 32.0 - 45.0 mmHg   Bicarbonate 26.2 20.0 - 28.0 mmol/L   TCO2 27 22 - 32 mmol/L   O2 Saturation 97.0 %   Acid-Base Excess 2.0 0.0 - 2.0 mmol/L   Sodium 138 135 - 145 mmol/L   Potassium 3.9 3.5 - 5.1 mmol/L   Calcium, Ion 1.04 (L) 1.15 - 1.40 mmol/L   HCT 32.0  (L) 36.0 - 46.0 %   Hemoglobin 10.9 (L) 12.0 - 15.0 g/dL   Patient temperature HIDE    Sample type VENOUS   Urinalysis, Routine w reflex microscopic (not at [facility])     Status: Abnormal   Collection Time: 09/19/18  9:21 PM  Result Value Ref Range   Color, Urine YELLOW YELLOW   APPearance CLEAR CLEAR   Specific Gravity, Urine >1.046 (H) 1.005 - 1.030   pH 7.0 5.0 - 8.0   Glucose, UA NEGATIVE NEGATIVE mg/dL   Hgb urine dipstick LARGE (A) NEGATIVE   Bilirubin Urine NEGATIVE NEGATIVE   Ketones, ur 80 (A) NEGATIVE mg/dL   Protein, ur 30 (A) NEGATIVE mg/dL   Nitrite NEGATIVE NEGATIVE   Leukocytes,Ua NEGATIVE NEGATIVE   RBC / HPF >50 (H) 0 - 5 RBC/hpf   WBC, UA 6-10 0 - 5 WBC/hpf   Bacteria, UA NONE SEEN NONE SEEN   Squamous Epithelial / LPF 0-5 0 - 5   Mucus PRESENT     Comment: Performed at [facility], [address], [place], [place] [address]  Lactic acid, plasma     Status: None   Collection Time: 09/19/18 11:11 PM  Result Value Ref Range   Lactic Acid, Venous 0.9 0.5 - 1.9 mmol/L    Comment: Performed at [facility] [address], [place], [place] [address]  Hepatitis panel, acute     Status: Abnormal   Collection Time: 09/19/18 11:11 PM  Result Value Ref Range   Hepatitis B Surface Ag Negative Negative   HCV Ab >11.0 (H) 0.0 - 0.9 s/co ratio    Comment: (NOTE)                                  Negative:     < 0.8                             Indeterminate: 0.8 - 0.9                                  Positive:     > 0.9 The CDC recommends that a positive HCV antibody result be followed up with a HCV Nucleic Acid Amplification test (443154). Performed At: [facility] [place], [place] [id number] Rush Farmer MD [id number]    Hep A IgM Negative Negative   Hep B C IgM Negative Negative  MRSA PCR Screening     Status: None   Collection Time: 09/20/18  2:10 AM  Result Value Ref Range   MRSA by  PCR NEGATIVE NEGATIVE    Comment:         The GeneXpert MRSA Assay (FDA approved for NASAL specimens only), is one component of a comprehensive MRSA colonization surveillance program. It is not intended to diagnose MRSA infection nor to guide or monitor treatment for MRSA infections. Performed at [facility], [place] [address] [place], [place] [address]   Basic metabolic panel     Status: Abnormal   Collection Time: 09/20/18  5:01 AM  Result Value Ref Range   Sodium 139 135 - 145 mmol/L   Potassium 3.8 3.5 - 5.1 mmol/L   Chloride 101 98 - 111 mmol/L   CO2 24 22 - 32 mmol/L   Glucose, Bld 105 (H) 70 - 99 mg/dL   BUN 17 6 - 20 mg/dL   Creatinine, Ser 0.75 0.44 - 1.00 mg/dL   Calcium 8.5 (L) 8.9 - 10.3 mg/dL   GFR calc non Af Amer >60 >60 mL/min   GFR calc Af Amer >60 >60 mL/min   Anion gap 14 5 - 15    Comment: Performed at [facility], [place] [address] [place], [place] [address]  Hepatic function panel     Status: Abnormal   Collection Time: 09/20/18  5:01 AM  Result Value Ref Range   Total Protein 6.3 (L) 6.5 - 8.1 g/dL   Albumin 3.2 (L) 3.5 - 5.0 g/dL   AST 323 (H) 15 - 41 U/L   ALT 564 (H) 0 - 44 U/L   Alkaline Phosphatase 118 38 - 126 U/L   Total Bilirubin 1.2 0.3 - 1.2 mg/dL   Bilirubin, Direct 0.3 (H) 0.0 - 0.2 mg/dL   Indirect Bilirubin 0.9 0.3 - 0.9 mg/dL    Comment: Performed at [facility] [address] [place], [place] [address]  CBC WITH DIFFERENTIAL     Status: Abnormal   Collection Time: 09/20/18  5:01 AM  Result Value Ref Range   WBC 14.5 (H) 4.0 - 10.5 K/uL   RBC 4.07 3.87 - 5.11 MIL/uL   Hemoglobin 8.8 (L) 12.0 - 15.0 g/dL    Comment: Reticulocyte Hemoglobin testing may be clinically indicated, consider ordering this additional test [id number]    HCT 30.9 (L) 36.0 - 46.0 %   MCV 75.9 (L) 80.0 - 100.0 fL   MCH 21.6 (L) 26.0 - 34.0 pg   MCHC 28.5 (L) 30.0 - 36.0 g/dL   RDW 26.7 (H) 11.5 - 15.5 %   Platelets PLATELET CLUMPS NOTED ON SMEAR, UNABLE TO ESTIMATE 150 - 400  K/uL    Comment: PLATELET CLUMPING, SUGGEST RECOLLECTION OF SAMPLE IN CITRATE TUBE. REPEATED TO VERIFY    nRBC 10.5 (H) 0.0 - 0.2 %   Neutrophils Relative % 64 %   Neutro Abs 9.3 (H) 1.7 - 7.7 K/uL   Lymphocytes Relative 13 %   Lymphs Abs 2.0 0.7 - 4.0 K/uL   Monocytes Relative 20 %   Monocytes Absolute 2.8 (H) 0.1 - 1.0 K/uL   Eosinophils Relative 1 %   Eosinophils Absolute 0.2 0.0 - 0.5 K/uL   Basophils Relative 1 %   Basophils Absolute 0.1 0.0 - 0.1 K/uL   Immature Granulocytes 1 %   Abs Immature Granulocytes 0.20 (H) 0.00 - 0.07 K/uL    Comment: Performed at [facility] [address] [place], [place] [address]  Glucose, capillary     Status: Abnormal   Collection Time: 09/20/18  7:29 AM  Result  Value Ref Range   Glucose-Capillary 110 (H) 70 - 99 mg/dL  HIV Antibody (routine testing w rflx)     Status: None   Collection Time: 09/20/18 11:10 AM  Result Value Ref Range   HIV Screen 4th Generation wRfx Non Reactive Non Reactive    Comment: (NOTE) Performed At: [facility] [place], [place] [id number] Rush Farmer MD [id number]   Glucose, capillary     Status: Abnormal   Collection Time: 09/20/18 12:11 PM  Result Value Ref Range   Glucose-Capillary 115 (H) 70 - 99 mg/dL  Glucose, capillary     Status: Abnormal   Collection Time: 09/20/18  4:24 PM  Result Value Ref Range   Glucose-Capillary 103 (H) 70 - 99 mg/dL  Acetaminophen level     Status: Abnormal   Collection Time: 09/20/18  7:05 PM  Result Value Ref Range   Acetaminophen (Tylenol), Serum <10 (L) 10 - 30 ug/mL    Comment: Performed at [facility], [place] [address], [place], [place] [address]  Glucose, capillary     Status: Abnormal   Collection Time: 09/20/18  7:24 PM  Result Value Ref Range   Glucose-Capillary 106 (H) 70 - 99 mg/dL  Glucose, capillary     Status: Abnormal   Collection Time: 09/20/18 11:31 PM  Result Value Ref Range   Glucose-Capillary 112 (H) 70 - 99  mg/dL  Comprehensive metabolic panel     Status: Abnormal   Collection Time: 09/21/18  3:25 AM  Result Value Ref Range   Sodium 138 135 - 145 mmol/L   Potassium 3.2 (L) 3.5 - 5.1 mmol/L   Chloride 105 98 - 111 mmol/L   CO2 21 (L) 22 - 32 mmol/L   Glucose, Bld 136 (H) 70 - 99 mg/dL   BUN 13 6 - 20 mg/dL   Creatinine, Ser 0.64 0.44 - 1.00 mg/dL   Calcium 8.4 (L) 8.9 - 10.3 mg/dL   Total Protein 6.0 (L) 6.5 - 8.1 g/dL   Albumin 3.1 (L) 3.5 - 5.0 g/dL   AST 184 (H) 15 - 41 U/L   ALT 462 (H) 0 - 44 U/L   Alkaline Phosphatase 112 38 - 126 U/L   Total Bilirubin 1.1 0.3 - 1.2 mg/dL   GFR calc non Af Amer >60 >60 mL/min   GFR calc Af Amer >60 >60 mL/min   Anion gap 12 5 - 15    Comment: Performed at [facility], [place] [address], [place], [place] [address]  CBC with Differential/Platelet     Status: Abnormal   Collection Time: 09/21/18  3:25 AM  Result Value Ref Range   WBC 15.6 (H) 4.0 - 10.5 K/uL    Comment: WHITE COUNT CONFIRMED ON SMEAR   RBC 4.20 3.87 - 5.11 MIL/uL   Hemoglobin 9.2 (L) 12.0 - 15.0 g/dL   HCT 31.3 (L) 36.0 - 46.0 %   MCV 74.5 (L) 80.0 - 100.0 fL   MCH 21.9 (L) 26.0 - 34.0 pg   MCHC 29.4 (L) 30.0 - 36.0 g/dL   RDW 25.9 (H) 11.5 - 15.5 %   Platelets 106 (L) 150 - 400 K/uL    Comment: REPEATED TO VERIFY PLATELET COUNT CONFIRMED BY SMEAR Immature Platelet Fraction may be clinically indicated, consider ordering this additional test [id number]    nRBC 9.4 (H) 0.0 - 0.2 %   Neutrophils Relative % 68 %   Lymphocytes Relative 12 %   Monocytes Relative 18 %  Eosinophils Relative 1 %   Basophils Relative 1 %   Band Neutrophils 0 %   Metamyelocytes Relative 0 %   Myelocytes 0 %   Promyelocytes Relative 0 %   Blasts 0 %   nRBC 0 0 /100 WBC   Other 0 %   Neutro Abs 10.5 (H) 1.7 - 7.7 K/uL   Lymphs Abs 1.9 0.7 - 4.0 K/uL   Monocytes Absolute 2.8 (H) 0.1 - 1.0 K/uL   Eosinophils Absolute 0.2 0.0 - 0.5 K/uL   Basophils Absolute 0.2 (H) 0.0 - 0.1 K/uL   RBC  Morphology RARE NRBCs     Comment: [employer] Performed at [facility] [address] [place], [place] [address]   Glucose, capillary     Status: Abnormal   Collection Time: 09/21/18  4:05 AM  Result Value Ref Range   Glucose-Capillary 138 (H) 70 - 99 mg/dL    Recent Results (from the past 240 hour(s))  MRSA PCR Screening     Status: None   Collection Time: 09/20/18  2:10 AM  Result Value Ref Range Status   MRSA by PCR NEGATIVE NEGATIVE Final    Comment:        The GeneXpert MRSA Assay (FDA approved for NASAL specimens only), is one component of a comprehensive MRSA colonization surveillance program. It is not intended to diagnose MRSA infection nor to guide or monitor treatment for MRSA infections. Performed at [facility], [place] [address] [place], [place] [address]     Lipid Panel No results for input(s): CHOL, TRIG, HDL, CHOLHDL, VLDL, LDLCALC in the last 72 hours.  Studies/Results: Ct Head Wo Contrast  Result Date: 09/19/2018 CLINICAL DATA:  58 year old female with increased confusion over the past week, found down today. EXAM: CT HEAD WITHOUT CONTRAST TECHNIQUE: Contiguous axial images were obtained from the base of the skull through the vertex without intravenous contrast. COMPARISON:  PET-CT 01/06/2015. FINDINGS: Brain: Normal cerebral volume. No midline shift, ventriculomegaly, mass effect, evidence of mass lesion, intracranial hemorrhage or evidence of cortically based acute infarction. Possible small chronic infarct in the right cerebellar hemisphere on series 3, image 7. Mild scattered cerebral white matter hypodensity. Vascular: Calcified atherosclerosis at the skull base. No suspicious intracranial vascular hyperdensity. Skull: No acute osseous abnormality identified. Sinuses/Orbits: Visualized paranasal sinuses and mastoids are well pneumatized. Minor mucosal thickening in the left maxillary sinus. Other: Negative orbits. Visualized scalp soft  tissues are within normal limits. IMPRESSION: 1. No acute intracranial abnormality. 2. Possible small chronic right cerebellar infarct. Mild nonspecific white matter changes most commonly due to small vessel disease. Electronically Signed   By: Genevie Ann M.D.   On: 09/19/2018 19:48   Mr Jeri Cos And Wo Contrast  Result Date: 09/20/2018 CLINICAL DATA:  58 y/o F; increased confusion over the last week. Found on floor today. EXAM: MRI HEAD WITHOUT AND WITH CONTRAST TECHNIQUE: Multiplanar, multiecho pulse sequences of the brain and surrounding structures were obtained without and with intravenous contrast. CONTRAST:  7.5 cc Gadavist COMPARISON:  09/19/2018 CT head. FINDINGS: Brain: Ill-defined foci of reduced diffusion are present within the right greater than left centrum semiovale a posterior aspect of inferior frontal gyrus as well as the splenium of corpus callosum (series 7, image 49). No associated hemorrhage or mass effect. There are several foci of T2 FLAIR hyperintense signal abnormality within subcortical and periventricular white matter, left greater than right posterior limb of internal capsule,, body of corpus callosum, and the right  cerebellar hemisphere. There are a few punctate foci of susceptibility hypointensity scattered in a nonspecific distribution throughout the bilateral frontal and parietal lobes compatible with hemosiderin deposition of chronic microhemorrhage. No extra-axial collection, hydrocephalus, mass effect, or herniation. After administration of intravenous contrast there is no abnormal enhancement. Vascular: Normal flow voids. Skull and upper cervical spine: Normal marrow signal. Sinuses/Orbits: Mild paranasal sinus mucosal thickening greatest in the left maxillary sinus. No abnormal signal of mastoid air cells. Orbits are unremarkable. Other: None. IMPRESSION: 1. Ill-defined white matter lesions with reduced diffusion are present within the left-greater-than-right inferior frontal  gyri as well as the splenium of corpus callosum. The pattern is atypical for ischemic infarction. There is a broad differential, for example transit metabolic derangement, demyelination, seizure, drug toxicity (antiepileptic, metronidazole, sympathomimetic.), ETOH encephalopathy, or hypoglycemia. 2. Background of nonspecific white matter hyperintensities may represent chronic sequelae of the acute process described above or age advanced chronic microvascular ischemic changes. Electronically Signed   By: Kristine Garbe M.D.   On: 09/20/2018 00:34   Ct Abdomen Pelvis W Contrast  Result Date: 09/19/2018 CLINICAL DATA:  Right upper quadrant pain. EXAM: CT ABDOMEN AND PELVIS WITH CONTRAST TECHNIQUE: Multidetector CT imaging of the abdomen and pelvis was performed using the standard protocol following bolus administration of intravenous contrast. CONTRAST:  142mL OMNIPAQUE IOHEXOL 300 MG/ML  SOLN COMPARISON:  Head CT 01/06/2015 FINDINGS: There is residual contrast material demonstrated throughout the colon. Streak artifact arising from this dense contrast material limits the examination in some areas. Lower chest: Emphysematous changes and linear fibrosis or scarring in the lung bases. Suggestion of patchy airspace infiltrates in the left lung base possibly due to pneumonia. Motion artifact limits evaluation. Hepatobiliary: No focal liver abnormality is seen. No gallstones, gallbladder wall thickening, or biliary dilatation. Pancreas: Unremarkable. No pancreatic ductal dilatation or surrounding inflammatory changes. Spleen: Surgically absent Adrenals/Urinary Tract: Limited visualization of the kidneys. No obvious abnormalities identified. Bladder wall is not thickened. No bladder filling defects. Residual contrast material in the bladder. Stomach/Bowel: Stomach, small bowel, and colon are not abnormally distended. Limited visualization particularly of the colon due to streak artifact. No obvious inflammatory  changes. Appendix is not identified. Vascular/Lymphatic: Limited visualization of the abdominal aorta due to streak artifact but no aneurysm is suggested. Reproductive: Uterus is surgically absent. No obvious pelvic mass although visualization of the pelvis is limited due to streak artifact. Other: No free air or free fluid in the visualized abdomen. Musculoskeletal: No acute or significant osseous findings. IMPRESSION: 1. Residual contrast material throughout the colon limits examination. 2. No acute process demonstrated in the abdomen or pelvis. No evidence of bowel obstruction or inflammation. 3. Suggestion of patchy airspace infiltrates in the left lung base possibly indicating pneumonia. Electronically Signed   By: Lucienne Capers M.D.   On: 09/19/2018 22:17   Dg Chest Port 1 View  Result Date: 09/19/2018 CLINICAL DATA:  58 year old female found down with confusion. EXAM: PORTABLE CHEST 1 VIEW COMPARISON:  Chest radiographs 08/13/2018 and earlier. FINDINGS: Portable AP semi upright view at 1915 hours. Lung volumes and mediastinal contours are within normal limits. Subtotal clearing of left basilar opacity since 08/13/2018. No areas of worsening ventilation. No pneumothorax or pulmonary edema. Hyperdense material in the left upper quadrant may be retained barium at the splenic flexure, uncertain. Extensive prior cervical spine surgery. No acute osseous abnormality identified. IMPRESSION: Regressed but not fully resolved mild left lung base opacity since February. No new cardiopulmonary abnormality. Electronically Signed   By: Lemmie Evens  [place] Crane M.D.   On: 09/19/2018 19:33     Medications:  Prior to Admission:  Medications Prior to Admission  Medication Sig Dispense Refill Last Dose   ADVAIR DISKUS 250-50 MCG/DOSE AEPB Inhale 1 puff into the lungs 2 times daily. 3 each 3 Taking   albuterol (PROVENTIL HFA;VENTOLIN HFA) 108 (90 Base) MCG/ACT inhaler Inhale 2 puffs into the lungs every 6 (six) hours as needed  for wheezing or shortness of breath. 8.5 g 2 Taking   AMBULATORY NON FORMULARY MEDICATION Rolling Walker use as needed.  Disp 1 G35 1 each 0 Taking   carisoprodol (SOMA) 350 MG tablet Take 350 mg by mouth 3 (three) times daily.   Taking   DULoxetine (CYMBALTA) 30 MG capsule Take 30 mg by mouth 2 (two) times daily.  11 Taking   EASY TOUCH PEN NEEDLES 32G X 6 MM MISC USE AS DIRECTED 100 each prn Taking   ferrous sulfate 325 (65 FE) MG tablet Take 1 tablet (325 mg total) by mouth daily. 30 tablet 0 Taking   gabapentin (NEURONTIN) 300 MG capsule Take 1 capsule (300 mg total) by mouth 4 (four) times daily. 120 capsule 3 Taking   Insulin Detemir (LEVEMIR FLEXTOUCH) 100 UNIT/ML Pen Inject 20 Units into the skin 2 (two) times daily.   Taking   insulin lispro (HUMALOG KWIKPEN) 100 UNIT/ML KwikPen Inject 0.15 mLs (15 Units total) into the skin 3 (three) times daily. 15 mL 3 Taking   Lacosamide (VIMPAT) 100 MG TABS Take 100 mg by mouth 2 (two) times daily.    Taking   losartan (COZAAR) 25 MG tablet Take 1 tablet (25 mg total) by mouth daily. 90 tablet 1    omeprazole (PRILOSEC) 40 MG capsule Take 1 capsule (40 mg total) by mouth daily. 90 capsule 3 Taking   ondansetron (ZOFRAN-ODT) 8 MG disintegrating tablet Take 1 tablet (8 mg total) by mouth every 8 (eight) hours as needed for nausea or vomiting.   Taking   ONE TOUCH ULTRA TEST test strip USE TO CHECK BLOOD SUGAR THREE TIMES A DAY 500 each 11 Taking   Oxycodone HCl 20 MG TABS Take 20 mg by mouth every 4 (four) hours as needed (pain).   0 Taking   PHARMACIST CHOICE LANCETS MISC USE TO CHECK BLOOD SUGAR THREE TIMES A DAY 200 each 11 Taking   potassium chloride (K-DUR,KLOR-CON) 10 MEQ tablet Take 1 tablet (10 mEq total) by mouth once for 1 dose. 30 tablet 3    QUEtiapine (SEROQUEL) 200 MG tablet Take 1 tablet (200 mg total) by mouth at bedtime. 90 tablet 1 Taking   rosuvastatin (CRESTOR) 20 MG tablet Take 1 tablet (20 mg total) by mouth  daily. 90 tablet 3    SPIRIVA HANDIHALER 18 MCG inhalation capsule Place 1 capsule into inhaler and inhale daily. 90 capsule 3 Taking   sucralfate (CARAFATE) 1 g tablet Take 1 tablet (1 g total) by mouth 4 (four) times daily -  with meals and at bedtime. 120 tablet 0 Taking   triamcinolone cream (KENALOG) 0.1 % Apply 1 application topically 2 (two) times daily. 453.6 g 12 Taking   venlafaxine XR (EFFEXOR-XR) 75 MG 24 hr capsule Take 1 capsule (75 mg total) by mouth daily with breakfast. 90 capsule 1    Scheduled:  budesonide (PULMICORT) nebulizer solution  0.25 mg Nebulization BID   insulin aspart  0-9 Units Subcutaneous Q4H   ipratropium-albuterol  3 mL Nebulization Q6H   Continuous:  ciprofloxacin 400 mg (09/21/18  0045)   lacosamide (VIMPAT) IV 100 mg (09/20/18 2153)   metronidazole 500 mg (09/21/18 1950)   EEG from yesterday: This is an abnormal electroencephalogram secondary to general background slowing and bihemispheric periodic discharges of triphasic morphology (left greater than right). These are seen most commonly in encephalopathic states, although can not rule out other possibilities such as seizure.  Clinical correlation recommended.       ASSESSMENT: 58 year old female with PMH of seizures on Vimpat, chronic pain, "HIV" (with negative HIV testing this admission), "MS" (MRI does not appear consistent with this)  ITP, SLE anxiety and polypharmacy brought in for evaluation of AMS. 1. Appearance of the strikingly symmetric DWI lesions on MRI appears most consistent with a toxic/metabolic process. 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 Supporting this, she states that she did not eat prior to taking her insulin at home based on her most recent memory preceding the  blackout.  3. Patient feels that she is back to baseline. In addition to possible hypoglycemic episode, the patient also thinks that she may have had a seizure at home. However, she has been compliant with her Vimpat.   3. EEG from yesterday revealed general background slowing and bihemispheric periodic discharges of triphasic morphology (left greater than right), which can be seen most commonly in encephalopathic states. 4. Clinically, she is no longer encephalopathic       Recommendations: -Continue Vimpat at her home dose -Continue/restart home Topamax   -Diabetes education. Further investigate whether her home insulin dosing regimen is optimal or suboptimal. Does she have a glucometer at home? -Outpatient Neurology follow up for her epilepsy as well as for repeat MRI in 3 months -Neurohospitalist service will sign off. Please call if there are additional questions.    LOS: 1 day   @Electronically  signed: Dr. Kerney Elbe 09/21/2018  7:15 AM

## 2018-09-21 NOTE — Progress Notes (Signed)
PROGRESS NOTE    Amber Faulkner  ENI:[id number] DOB: [birth year] DOA: 09/19/2018 PCP: Hali Marry, MD   Brief Narrative:  The patient is a 58 year old female brought from home, found on the floor.  She is unable to give any history.  She was in the hospital in the past, chart review shows that she has history of seizure, hypertension, type 2 diabetes, questionable history of HIV, so far negative HIV test on our records from 4 years ago.On arrival to the emergency room, she was with significant aphasia.  Previous records also indicate that she is on chronic pain medications and anxiety medications.  Patient was just repeating sentences.  There was no focal deficit.  She was found with mild LFT elevation.  CT scan of the abdomen did not show any significant findings.  Chest x-ray with suspected pneumonia.  MRI showed bilateral nonspecific changes  **Interim History Patient underwent EEG Testing and this AM woke up and was alert and oriented. Neurology evaluated and signed off case  Assessment & Plan:   Principal Problem:   Acute encephalopathy Active Problems:   Essential hypertension   Uncontrolled type 2 diabetes mellitus with diabetic polyneuropathy, with long-term current use of insulin (HCC)   Abdominal pain   Elevated LFTs   Encephalopathy acute  Acute Metabolic Encephalopathy, improved  -Cause unknown.   -Acute infection versus polypharmacy or possibility of seizure and postictal state. Continue monitoring.   -Neurochecks every 4 hours.   -Patient is now awake enough, will allow her to eat under supervision. EEG as below.   -Changed to IV seizure medications by neurology for better absorption and switched back to po. -Suspected polypharmacy, discontinuing all sedating medications. -Treating as presumptive pneumonia and is on Cipro/Flagyl and will repeat CXR in AM. -TSH was 2.67 -Check RPR, B12 (616), B1 Levels  -Check UDS -MRI showed "Ill-defined white matter lesions  with reduced diffusion are present within the left-greater-than-right inferior frontal gyri as well as the splenium of corpus callosum. The pattern is atypical for ischemic infarction. There is a broad differential, for example transit metabolic derangement, demyelination, seizure, drug toxicity (antiepileptic, metronidazole, sympathomimetic.), ETOH encephalopathy, or hypoglycemia.Ther was also Background of nonspecific white matter hyperintensities may represent chronic sequelae of the acute process described above or age advanced chronic microvascular ischemic changes. -Patient is now A and O x4  -Neurology signed off   Suspected Pneumonia COPD -CXR showed "Regressed but not fully resolved mild left lung base opacity since February. No new cardiopulmonary abnormality." -Left lower lobe pneumonia.  Presented with leukocytosis and cough.   -She is a smoker with COPD.  Will treat as pneumonia.   -There was concern about intra-abdominal infection, she is on ciprofloxacin and Flagyl.   -Will continue for next 24 hours until patient's clinical improvement or further localization of infection. -Repeat CXR in AM -C/w Budesonide 0.25 mg Neb BID and DuoNeb q6h and q2hprn Wheezing  -Currently her Advair Diskus, Spirivia, and Albuterol Inhalers are bing held  Abnormal LFTs -Cause unknown.   -CT scan of the abdomen was fairly normal.   -She had no evidence of biliary obstruction.  No cholecystitis.  -On repeat chemistry panel, bilirubin is normal.   -Patient have might have transient transaminase elevation from shock or hypotension. -LFTs are now trending down and AST went from 407 -> 184 and ALT went from 593 -> 462 -T Bili was 1.1 -Hepatitis panel showed HCV Ab >11.0 so will check Genotype and RNA Quant -Continue to [facility]  Statin  -Continue to Monitor and Trend LFTs  Leukocytosis -Worsening. WBC went from 13.8 -> 14.5 -> 15.6 -Unclear Etiology -Currently on Abx with Cipro/Flagyl and will  empricially continue today -Continue to Monitor for S/Sx of Infection -Urinalysis showed Large Hgb, 80 Ketones and >50 RBC/HPF and 6-10 WBC and Negative Leukocytes -Urine Cx showed No Growth -MRSA PCR was Negative -Continue to Monitor and Repeat CBC in AM  Thrombocytopenia -Patient's Platelet Count dropped from 150 -> 106 -Continue to Monitor for S/Sx of Bleeding -Repeat CBC in AM   Diabetes Mellitus Type 2 -CBG's ranging from 106-173 -Change Sensitive Novolog SSI q4h to AC/HS -Resume Home Insulin Detemir but at a reduced dose of 10 units BID -Check HbA1c in AM   Essential Hypertension -Blood pressures are fairly normal and her Losartan 25 mg po Daily is being  Polypharmacy -Holding all reported medications that are causing sedation including Carisoprodol, Gabapentin, Duloxetine, Oxycodone, Quetiapine and Venlafaxine XR  Seizure Disorder -EEG done and Impression was "This is an abnormal electroencephalogram secondary to general background slowing and bihemispheric periodic discharges of triphasic morphology (left greater than right). These are seen most commonly in encephalopathic states, although can not rule out other possibilities such as seizure" -Neurology consulted and recommending continuing Vimpat at her Home dose as well as continuing and restarting Topiromate -Currently was on 100 mg q12h of Lacosomide and will change to po  -Upon further MAR review there is no Topiromate listed on it -Neurology stating outpatient Follow up for her Epilepsy as well as MRI in 3 months   History of HIV -There are many places in the chart that says patient has history of HIV.   -HIV test in 2016 was negative.   -Will repeat before proceeding to check CD4 counts and repeat this Admission was Non-Reactive   Hypokalemia -Patient's K+ was 3.2 -Replete with po KCl 40 mEQ BID x2 -Continue to Monitor and Replete as Necessary -Repeat CMP in AM  Hypophosphatemia -Patient's Phos Level this  AM was 2.4 -Replete with po K Phos Neutral 500 mg BID x 2 doses -Continue to Monitor and Replete as Necessary -Repeat Phos Level in AM   Microcytic Anemia  -Patient's Hb/Hct went from 8.8/30.9 -> 9.2/31.3 -Checked Anemia Panel and showed iron level of 13, U IBC of 4 4, TIBC of 417, saturation ratio of 3%, ferritin level of 28, folate level 18.1, and vitamin B12 level of 616 -Resume Home Ferrous Sulfate 325 mg po Daily  -Continue to Monitor for S/Sx of Bleeding -Repeat CBC in AM   ADD/Anxiety/Chornic Pain/?Neuromuscular Disorder -Currently holding Soma, Duloxetine, Gabapentin, Oxycodone, and Quetiapine as well as Venlafaxaine  DVT prophylaxis: SCDs Code Status: FULL CODE Family Communication: No family present at bedside  Disposition Plan: Anticipate D/C home in the next 24-48 hours if medically stable  Consultants:   Neurology    Procedures:  EEG Description:  The background activity is slow and poorly organized.  It consists of low voltage, polymorphic delta activity that is continuous and diffusely distributed.  There are frequent periodic discharges of triphasic morphology that are seen bihemispherically, but more common over the left hemisphere.    Hyperventilation and intermittent photic stimulation were not performed.   IMPRESSION: This is an abnormal electroencephalogram secondary to general background slowing and bihemispheric periodic discharges of triphasic morphology (left greater than right). These are seen most commonly in encephalopathic states, although can not rule out other possibilities such as seizure.  Clinical correlation recommended.  Antimicrobials:  Anti-infectives (From admission, onward)   Start     Dose/Rate Route Frequency Ordered Stop   09/20/18 0600  metroNIDAZOLE (FLAGYL) IVPB 500 mg     500 mg 100 mL/hr over 60 Minutes Intravenous Every 8 hours 09/20/18 0116     09/20/18 0130  ciprofloxacin (CIPRO) IVPB 400 mg     400 mg 200 mL/hr  over 60 Minutes Intravenous Every 12 hours 09/20/18 0121     09/19/18 2245  doxycycline (VIBRAMYCIN) 100 mg in sodium chloride 0.9 % 250 mL IVPB     100 mg 125 mL/hr over 120 Minutes Intravenous  Once 09/19/18 2239 09/20/18 0300   09/19/18 2115  ciprofloxacin (CIPRO) IVPB 400 mg  Status:  Discontinued     400 mg 200 mL/hr over 60 Minutes Intravenous  Once 09/19/18 2107 09/19/18 2239   09/19/18 2115  metroNIDAZOLE (FLAGYL) IVPB 500 mg  Status:  Discontinued     500 mg 100 mL/hr over 60 Minutes Intravenous  Once 09/19/18 2107 09/19/18 2239     Subjective: And examined at bedside and she was awake and alert and oriented x4.  She states that she was having some soreness in her legs but denied any nausea or vomiting.  No chest pain, lightheadedness or dizziness.  No other concerns or complaints at this time.  Objective: Vitals:   09/21/18 0740 09/21/18 0744 09/21/18 0748 09/21/18 1147  BP: 123/66 123/66  116/64  Pulse: 79 83  84  Resp: 20 19  (!) 22  Temp: 97.9 F (36.6 C)   97.6 F (36.4 C)  TempSrc: Oral   Oral  SpO2: 94% 94% 94% 94%  Weight:      Height:        Intake/Output Summary (Last 24 hours) at 09/21/2018 1623 Last data filed at 09/21/2018 1200 Gross per 24 hour  Intake 535 ml  Output --  Net 535 ml   Filed Weights   09/19/18 1844 09/20/18 0345  Weight: 80.7 kg 79.5 kg   Examination: Physical Exam:  Constitutional: WN/WD overweight NAD and appears calm and comfortable Eyes: Lids and conjunctivae normal, sclerae anicteric  ENMT: External Ears, Nose appear normal. Grossly normal hearing.  Neck: Appears normal, supple, no cervical masses, normal ROM, no appreciable thyromegaly; no JVD Respiratory: Diminished to auscultation bilaterally, no wheezing, rales, rhonchi or crackles. Normal respiratory effort and patient is not tachypenic. No accessory muscle use.  Cardiovascular: RRR, no murmurs / rubs / gallops. S1 and S2 auscultated. No extremity edema.  Abdomen: Soft,  non-tender, Distended slightly to body habitus. No masses palpated. No appreciable hepatosplenomegaly. Bowel sounds positive.  GU: Deferred. Musculoskeletal: No clubbing / cyanosis of digits/nails. No joint deformity upper and lower extremities. Skin: No rashes, lesions, ulcers on a limited skin evaluation. No induration; Warm and dry.  Neurologic: CN 2-12 grossly intact with no focal deficits.  Romberg sign and cerebellar reflexes not assessed.  Psychiatric: Normal judgment and insight. Alert and oriented x 3. Normal mood and appropriate affect.   Data Reviewed: I have personally reviewed following labs and imaging studies  CBC: Recent Labs  Lab 09/19/18 1949 09/19/18 2007 09/20/18 0501 09/21/18 0325  WBC 13.8*  --  14.5* 15.6*  NEUTROABS 9.5*  --  9.3* 10.5*  HGB 9.1* 10.9* 8.8* 9.2*  HCT 32.7* 32.0* 30.9* 31.3*  MCV 76.4*  --  75.9* 74.5*  PLT 150  --  PLATELET CLUMPS NOTED ON SMEAR, UNABLE TO ESTIMATE 101*   Basic Metabolic Panel: Recent Labs  Lab 09/19/18 1949 09/19/18 2007 09/20/18 0501 09/21/18 0325 09/21/18 0908  NA 139 138 139 138  --   K 3.7 3.9 3.8 3.2*  --   CL 103  --  101 105  --   CO2 25  --  24 21*  --   GLUCOSE 109*  --  105* 136*  --   BUN 20  --  17 13  --   CREATININE 0.67  --  0.75 0.64  --   CALCIUM 8.3*  --  8.5* 8.4*  --   MG  --   --   --   --  1.7  PHOS  --   --   --   --  2.4*   GFR: Estimated Creatinine Clearance: 82.8 mL/min (by C-G formula based on SCr of 0.64 mg/dL). Liver Function Tests: Recent Labs  Lab 09/19/18 1949 09/20/18 0501 09/21/18 0325  AST 407* 323* 184*  ALT 593* 564* 462*  ALKPHOS 120 118 112  BILITOT 1.0 1.2 1.1  PROT 6.1* 6.3* 6.0*  ALBUMIN 3.3* 3.2* 3.1*   Recent Labs  Lab 09/19/18 1949  LIPASE 17   Recent Labs  Lab 09/19/18 1949  AMMONIA 14   Coagulation Profile: No results for input(s): INR, PROTIME in the last 168 hours. Cardiac Enzymes: No results for input(s): CKTOTAL, CKMB, CKMBINDEX, TROPONINI  in the last 168 hours. BNP (last 3 results) No results for input(s): PROBNP in the last 8760 hours. HbA1C: No results for input(s): HGBA1C in the last 72 hours. CBG: Recent Labs  Lab 09/20/18 1924 09/20/18 2331 09/21/18 0405 09/21/18 0741 09/21/18 1144  GLUCAP 106* 112* 138* 173* 134*   Lipid Profile: No results for input(s): CHOL, HDL, LDLCALC, TRIG, CHOLHDL, LDLDIRECT in the last 72 hours. Thyroid Function Tests: Recent Labs    09/21/18 0908  TSH 2.667   Anemia Panel: Recent Labs    09/21/18 0908  VITAMINB12 616  FOLATE 18.1  FERRITIN 28  TIBC 417  IRON 13*  RETICCTPCT 0.6   Sepsis Labs: Recent Labs  Lab 09/19/18 1949 09/19/18 2311  LATICACIDVEN 1.0 0.9    Recent Results (from the past 240 hour(s))  Urine culture     Status: None   Collection Time: 09/19/18 10:25 PM  Result Value Ref Range Status   Specimen Description URINE, RANDOM  Final   Special Requests NONE  Final   Culture   Final    NO GROWTH Performed at [facility], [place] [address], [place], [place] [address]    Report Status 09/21/2018 FINAL  Final  MRSA PCR Screening     Status: None   Collection Time: 09/20/18  2:10 AM  Result Value Ref Range Status   MRSA by PCR NEGATIVE NEGATIVE Final    Comment:        The GeneXpert MRSA Assay (FDA approved for NASAL specimens only), is one component of a comprehensive MRSA colonization surveillance program. It is not intended to diagnose MRSA infection nor to guide or monitor treatment for MRSA infections. Performed at [facility], [place] [address] [place], [place] [address]     Estimated body mass index is 29.17 kg/m as calculated from the following:   Height as of this encounter: 5\' 5"  (1.651 m).   Weight as of this encounter: 79.5 kg.   Radiology Studies: Ct Head Wo Contrast  Result Date: 09/19/2018 CLINICAL DATA:  58 year old female with increased confusion over the past week, found down today. EXAM: CT  HEAD WITHOUT  CONTRAST TECHNIQUE: Contiguous axial images were obtained from the base of the skull through the vertex without intravenous contrast. COMPARISON:  PET-CT 01/06/2015. FINDINGS: Brain: Normal cerebral volume. No midline shift, ventriculomegaly, mass effect, evidence of mass lesion, intracranial hemorrhage or evidence of cortically based acute infarction. Possible small chronic infarct in the right cerebellar hemisphere on series 3, image 7. Mild scattered cerebral white matter hypodensity. Vascular: Calcified atherosclerosis at the skull base. No suspicious intracranial vascular hyperdensity. Skull: No acute osseous abnormality identified. Sinuses/Orbits: Visualized paranasal sinuses and mastoids are well pneumatized. Minor mucosal thickening in the left maxillary sinus. Other: Negative orbits. Visualized scalp soft tissues are within normal limits. IMPRESSION: 1. No acute intracranial abnormality. 2. Possible small chronic right cerebellar infarct. Mild nonspecific white matter changes most commonly due to small vessel disease. Electronically Signed   By: Genevie Ann M.D.   On: 09/19/2018 19:48   Mr Jeri Cos And Wo Contrast  Result Date: 09/20/2018 CLINICAL DATA:  58 y/o F; increased confusion over the last week. Found on floor today. EXAM: MRI HEAD WITHOUT AND WITH CONTRAST TECHNIQUE: Multiplanar, multiecho pulse sequences of the brain and surrounding structures were obtained without and with intravenous contrast. CONTRAST:  7.5 cc Gadavist COMPARISON:  09/19/2018 CT head. FINDINGS: Brain: Ill-defined foci of reduced diffusion are present within the right greater than left centrum semiovale a posterior aspect of inferior frontal gyrus as well as the splenium of corpus callosum (series 7, image 49). No associated hemorrhage or mass effect. There are several foci of T2 FLAIR hyperintense signal abnormality within subcortical and periventricular white matter, left greater than right posterior limb of internal capsule,,  body of corpus callosum, and the right cerebellar hemisphere. There are a few punctate foci of susceptibility hypointensity scattered in a nonspecific distribution throughout the bilateral frontal and parietal lobes compatible with hemosiderin deposition of chronic microhemorrhage. No extra-axial collection, hydrocephalus, mass effect, or herniation. After administration of intravenous contrast there is no abnormal enhancement. Vascular: Normal flow voids. Skull and upper cervical spine: Normal marrow signal. Sinuses/Orbits: Mild paranasal sinus mucosal thickening greatest in the left maxillary sinus. No abnormal signal of mastoid air cells. Orbits are unremarkable. Other: None. IMPRESSION: 1. Ill-defined white matter lesions with reduced diffusion are present within the left-greater-than-right inferior frontal gyri as well as the splenium of corpus callosum. The pattern is atypical for ischemic infarction. There is a broad differential, for example transit metabolic derangement, demyelination, seizure, drug toxicity (antiepileptic, metronidazole, sympathomimetic.), ETOH encephalopathy, or hypoglycemia. 2. Background of nonspecific white matter hyperintensities may represent chronic sequelae of the acute process described above or age advanced chronic microvascular ischemic changes. Electronically Signed   By: Kristine Garbe M.D.   On: 09/20/2018 00:34   Ct Abdomen Pelvis W Contrast  Result Date: 09/19/2018 CLINICAL DATA:  Right upper quadrant pain. EXAM: CT ABDOMEN AND PELVIS WITH CONTRAST TECHNIQUE: Multidetector CT imaging of the abdomen and pelvis was performed using the standard protocol following bolus administration of intravenous contrast. CONTRAST:  13mL OMNIPAQUE IOHEXOL 300 MG/ML  SOLN COMPARISON:  Head CT 01/06/2015 FINDINGS: There is residual contrast material demonstrated throughout the colon. Streak artifact arising from this dense contrast material limits the examination in some areas.  Lower chest: Emphysematous changes and linear fibrosis or scarring in the lung bases. Suggestion of patchy airspace infiltrates in the left lung base possibly due to pneumonia. Motion artifact limits evaluation. Hepatobiliary: No focal liver abnormality is seen. No gallstones, gallbladder wall thickening, or biliary dilatation. Pancreas:  Unremarkable. No pancreatic ductal dilatation or surrounding inflammatory changes. Spleen: Surgically absent Adrenals/Urinary Tract: Limited visualization of the kidneys. No obvious abnormalities identified. Bladder wall is not thickened. No bladder filling defects. Residual contrast material in the bladder. Stomach/Bowel: Stomach, small bowel, and colon are not abnormally distended. Limited visualization particularly of the colon due to streak artifact. No obvious inflammatory changes. Appendix is not identified. Vascular/Lymphatic: Limited visualization of the abdominal aorta due to streak artifact but no aneurysm is suggested. Reproductive: Uterus is surgically absent. No obvious pelvic mass although visualization of the pelvis is limited due to streak artifact. Other: No free air or free fluid in the visualized abdomen. Musculoskeletal: No acute or significant osseous findings. IMPRESSION: 1. Residual contrast material throughout the colon limits examination. 2. No acute process demonstrated in the abdomen or pelvis. No evidence of bowel obstruction or inflammation. 3. Suggestion of patchy airspace infiltrates in the left lung base possibly indicating pneumonia. Electronically Signed   By: Lucienne Capers M.D.   On: 09/19/2018 22:17   Dg Chest Port 1 View  Result Date: 09/19/2018 CLINICAL DATA:  58 year old female found down with confusion. EXAM: PORTABLE CHEST 1 VIEW COMPARISON:  Chest radiographs 08/13/2018 and earlier. FINDINGS: Portable AP semi upright view at 1915 hours. Lung volumes and mediastinal contours are within normal limits. Subtotal clearing of left basilar  opacity since 08/13/2018. No areas of worsening ventilation. No pneumothorax or pulmonary edema. Hyperdense material in the left upper quadrant may be retained barium at the splenic flexure, uncertain. Extensive prior cervical spine surgery. No acute osseous abnormality identified. IMPRESSION: Regressed but not fully resolved mild left lung base opacity since February. No new cardiopulmonary abnormality. Electronically Signed   By: Genevie Ann M.D.   On: 09/19/2018 19:33   Scheduled Meds:  budesonide (PULMICORT) nebulizer solution  0.25 mg Nebulization BID   insulin aspart  0-9 Units Subcutaneous Q4H   ipratropium-albuterol  3 mL Nebulization Q6H   potassium chloride  40 mEq Oral BID   Continuous Infusions:  ciprofloxacin 400 mg (09/21/18 0917)   lacosamide (VIMPAT) IV 100 mg (09/21/18 0914)   metronidazole 500 mg (09/21/18 1525)    LOS: 1 day   Kerney Elbe, DO [facility] PAGER is on [place]  If 7PM-7AM, please contact night-coverage [email or web address] Password [facility] 09/21/2018, 4:23 PM

## 2018-09-22 ENCOUNTER — Inpatient Hospital Stay (HOSPITAL_COMMUNITY): Payer: Medicare Other

## 2018-09-22 DIAGNOSIS — D72829 Elevated white blood cell count, unspecified: Secondary | ICD-10-CM

## 2018-09-22 DIAGNOSIS — E1142 Type 2 diabetes mellitus with diabetic polyneuropathy: Secondary | ICD-10-CM

## 2018-09-22 DIAGNOSIS — Z794 Long term (current) use of insulin: Secondary | ICD-10-CM

## 2018-09-22 DIAGNOSIS — E1165 Type 2 diabetes mellitus with hyperglycemia: Secondary | ICD-10-CM

## 2018-09-22 DIAGNOSIS — D693 Immune thrombocytopenic purpura: Secondary | ICD-10-CM

## 2018-09-22 DIAGNOSIS — R109 Unspecified abdominal pain: Secondary | ICD-10-CM

## 2018-09-22 DIAGNOSIS — D696 Thrombocytopenia, unspecified: Secondary | ICD-10-CM

## 2018-09-22 LAB — COMPREHENSIVE METABOLIC PANEL
ALT: 290 U/L — ABNORMAL HIGH (ref 0–44)
AST: 66 U/L — ABNORMAL HIGH (ref 15–41)
Albumin: 3.1 g/dL — ABNORMAL LOW (ref 3.5–5.0)
Alkaline Phosphatase: 91 U/L (ref 38–126)
Anion gap: 8 (ref 5–15)
BUN: 17 mg/dL (ref 6–20)
CO2: 22 mmol/L (ref 22–32)
Calcium: 8.3 mg/dL — ABNORMAL LOW (ref 8.9–10.3)
Chloride: 107 mmol/L (ref 98–111)
Creatinine, Ser: 0.54 mg/dL (ref 0.44–1.00)
GFR calc Af Amer: >60 mL/min (ref >60)
GFR calc non Af Amer: >60 mL/min (ref >60)
Glucose, Bld: 102 mg/dL — ABNORMAL HIGH (ref 70–99)
Potassium: 3.6 mmol/L (ref 3.5–5.1)
Sodium: 137 mmol/L (ref 135–145)
TOTAL PROTEIN: 5.7 g/dL — AB (ref 6.5–8.1)
Total Bilirubin: 0.6 mg/dL (ref 0.3–1.2)

## 2018-09-22 LAB — CBC WITH DIFFERENTIAL/PLATELET
ABS IMMATURE GRANULOCYTES: 0.1 10*3/uL — AB (ref 0.00–0.07)
Basophils Absolute: 0.1 10*3/uL (ref 0.0–0.1)
Basophils Relative: 1 %
Eosinophils Absolute: 0.3 10*3/uL (ref 0.0–0.5)
Eosinophils Relative: 2 %
HEMATOCRIT: 29 % — AB (ref 36.0–46.0)
Hemoglobin: 8.6 g/dL — ABNORMAL LOW (ref 12.0–15.0)
Immature Granulocytes: 1 %
Lymphocytes Relative: 21 %
Lymphs Abs: 3.1 10*3/uL (ref 0.7–4.0)
MCH: 21.9 pg — ABNORMAL LOW (ref 26.0–34.0)
MCHC: 29.7 g/dL — ABNORMAL LOW (ref 30.0–36.0)
MCV: 73.8 fL — ABNORMAL LOW (ref 80.0–100.0)
Monocytes Absolute: 2.8 10*3/uL — ABNORMAL HIGH (ref 0.1–1.0)
Monocytes Relative: 20 %
Neutro Abs: 8.1 10*3/uL — ABNORMAL HIGH (ref 1.7–7.7)
Neutrophils Relative %: 55 %
Platelets: 78 10*3/uL — ABNORMAL LOW (ref 150–400)
RBC: 3.93 MIL/uL (ref 3.87–5.11)
RDW: 25.9 % — AB (ref 11.5–15.5)
WBC: 14.4 10*3/uL — ABNORMAL HIGH (ref 4.0–10.5)
nRBC: 12.4 % — ABNORMAL HIGH (ref 0.0–0.2)

## 2018-09-22 LAB — HCV RNA QUANT: HCV Quantitative: NOT DETECTED IU/mL (ref >50)

## 2018-09-22 LAB — HEMOGLOBIN A1C
Hgb A1c MFr Bld: 6.2 % — ABNORMAL HIGH (ref 4.8–5.6)
MEAN PLASMA GLUCOSE: 131.24 mg/dL

## 2018-09-22 LAB — RETICULOCYTES
Immature Retic Fract: 60.6 % — ABNORMAL HIGH (ref 2.3–15.9)
RBC.: 4.11 MIL/uL (ref 3.87–5.11)
Retic Count, Absolute: 47.2 10*3/uL (ref 19.0–186.0)
Retic Ct Pct: 1.1 % (ref 0.4–3.1)

## 2018-09-22 LAB — RAPID URINE DRUG SCREEN, HOSP PERFORMED
Amphetamines: NOT DETECTED
Barbiturates: NOT DETECTED
Benzodiazepines: NOT DETECTED
Cocaine: NOT DETECTED
Opiates: NOT DETECTED
Tetrahydrocannabinol: NOT DETECTED

## 2018-09-22 LAB — HCV RNA QUANT RFLX ULTRA OR GENOTYP
HCV RNA Qnt(log copy/mL): UNDETERMINED log10 IU/mL
HepC Qn: NOT DETECTED IU/mL

## 2018-09-22 LAB — GLUCOSE, CAPILLARY
GLUCOSE-CAPILLARY: 116 mg/dL — AB (ref 70–99)
GLUCOSE-CAPILLARY: 242 mg/dL — AB (ref 70–99)
Glucose-Capillary: 91 mg/dL (ref 70–99)
Glucose-Capillary: 109 mg/dL — ABNORMAL HIGH (ref 70–99)
Glucose-Capillary: 124 mg/dL — ABNORMAL HIGH (ref 70–99)
Glucose-Capillary: 109 mg/dL — ABNORMAL HIGH (ref 70–99)

## 2018-09-22 LAB — RPR: RPR Ser Ql: NONREACTIVE

## 2018-09-22 LAB — SAVE SMEAR(SSMR), FOR PROVIDER SLIDE REVIEW

## 2018-09-22 LAB — MAGNESIUM: Magnesium: 1.6 mg/dL — ABNORMAL LOW (ref 1.7–2.4)

## 2018-09-22 LAB — PHOSPHORUS: Phosphorus: 3.3 mg/dL (ref 2.5–4.6)

## 2018-09-22 LAB — LACTATE DEHYDROGENASE: LDH: 199 U/L — ABNORMAL HIGH (ref 98–192)

## 2018-09-22 MED ORDER — MAGNESIUM SULFATE 2 GM/50ML IV SOLN
2.0000 g | Freq: Once | INTRAVENOUS | Status: AC
  Administered 2018-09-22: 2 g via INTRAVENOUS
  Filled 2018-09-22: qty 50

## 2018-09-22 MED ORDER — IPRATROPIUM-ALBUTEROL 0.5-2.5 (3) MG/3ML IN SOLN
3.0000 mL | Freq: Two times a day (BID) | RESPIRATORY_TRACT | Status: DC
  Administered 2018-09-22 – 2018-09-25 (×6): 3 mL via RESPIRATORY_TRACT
  Filled 2018-09-22 (×6): qty 3

## 2018-09-22 NOTE — Progress Notes (Signed)
PROGRESS NOTE    Amber Faulkner  CBJ:[id number] DOB: 28-Dec-1960 DOA: 09/19/2018 PCP: Hali Marry, MD   Brief Narrative:  The patient is a 58 year old female brought from home, found on the floor.  She is unable to give any history.  She was in the hospital in the past, chart review shows that she has history of seizure, hypertension, type 2 diabetes, questionable history of HIV, so far negative HIV test on our records from 4 years ago.On arrival to the emergency room, she was with significant aphasia.  Previous records also indicate that she is on chronic pain medications and anxiety medications.  Patient was just repeating sentences.  There was no focal deficit.  She was found with mild LFT elevation.  CT scan of the abdomen did not show any significant findings.  Chest x-ray with suspected pneumonia.  MRI showed bilateral nonspecific changes  **Interim History Patient underwent EEG Testing and this AM woke up and was alert and oriented. Neurology evaluated and signed off case. Platelet Count started dropping and she has a Hx of ITP so case was discussed with Hematology and recommended repeating Platelet Count on Citrate in AM and watching for bleeding.   Assessment & Plan:   Principal Problem:   Acute encephalopathy Active Problems:   Essential hypertension   Uncontrolled type 2 diabetes mellitus with diabetic polyneuropathy, with long-term current use of insulin (HCC)   Abdominal pain   Elevated LFTs   Encephalopathy acute  Acute Metabolic Encephalopathy, improved  -Cause unknown.   -Acute infection versus polypharmacy or possibility of seizure and postictal state. Continue monitoring.   -Neurochecks every 4 hours.   -Patient is now awake enough, will allow her to eat under supervision. -EEG as below.   -Changed to IV seizure medications by neurology for better absorption and switched back to po. -Suspected polypharmacy, discontinuing all sedating medications. -Treating as  presumptive pneumonia and is was Cipro/Flagyl which I stopped -Repeat CXR in AM showed Unchanged appearance of the chest with streaky LEFT basilar opacities again noted. -TSH was 2.67 -Check RPR, B12 (616), B1 Levels  -Checked UDS and was Negative  -MRI showed "Ill-defined white matter lesions with reduced diffusion are present within the left-greater-than-right inferior frontal gyri as well as the splenium of corpus callosum. The pattern is atypical for ischemic infarction. There is a broad differential, for example transit metabolic derangement, demyelination, seizure, drug toxicity (antiepileptic, metronidazole, sympathomimetic.), ETOH encephalopathy, or hypoglycemia.Ther was also Background of nonspecific white matter hyperintensities may represent chronic sequelae of the acute process described above or age advanced chronic microvascular ischemic changes. -Patient is now A and O x4  -Neurology signed off   Left Lower Pneumonia COPD -Admission CXR showed "Regressed but not fully resolved mild left lung base opacity since February. No new cardiopulmonary abnormality." -Repeat CXR showed "Unchanged appearance of the chest with streaky LEFT basilar opacities again noted." -Presented with leukocytosis and cough.   -She is a smoker with COPD.  Was treated as Pneumonia but Abx have now stoppedd .   -There was concern about intra-abdominal infection, she was on Ciprofloxacin and Flagyl which is now D/C'd -Repeat CXR in AM -C/w Budesonide 0.25 mg Neb BID and DuoNeb q6h and q2hprn Wheezing  -Currently her Advair Diskus, Spirivia, and Albuterol Inhalers are bing held but will resume at D/C   Abnormal LFTs, improving -Cause unknown.   -CT scan of the abdomen was fairly normal.   -She had no evidence of biliary obstruction.  No cholecystitis.  -  On repeat chemistry panel, bilirubin is normal.   -Patient have might have transient transaminase elevation from shock or hypotension. -LFTs are now  trending down and AST went from 407 -> 184 -> 66 and ALT went from 593 -> 462 -> 290 -T Bili was 1.1 and improved to 290 -Hepatitis panel showed HCV Ab >11.0 so will check Genotype and RNA Quant; HCV Quantitative Not Detected and Genotype was "RTNI" -Continue to Hold Home Statin for now -Continue to Monitor and Trend LFTs  Leukocytosis -Worsening. WBC went from 13.8 -> 14.5 -> 15.6 -> 14.4 -Unclear Etiology -Abx with Cipro/Flagyl now D/C'd  -Continue to Monitor for S/Sx of Infection -Urinalysis showed Large Hgb, 80 Ketones and >50 RBC/HPF and 6-10 WBC and Negative Leukocytes -Urine Cx showed No Growth -MRSA PCR was Negative -Continue to Monitor and Repeat CBC in AM  Thrombocytopenia in the Setting of Prior Chronic ITP -Patient's Platelet Count dropped from 150 -> 106 -> 78 -Patient has had a Splenectomy and two IVIG's previously -Obtained LDH (199) and Reticulocyte Count and Smear -Discussed the case with Oncology Dr. Walden Field who recommended Stopping Enoxaparin (Not on her [facility]) and other medications that can induce Thrombocytopenia and repeating Platelet Count in Citrate in AM -She recommends to continue to Monitor for S/Sx of Bleeding -Has had Treatment of Steroids (Dexamethasone) with IVIG x2 along with Rituximab  -Patient does have bruising on Right Hip and Legs -If platelet Counts Drop further will need Hematology formal Input -Repeat CBC in AM   Diabetes Mellitus Type 2 -CBG's ranging from 106-173 -Change Sensitive Novolog SSI q4h to AC/HS -Resume Home Insulin Detemir but at a reduced dose of 10 units BID -Checked HbA1c and was 6.2  Essential Hypertension -Blood pressures are fairly normal and her Losartan 25 mg po Daily is being  Polypharmacy -Holding all reported medications that are causing sedation including Carisoprodol, Gabapentin, Duloxetine, Oxycodone, Quetiapine and Venlafaxine XR  Seizure Disorder -EEG done and Impression was "This is an abnormal  electroencephalogram secondary to general background slowing and bihemispheric periodic discharges of triphasic morphology (left greater than right). These are seen most commonly in encephalopathic states, although can not rule out other possibilities such as seizure" -Neurology consulted and recommending continuing Vimpat at her Home dose as well as continuing and restarting Topiromate -Currently was on 100 mg q12h of Lacosomide and will change to po  -Upon further MAR review there is no Topiromate listed on it -Neurology stating outpatient Follow up for her Epilepsy as well as MRI in 3 months   History of HIV -There are many places in the chart that says patient has history of HIV.   -HIV test in 2016 was negative.   -Will repeat before proceeding to check CD4 counts and repeat this Admission was Non-Reactive   Hypokalemia -Patient's K+ was 3.2 and repeat this AM was 3.6 -Replete with po KCl 40 mEQ BID x2 yesterday -Continue to Monitor and Replete as Necessary -Repeat CMP in AM  Hypophosphatemia -Patient's Phos Level this AM was 2.4 and improved to 3.3 -Replete with po K Phos Neutral 500 mg BID x 2 doses -Continue to Monitor and Replete as Necessary -Repeat Phos Level in AM   Hypomagnesemia  -Patient's Mag Level was 1.6 -Replete with IV Mag Sulfate 2 Grams -Continue to Monitor and Replete as Necessary  -Repeat Mag Level in AM   Microcytic Anemia  -Patient's Hb/Hct went from 8.8/30.9 -> 9.2/31.3 ->8.6/29.0 -Checked Anemia Panel and showed iron level of 13, U IBC  of 4 4, TIBC of 417, saturation ratio of 3%, ferritin level of 28, folate level 18.1, and vitamin B12 level of 616 -Resume Home Ferrous Sulfate 325 mg po Daily  -Continue to Monitor for S/Sx of Bleeding -Repeat CBC in AM   ADD/Anxiety/Chornic Pain/?Neuromuscular Disorder -Currently holding Soma, Duloxetine, Gabapentin, Oxycodone, and Quetiapine as well as Venlafaxine and will add back slowly   MS -Diagnosed in 2016  -MRI done and It did not show any enhancement but showed multiple areas of restricted diffusion, not characteristic of strokes in b/l cerebral hemispheres and also in corpus callosum with a broad differential of etiology -Neurology has already evaluated  DVT prophylaxis: SCDs Code Status: FULL CODE Family Communication: No family present at bedside  Disposition Plan: Anticipate D/C home in the next 24-48 hours if medically stable  Consultants:   Neurology   Discussed Case with Hematology   Procedures:  EEG Description:  The background activity is slow and poorly organized.  It consists of low voltage, polymorphic delta activity that is continuous and diffusely distributed.  There are frequent periodic discharges of triphasic morphology that are seen bihemispherically, but more common over the left hemisphere.    Hyperventilation and intermittent photic stimulation were not performed.   IMPRESSION: This is an abnormal electroencephalogram secondary to general background slowing and bihemispheric periodic discharges of triphasic morphology (left greater than right). These are seen most commonly in encephalopathic states, although can not rule out other possibilities such as seizure.  Clinical correlation recommended.         Antimicrobials:  Anti-infectives (From admission, onward)   Start     Dose/Rate Route Frequency Ordered Stop   09/20/18 0600  metroNIDAZOLE (FLAGYL) IVPB 500 mg  Status:  Discontinued     500 mg 100 mL/hr over 60 Minutes Intravenous Every 8 hours 09/20/18 0116 09/22/18 0935   09/20/18 0130  ciprofloxacin (CIPRO) IVPB 400 mg  Status:  Discontinued     400 mg 200 mL/hr over 60 Minutes Intravenous Every 12 hours 09/20/18 0121 09/22/18 0935   09/19/18 2245  doxycycline (VIBRAMYCIN) 100 mg in sodium chloride 0.9 % 250 mL IVPB     100 mg 125 mL/hr over 120 Minutes Intravenous  Once 09/19/18 2239 09/20/18 0300   09/19/18 2115  ciprofloxacin (CIPRO) IVPB 400 mg   Status:  Discontinued     400 mg 200 mL/hr over 60 Minutes Intravenous  Once 09/19/18 2107 09/19/18 2239   09/19/18 2115  metroNIDAZOLE (FLAGYL) IVPB 500 mg  Status:  Discontinued     500 mg 100 mL/hr over 60 Minutes Intravenous  Once 09/19/18 2107 09/19/18 2239     Subjective: Seen and examined at bedside and that she was feeling fine.  Did state that her legs were hurting a little bit.  No nausea or vomiting.  Denied any chest pain, lightheadedness or dizziness.  She has a history of thrombocytopenia and has had her spleen removed.  No other concerns or complaints at this time but was worried about her platelet count dropping again.   Objective: Vitals:   09/22/18 0602 09/22/18 0806 09/22/18 0818 09/22/18 1701  BP:  122/71  110/60  Pulse:  70 81 79  Resp:   19   Temp:  97.8 F (36.6 C)  98.2 F (36.8 C)  TempSrc:  Oral  Oral  SpO2:  96% 95% 98%  Weight: 78.2 kg     Height:        Intake/Output Summary (Last 24 hours) at 09/22/2018  1722 Last data filed at 09/22/2018 0630 Gross per 24 hour  Intake 602.64 ml  Output 1400 ml  Net -797.36 ml   Filed Weights   09/19/18 1844 09/20/18 0345 09/22/18 0602  Weight: 80.7 kg 79.5 kg 78.2 kg   Examination: Physical Exam:  Constitutional:_Well-developed overweight Caucasian female currently no acute distress appears calm and comfortable Eyes: Lids and conjunctive are normal.  Sclera anicteric ENMT: External ears and nose appear normal.  Grossly normal hearing Neck: No JVD noted Respiratory: Diminished auscultation bilaterally no appreciable wheezing, rales, rhonchi.  Patient not tachypneic wheezing and accessory muscles to breathe Cardiovascular: Regular rate and rhythm.  No appreciable murmurs, rubs, gallops.  No lower extremity edema noted Abdomen: Soft, nontender, distended slightly secondary body habitus.  Bowel sounds present  GU: Deferred Musculoskeletal: No contractures or cyanosis.  No joint deformities noted Skin: Is warm  and dry.  Does have some lower extremity bruising and a bruise on the right hip which she states is from a fall Neurologic: Cranial nerves II through XII grossly intact no appreciable focal deficits Psychiatric: Normal Judgment and insight.  Patient is awake, alert and oriented x3.  Pleasant mood and affect  Data Reviewed: I have personally reviewed following labs and imaging studies  CBC: Recent Labs  Lab 09/19/18 1949 09/19/18 2007 09/20/18 0501 09/21/18 0325 09/22/18 0259  WBC 13.8*  --  14.5* 15.6* 14.4*  NEUTROABS 9.5*  --  9.3* 10.5* 8.1*  HGB 9.1* 10.9* 8.8* 9.2* 8.6*  HCT 32.7* 32.0* 30.9* 31.3* 29.0*  MCV 76.4*  --  75.9* 74.5* 73.8*  PLT 150  --  PLATELET CLUMPS NOTED ON SMEAR, UNABLE TO ESTIMATE 106* 78*   Basic Metabolic Panel: Recent Labs  Lab 09/19/18 1949 09/19/18 2007 09/20/18 0501 09/21/18 0325 09/21/18 0908 09/22/18 0259  NA 139 138 139 138  --  137  K 3.7 3.9 3.8 3.2*  --  3.6  CL 103  --  101 105  --  107  CO2 25  --  24 21*  --  22  GLUCOSE 109*  --  105* 136*  --  102*  BUN 20  --  17 13  --  17  CREATININE 0.67  --  0.75 0.64  --  0.54  CALCIUM 8.3*  --  8.5* 8.4*  --  8.3*  MG  --   --   --   --  1.7 1.6*  PHOS  --   --   --   --  2.4* 3.3   GFR: Estimated Creatinine Clearance: 82.2 mL/min (by C-G formula based on SCr of 0.54 mg/dL). Liver Function Tests: Recent Labs  Lab 09/19/18 1949 09/20/18 0501 09/21/18 0325 09/22/18 0259  AST 407* 323* 184* 66*  ALT 593* 564* 462* 290*  ALKPHOS 120 118 112 91  BILITOT 1.0 1.2 1.1 0.6  PROT 6.1* 6.3* 6.0* 5.7*  ALBUMIN 3.3* 3.2* 3.1* 3.1*   Recent Labs  Lab 09/19/18 1949  LIPASE 17   Recent Labs  Lab 09/19/18 1949  AMMONIA 14   Coagulation Profile: No results for input(s): INR, PROTIME in the last 168 hours. Cardiac Enzymes: No results for input(s): CKTOTAL, CKMB, CKMBINDEX, TROPONINI in the last 168 hours. BNP (last 3 results) No results for input(s): PROBNP in the last 8760 hours.  HbA1C: Recent Labs    09/22/18 0259  HGBA1C 6.2*   CBG: Recent Labs  Lab 09/21/18 2304 09/22/18 0443 09/22/18 0802 09/22/18 1155 09/22/18 1659  GLUCAP 216* 91  109* 116* 242*   Lipid Profile: No results for input(s): CHOL, HDL, LDLCALC, TRIG, CHOLHDL, LDLDIRECT in the last 72 hours. Thyroid Function Tests: Recent Labs    09/21/18 0908  TSH 2.667   Anemia Panel: Recent Labs    09/21/18 0908 09/22/18 1021  VITAMINB12 616  --   FOLATE 18.1  --   FERRITIN 28  --   TIBC 417  --   IRON 13*  --   RETICCTPCT 0.6 1.1   Sepsis Labs: Recent Labs  Lab 09/19/18 1949 09/19/18 2311  LATICACIDVEN 1.0 0.9    Recent Results (from the past 240 hour(s))  Urine culture     Status: None   Collection Time: 09/19/18 10:25 PM  Result Value Ref Range Status   Specimen Description URINE, RANDOM  Final   Special Requests NONE  Final   Culture   Final    NO GROWTH Performed at [facility], [place] [address], [place], [place] [address]    Report Status 09/21/2018 FINAL  Final  MRSA PCR Screening     Status: None   Collection Time: 09/20/18  2:10 AM  Result Value Ref Range Status   MRSA by PCR NEGATIVE NEGATIVE Final    Comment:        The GeneXpert MRSA Assay (FDA approved for NASAL specimens only), is one component of a comprehensive MRSA colonization surveillance program. It is not intended to diagnose MRSA infection nor to guide or monitor treatment for MRSA infections. Performed at [facility], [place] [address] [place], [place] [address]     Estimated body mass index is 28.71 kg/m as calculated from the following:   Height as of this encounter: 5\' 5"  (1.651 m).   Weight as of this encounter: 78.2 kg.   Radiology Studies: Dg Chest Port 1 View  Result Date: 09/22/2018 CLINICAL DATA:  Cough and shortness of breath. EXAM: PORTABLE CHEST 1 VIEW COMPARISON:  09/19/2010 FINDINGS: The cardiomediastinal silhouette is unremarkable. Streaky LEFT basilar opacities  are unchanged. There is no evidence of pulmonary edema, suspicious pulmonary nodule/mass, pleural effusion, or pneumothorax. No acute bony abnormalities are identified. Cervical spine fusion changes again noted. IMPRESSION: Unchanged appearance of the chest with streaky LEFT basilar opacities again noted. Electronically Signed   By: Margarette Canada M.D.   On: 09/22/2018 07:41   Scheduled Meds: . budesonide (PULMICORT) nebulizer solution  0.25 mg Nebulization BID  . cholecalciferol  1,000 Units Oral Daily  . ferrous sulfate  325 mg Oral Daily  . insulin aspart  0-5 Units Subcutaneous QHS  . insulin aspart  0-9 Units Subcutaneous TID WC  . insulin detemir  10 Units Subcutaneous BID  . ipratropium-albuterol  3 mL Nebulization BID  . lacosamide  100 mg Oral BID  . pantoprazole  40 mg Oral Daily  . vitamin B-12  1,000 mcg Oral Daily   Continuous Infusions:   LOS: 2 days   Kerney Elbe, DO [facility] PAGER is on AMION  If 7PM-7AM, please contact night-coverage [email or web address] Password [facility] 09/22/2018, 5:22 PM

## 2018-09-23 ENCOUNTER — Inpatient Hospital Stay (HOSPITAL_COMMUNITY): Payer: Medicare Other

## 2018-09-23 DIAGNOSIS — J181 Lobar pneumonia, unspecified organism: Secondary | ICD-10-CM

## 2018-09-23 DIAGNOSIS — D509 Iron deficiency anemia, unspecified: Secondary | ICD-10-CM

## 2018-09-23 DIAGNOSIS — R7989 Other specified abnormal findings of blood chemistry: Secondary | ICD-10-CM

## 2018-09-23 LAB — CBC WITH DIFFERENTIAL/PLATELET
Abs Immature Granulocytes: 0.11 10*3/uL — ABNORMAL HIGH (ref 0.00–0.07)
BASOS ABS: 0.2 10*3/uL — AB (ref 0.0–0.1)
Basophils Relative: 1 %
EOS ABS: 0.4 10*3/uL (ref 0.0–0.5)
Eosinophils Relative: 3 %
HCT: 28.7 % — ABNORMAL LOW (ref 36.0–46.0)
Hemoglobin: 8.1 g/dL — ABNORMAL LOW (ref 12.0–15.0)
Immature Granulocytes: 1 %
Lymphocytes Relative: 23 %
Lymphs Abs: 3.2 10*3/uL (ref 0.7–4.0)
MCH: 21.2 pg — ABNORMAL LOW (ref 26.0–34.0)
MCHC: 28.2 g/dL — ABNORMAL LOW (ref 30.0–36.0)
MCV: 75.1 fL — AB (ref 80.0–100.0)
Monocytes Absolute: 2.6 10*3/uL — ABNORMAL HIGH (ref 0.1–1.0)
Monocytes Relative: 19 %
NRBC: 10.7 % — AB (ref 0.0–0.2)
Neutro Abs: 7.7 10*3/uL (ref 1.7–7.7)
Neutrophils Relative %: 53 %
Platelets: 64 10*3/uL — ABNORMAL LOW (ref 150–400)
RBC: 3.82 MIL/uL — ABNORMAL LOW (ref 3.87–5.11)
RDW: 26 % — ABNORMAL HIGH (ref 11.5–15.5)
WBC: 14.2 10*3/uL — ABNORMAL HIGH (ref 4.0–10.5)

## 2018-09-23 LAB — COMPREHENSIVE METABOLIC PANEL
ALT: 195 U/L — ABNORMAL HIGH (ref 0–44)
AST: 32 U/L (ref 15–41)
Albumin: 3.2 g/dL — ABNORMAL LOW (ref 3.5–5.0)
Alkaline Phosphatase: 89 U/L (ref 38–126)
Anion gap: 9 (ref 5–15)
BILIRUBIN TOTAL: 0.5 mg/dL (ref 0.3–1.2)
BUN: 15 mg/dL (ref 6–20)
CO2: 21 mmol/L — ABNORMAL LOW (ref 22–32)
Calcium: 8.6 mg/dL — ABNORMAL LOW (ref 8.9–10.3)
Chloride: 108 mmol/L (ref 98–111)
Creatinine, Ser: 0.56 mg/dL (ref 0.44–1.00)
GFR calc Af Amer: >60 mL/min (ref >60)
Glucose, Bld: 150 mg/dL — ABNORMAL HIGH (ref 70–99)
Potassium: 3.7 mmol/L (ref 3.5–5.1)
Sodium: 138 mmol/L (ref 135–145)
TOTAL PROTEIN: 5.8 g/dL — AB (ref 6.5–8.1)

## 2018-09-23 LAB — GLUCOSE, CAPILLARY
GLUCOSE-CAPILLARY: 175 mg/dL — AB (ref 70–99)
GLUCOSE-CAPILLARY: 164 mg/dL — AB (ref 70–99)
Glucose-Capillary: 104 mg/dL — ABNORMAL HIGH (ref 70–99)
Glucose-Capillary: 286 mg/dL — ABNORMAL HIGH (ref 70–99)

## 2018-09-23 LAB — MAGNESIUM: Magnesium: 1.9 mg/dL (ref 1.7–2.4)

## 2018-09-23 LAB — PHOSPHORUS: Phosphorus: 3.8 mg/dL (ref 2.5–4.6)

## 2018-09-23 LAB — PLATELET BY CITRATE: Platelet CT in Citrate: UNDETERMINED

## 2018-09-23 MED ORDER — OXYCODONE HCL 5 MG PO TABS
10.0000 mg | ORAL_TABLET | Freq: Four times a day (QID) | ORAL | Status: DC | PRN
  Administered 2018-09-23 – 2018-09-26 (×9): 10 mg via ORAL
  Filled 2018-09-23 (×9): qty 2

## 2018-09-23 MED ORDER — PREDNISONE 20 MG PO TABS
60.0000 mg | ORAL_TABLET | Freq: Every day | ORAL | Status: DC
  Administered 2018-09-23 – 2018-09-26 (×4): 60 mg via ORAL
  Filled 2018-09-23 (×5): qty 3

## 2018-09-23 MED ORDER — CALCIUM CARBONATE 1250 (500 CA) MG PO TABS
1.0000 | ORAL_TABLET | Freq: Two times a day (BID) | ORAL | Status: DC
  Administered 2018-09-23 – 2018-09-26 (×6): 500 mg via ORAL
  Filled 2018-09-23 (×6): qty 1

## 2018-09-23 MED ORDER — CALCIUM CARBONATE 1500 (600 CA) MG PO TABS
1500.0000 mg | ORAL_TABLET | Freq: Two times a day (BID) | ORAL | Status: DC
  Filled 2018-09-23: qty 1

## 2018-09-23 NOTE — Consult Note (Signed)
Referring physician:  Dr. Barb Merino Diagnosis Community acquired pneumonia of right lower lobe of lung ([place])  Transient alteration of awareness  SOB (shortness of breath) - Plan: DG CHEST PORT 1 VIEW, DG CHEST PORT 1 VIEW  Foot pain - Plan: DG Foot 2 Views Right, DG Foot 2 Views Right  Thrombocytopenia ([place]) - Plan: predniSONE (DELTASONE) tablet 60 mg  Staging Cancer Staging No matching staging information was found for the patient.   HPI:  58 year old female brought from home, found on the floor. She is unable to give any history. She was in the hospital in the past, chart review shows that she has history of seizure, hypertension, type 2 diabetes, questionable history of HIV, so far negative HIV test on our records from 4 years ago.On arrival to the emergency room, she was with significant aphasia. Previous records also indicate that she is on chronic pain medications and anxiety medications.   Patient has history of ITP followed in [place].  Records indicated she has been seen at [facility] by Dr. Rudean Hitt.  Pt reports she was taking Ibuprofen.    Pt was admitted on 09/20/2018 and labs showed WBC 15.6 HB 9.2 plts 106,000.  Chemistries WNL with K+ 3.2 Cr 0.64 and elevated LFTs.  LDH 199.  Smear shows no significant fragmentation.    Pt is alert today.  She reports minor bruising.    Pt is seen today for consultation due to thrombocytopenia.   Past Medical History Past Medical History:  Diagnosis Date  . ADD (attention deficit disorder)   . Anxiety   . Chronic pain   . Diabetes mellitus   . HIV infection ([place])   . Hyperlipidemia   . Neuromuscular disorder ([place])   . Pneumonia 09/2018    Past Surgical History Past Surgical History:  Procedure Laterality Date  . ABDOMINAL HYSTERECTOMY  1991   complete  . CARPAL TUNNEL RELEASE  1993-1994   both wrist  . CERVICAL DISCECTOMY  03/25/2017   Dr. Cyndia Diver, [place]  . CESAREAN SECTION  1980  . HERNIA REPAIR  1976   . SPLENECTOMY  09/2016  . TARSAL TUNNEL RELEASE  1993   left ankle  . TUMOR REMOVAL  1993   left thigh    Family History Family History  Problem Relation Age of Onset  . Cancer Sister        hepatic and pancreatic  . Cancer Mother        brain and lung  . COPD Mother   . Hyperlipidemia Father   . Hypertension Father   . Diabetes Father   . Heart disease Father   . Cancer Maternal Aunt        breast     Social History  reports that she has been smoking cigarettes. She has been smoking about 1.50 packs per day. She has never used smokeless tobacco. She reports that she does not drink alcohol or use drugs.  Medications Prior to Admission medications   Medication Sig Start Date End Date Taking? Authorizing Provider  ADVAIR DISKUS 250-50 MCG/DOSE AEPB Inhale 1 puff into the lungs 2 times daily. 05/29/18  Yes Hali Marry, MD  albuterol (PROVENTIL HFA;VENTOLIN HFA) 108 (90 Base) MCG/ACT inhaler Inhale 2 puffs into the lungs every 6 (six) hours as needed for wheezing or shortness of breath. Patient taking differently: Inhale 2 puffs into the lungs every 6 (six) hours as needed for wheezing or shortness of breath.  08/27/18  Yes Hali Marry,  MD  calcium carbonate (CALCIUM 600) 600 MG TABS tablet Take 600 mg by mouth 2 (two) times daily with a meal.   Yes [provider]  carisoprodol (SOMA) 350 MG tablet Take 350 mg by mouth 3 (three) times daily.   Yes Lawrence Marseilles, MD  cholecalciferol (VITAMIN D3) 25 MCG (1000 UT) tablet Take 1,000 Units by mouth daily.   Yes [provider]  DULoxetine (CYMBALTA) 30 MG capsule Take 30 mg by mouth 2 (two) times daily. 04/26/18  Yes [provider]  ferrous sulfate 325 (65 FE) MG tablet Take 1 tablet (325 mg total) by mouth daily. 08/15/18 08/15/19 Yes Black, Lezlie Octave, NP  gabapentin (NEURONTIN) 300 MG capsule Take 1 capsule (300 mg total) by mouth 4 (four) times daily. 06/20/18  Yes Hali Marry, MD   Insulin Detemir (LEVEMIR FLEXTOUCH) 100 UNIT/ML Pen Inject 20 Units into the skin 2 (two) times daily. 08/15/18  Yes Black, Lezlie Octave, NP  insulin lispro (HUMALOG KWIKPEN) 100 UNIT/ML KwikPen Inject 0.15 mLs (15 Units total) into the skin 3 (three) times daily. 05/29/18  Yes Hali Marry, MD  Lacosamide (VIMPAT) 100 MG TABS Take 100 mg by mouth 2 (two) times daily.  12/25/15  Yes [provider]  levofloxacin (LEVAQUIN) 750 MG tablet Take 750 mg by mouth daily. Started on 09-04-18  DS 10 09/04/18  Yes [provider]  omeprazole (PRILOSEC) 40 MG capsule Take 1 capsule (40 mg total) by mouth daily. 03/13/18  Yes Hali Marry, MD  ondansetron (ZOFRAN-ODT) 8 MG disintegrating tablet Take 1 tablet (8 mg total) by mouth every 8 (eight) hours as needed for nausea or vomiting. 08/15/18  Yes Black, Lezlie Octave, NP  Oxycodone HCl 20 MG TABS Take 20 mg by mouth every 6 (six) hours as needed (pain). And at bedtime as needed   Yes [provider]  potassium chloride (K-DUR,KLOR-CON) 10 MEQ tablet Take 1 tablet (10 mEq total) by mouth once for 1 dose. Patient taking differently: Take 10 mEq by mouth daily.  09/04/18 09/21/18 Yes Hali Marry, MD  QUEtiapine (SEROQUEL) 200 MG tablet Take 1 tablet (200 mg total) by mouth at bedtime. 05/29/18  Yes Hali Marry, MD  rosuvastatin (CRESTOR) 20 MG tablet Take 1 tablet (20 mg total) by mouth daily. Patient taking differently: Take 20 mg by mouth daily. Take 1 tablet (20 mg total) by mouth daily. 09/06/18  Yes Hali Marry, MD  SPIRIVA HANDIHALER 18 MCG inhalation capsule Place 1 capsule into inhaler and inhale daily. 05/29/18  Yes Hali Marry, MD  triamcinolone cream (KENALOG) 0.1 % Apply 1 application topically 2 (two) times daily. 08/20/18  Yes Gregor Hams, MD  venlafaxine XR (EFFEXOR-XR) 75 MG 24 hr capsule Take 1 capsule (75 mg total) by mouth daily with breakfast. Patient taking differently: Take 75  mg by mouth daily. Take 1 capsule (75 mg total) by mouth daily with breakfast. 09/06/18  Yes Hali Marry, MD  vitamin B-12 (CYANOCOBALAMIN) 1000 MCG tablet Take 1,000 mcg by mouth daily.   Yes [provider]  AMBULATORY NON FORMULARY MEDICATION Rolling Walker use as needed.  Disp 1 G35 08/13/18   Gregor Hams, MD  EASY TOUCH PEN NEEDLES 32G X 6 MM MISC USE AS DIRECTED 09/26/14   Hali Marry, MD  losartan (COZAAR) 25 MG tablet Take 1 tablet (25 mg total) by mouth daily. Patient not taking: Reported on 09/21/2018 09/06/18   Hali Marry,  MD  ONE TOUCH ULTRA TEST test strip USE TO CHECK BLOOD SUGAR THREE TIMES A DAY 04/23/18   Hali Marry, MD  PHARMACIST CHOICE LANCETS MISC USE TO CHECK BLOOD SUGAR THREE TIMES A DAY 04/23/18   Hali Marry, MD  sucralfate (CARAFATE) 1 g tablet Take 1 tablet (1 g total) by mouth 4 (four) times daily -  with meals and at bedtime. Patient not taking: Reported on 09/21/2018 05/29/18   Hali Marry, MD    Allergies Cephalosporins; Vancomycin; Penicillins; Ampicillin; Lisinopril; and Penicillin g  Review of Systems Review of Systems - Oncology ROS negative   Physical Exam  Vitals Wt Readings from Last 3 Encounters:  09/23/18 172 lb 6.4 oz (78.2 kg)  09/04/18 178 lb (80.7 kg)  08/20/18 190 lb (86.2 kg)   Temp Readings from Last 3 Encounters:  09/23/18 97.8 F (36.6 C) (Oral)  09/04/18 98.2 F (36.8 C)  08/15/18 100 F (37.8 C) (Oral)   BP Readings from Last 3 Encounters:  09/23/18 130/81  09/04/18 130/75  08/20/18 (!) 135/106   Pulse Readings from Last 3 Encounters:  09/23/18 75  09/04/18 86  08/20/18 96   Constitutional: Well-developed, well-nourished, and in no distress.   HENT: Head: Normocephalic and atraumatic.  Mouth/Throat: No oropharyngeal exudate. Mucosa moist. Eyes: Pupils are equal, round, and reactive to light. Conjunctivae are normal. No scleral icterus.  Neck: Normal  range of motion. Neck supple. No JVD present.  Cardiovascular: Normal rate, regular rhythm and normal heart sounds.  Exam reveals no gallop and no friction rub.   No murmur heard. Pulmonary/Chest: Effort normal and breath sounds normal. No respiratory distress. No wheezes.No rales.  Abdominal: Soft. Bowel sounds are normal. No distension. There is no tenderness. There is no guarding.  Musculoskeletal: No edema or tenderness.  Lymphadenopathy: No cervical, axillary or supraclavicular adenopathy.  Neurological: Alert and oriented to person, place, and time. No cranial nerve deficit.  Skin: Skin is warm and dry. No rash noted. No erythema. No pallor. Minor bruises noted on LE. Psychiatric: Affect and judgment normal.   Labs No results displayed because visit has over 200 results.       Pathology Orders Placed This Encounter  Procedures  . Urine culture    Standing Status:   Standing    Number of Occurrences:   1  . MRSA PCR Screening    Standing Status:   Standing    Number of Occurrences:   1  . CT HEAD WO CONTRAST    Standing Status:   Standing    Number of Occurrences:   1    Order Specific Question:   Radiology Contrast Protocol - do NOT remove file path    Answer:   \\[email or web address]\epicdata\Radiant\CTProtocols.pdf  . DG Chest Port 1 View    Standing Status:   Standing    Number of Occurrences:   1    Order Specific Question:   Reason for Exam (SYMPTOM  OR DIAGNOSIS REQUIRED)    Answer:   ams  . CT ABDOMEN PELVIS W CONTRAST    Standing Status:   Standing    Number of Occurrences:   1    Order Specific Question:   Does the patient have a contrast media/X-ray dye allergy?    Answer:   No    Order Specific Question:   If indicated for the ordered procedure, I authorize the administration of contrast media per Radiology protocol    Answer:   Yes  Order Specific Question:   Is Oral Contrast requested for this exam?    Answer:   No oral contrast    Order Specific Question:    Reason for No Oral Contrast    Answer:   Medical necessity (Time Sensitive)    Order Specific Question:   Radiology Contrast Protocol - do NOT remove file path    Answer:   \\charchive\epicdata\Radiant\CTProtocols.pdf  . MR Brain W and Wo Contrast    Standing Status:   Standing    Number of Occurrences:   1    Order Specific Question:   If indicated for the ordered procedure, I authorize the administration of contrast media per Radiology protocol    Answer:   Yes    Order Specific Question:   What is the patient's sedation requirement?    Answer:   No Sedation    Order Specific Question:   Does the patient have a pacemaker or implanted devices?    Answer:   No    Order Specific Question:   Radiology Contrast Protocol - do NOT remove file path    Answer:   \\charchive\epicdata\Radiant\mriPROTOCOL.PDF  . DG CHEST PORT 1 VIEW    Standing Status:   Standing    Number of Occurrences:   1    Order Specific Question:   Symptom/Reason for Exam    Answer:   SOB (shortness of breath) [376283]    Order Specific Question:   Radiology Contrast Protocol - do NOT remove file path    Answer:   \\charchive\epicdata\Radiant\DXFluoroContrastProtocols.pdf  . DG Foot 2 Views Right    Standing Status:   Standing    Number of Occurrences:   1    Order Specific Question:   Symptom/Reason for Exam    Answer:   Foot pain [[id number]]    Order Specific Question:   Radiology Contrast Protocol - do NOT remove file path    Answer:   \\charchive\epicdata\Radiant\DXFluoroContrastProtocols.pdf  . Ammonia    Standing Status:   Standing    Number of Occurrences:   1  . Comprehensive metabolic panel    Standing Status:   Standing    Number of Occurrences:   1  . Ethanol    Standing Status:   Standing    Number of Occurrences:   1  . CBC WITH DIFFERENTIAL    Standing Status:   Standing    Number of Occurrences:   1  . Urinalysis, Routine w reflex microscopic (not at [facility])    Standing Status:   Standing    Number  of Occurrences:   1  . Lipase, blood    Standing Status:   Standing    Number of Occurrences:   1  . Lactic acid, plasma    Standing Status:   Standing    Number of Occurrences:   2  . Pathologist smear review    Standing Status:   Standing    Number of Occurrences:   1  . Hepatitis panel, acute    Standing Status:   Standing    Number of Occurrences:   1  . Vitamin B1    Standing Status:   Standing    Number of Occurrences:   1  . Basic metabolic panel    Standing Status:   Standing    Number of Occurrences:   1  . Hepatic function panel    Standing Status:   Standing    Number of Occurrences:   1  . CBC  WITH DIFFERENTIAL    Standing Status:   Standing    Number of Occurrences:   1  . Glucose, capillary    Standing Status:   Standing    Number of Occurrences:   1  . HIV Antibody (routine testing w rflx)    Standing Status:   Standing    Number of Occurrences:   1  . Glucose, capillary    Standing Status:   Standing    Number of Occurrences:   1  . Comprehensive metabolic panel    Standing Status:   Standing    Number of Occurrences:   1  . CBC with Differential/Platelet    Standing Status:   Standing    Number of Occurrences:   1  . Glucose, capillary    Standing Status:   Standing    Number of Occurrences:   1  . Acetaminophen level    Standing Status:   Standing    Number of Occurrences:   1    Order Specific Question:   Specimen collection method    Answer:   Lab=Lab collect  . Hepatitis panel, acute    Standing Status:   Standing    Number of Occurrences:   1    Order Specific Question:   Specimen collection method    Answer:   Lab=Lab collect  . Glucose, capillary    Standing Status:   Standing    Number of Occurrences:   1  . Glucose, capillary    Standing Status:   Standing    Number of Occurrences:   1  . Glucose, capillary    Standing Status:   Standing    Number of Occurrences:   1  . Glucose, capillary    Standing Status:   Standing     Number of Occurrences:   1  . HCV RNA quant rflx ultra or genotyp    Standing Status:   Standing    Number of Occurrences:   1    Order Specific Question:   Specimen collection method    Answer:   Lab=Lab collect  . HCV RNA quant    Standing Status:   Standing    Number of Occurrences:   1    Order Specific Question:   Specimen collection method    Answer:   Lab=Lab collect  . Magnesium    Standing Status:   Standing    Number of Occurrences:   1    Order Specific Question:   Specimen collection method    Answer:   Lab=Lab collect  . Phosphorus    Standing Status:   Standing    Number of Occurrences:   1    Order Specific Question:   Specimen collection method    Answer:   Lab=Lab collect  . TSH    Standing Status:   Standing    Number of Occurrences:   1    Order Specific Question:   Specimen collection method    Answer:   Lab=Lab collect  . RPR    Standing Status:   Standing    Number of Occurrences:   1    Order Specific Question:   Specimen collection method    Answer:   Lab=Lab collect  . Vitamin B12    Standing Status:   Standing    Number of Occurrences:   1    Order Specific Question:   Specimen collection method    Answer:   Lab=Lab collect  .  Iron and TIBC    Standing Status:   Standing    Number of Occurrences:   1    Order Specific Question:   Specimen collection method    Answer:   Lab=Lab collect  . Ferritin    Standing Status:   Standing    Number of Occurrences:   1    Order Specific Question:   Specimen collection method    Answer:   Lab=Lab collect  . Reticulocytes    Standing Status:   Standing    Number of Occurrences:   1    Order Specific Question:   Specimen collection method    Answer:   Lab=Lab collect  . Vitamin B1    Standing Status:   Standing    Number of Occurrences:   1    Order Specific Question:   Specimen collection method    Answer:   Lab=Lab collect  . Folate    Standing Status:   Standing    Number of Occurrences:   1  .  Glucose, capillary    Standing Status:   Standing    Number of Occurrences:   1  . Glucose, capillary    Standing Status:   Standing    Number of Occurrences:   1  . CBC with Differential/Platelet    Standing Status:   Standing    Number of Occurrences:   1    Order Specific Question:   Specimen collection method    Answer:   Lab=Lab collect  . Comprehensive metabolic panel    Standing Status:   Standing    Number of Occurrences:   1    Order Specific Question:   Specimen collection method    Answer:   Lab=Lab collect  . Magnesium    Standing Status:   Standing    Number of Occurrences:   1    Order Specific Question:   Specimen collection method    Answer:   Lab=Lab collect  . Phosphorus    Standing Status:   Standing    Number of Occurrences:   1    Order Specific Question:   Specimen collection method    Answer:   Lab=Lab collect  . Hemoglobin A1c    Standing Status:   Standing    Number of Occurrences:   1    Order Specific Question:   Specimen collection method    Answer:   Lab=Lab collect  . Glucose, capillary    Standing Status:   Standing    Number of Occurrences:   1  . Glucose, capillary    Standing Status:   Standing    Number of Occurrences:   1  . Glucose, capillary    Standing Status:   Standing    Number of Occurrences:   1  . Glucose, capillary    Standing Status:   Standing    Number of Occurrences:   1  . Lactate dehydrogenase    Standing Status:   Standing    Number of Occurrences:   1    Order Specific Question:   Specimen collection method    Answer:   Lab=Lab collect  . Reticulocytes    Standing Status:   Standing    Number of Occurrences:   1    Order Specific Question:   Specimen collection method    Answer:   Lab=Lab collect  . Save Smear    Standing Status:   Standing    Number of  Occurrences:   1    Order Specific Question:   Specimen collection method    Answer:   Lab=Lab collect  . Glucose, capillary    Standing Status:    Standing    Number of Occurrences:   1  . Platelet by Citrate    Standing Status:   Standing    Number of Occurrences:   1    Order Specific Question:   Specimen collection method    Answer:   Lab=Lab collect  . Urine rapid drug screen (hosp performed)    Standing Status:   Standing    Number of Occurrences:   1  . Glucose, capillary    Standing Status:   Standing    Number of Occurrences:   1  . CBC with Differential/Platelet    Standing Status:   Standing    Number of Occurrences:   1    Order Specific Question:   Specimen collection method    Answer:   Lab=Lab collect  . Comprehensive metabolic panel    Standing Status:   Standing    Number of Occurrences:   1    Order Specific Question:   Specimen collection method    Answer:   Lab=Lab collect  . Magnesium    Standing Status:   Standing    Number of Occurrences:   1    Order Specific Question:   Specimen collection method    Answer:   Lab=Lab collect  . Phosphorus    Standing Status:   Standing    Number of Occurrences:   1    Order Specific Question:   Specimen collection method    Answer:   Lab=Lab collect  . Glucose, capillary    Standing Status:   Standing    Number of Occurrences:   1  . Glucose, capillary    Standing Status:   Standing    Number of Occurrences:   1  . Glucose, capillary    Standing Status:   Standing    Number of Occurrences:   1  . CBC with Differential/Platelet    Standing Status:   Standing    Number of Occurrences:   1    Order Specific Question:   Specimen collection method    Answer:   Lab=Lab collect  . Comprehensive metabolic panel    Standing Status:   Standing    Number of Occurrences:   1    Order Specific Question:   Specimen collection method    Answer:   Lab=Lab collect  . Magnesium    Standing Status:   Standing    Number of Occurrences:   1    Order Specific Question:   Specimen collection method    Answer:   Lab=Lab collect  . Phosphorus    Standing Status:    Standing    Number of Occurrences:   1    Order Specific Question:   Specimen collection method    Answer:   Lab=Lab collect  . Glucose, capillary    Standing Status:   Standing    Number of Occurrences:   1  . Diet regular Room service appropriate? Yes; Fluid consistency: Thin    Standing Status:   Standing    Number of Occurrences:   1    Order Specific Question:   Room service appropriate?    Answer:   Yes    Order Specific Question:   Fluid consistency:    Answer:   Thin  .  Cardiac monitoring    Standing Status:   Standing    Number of Occurrences:   1  . Initiate Carrier Fluid Protocol    Standing Status:   Standing    Number of Occurrences:   1  . STAT CBG when hypoglycemia is suspected. If treated, recheck every 15 minutes after each treatment until CBG >/= 70 mg/dl    Standing Status:   Standing    Number of Occurrences:   1  . Refer to Hypoglycemia Protocol Sidebar Report for treatment of CBG < 70 mg/dl    Standing Status:   Standing    Number of Occurrences:   1  . Vital signs    Standing Status:   Standing    Number of Occurrences:   1  . Notify physician    Standing Status:   Standing    Number of Occurrences:   20    Order Specific Question:   Notify Physician    Answer:   for pulse less than 55 or greater than 120    Order Specific Question:   Notify Physician    Answer:   for respiratory rate less than 12 or greater than 25    Order Specific Question:   Notify Physician    Answer:   for temperature greater than 100.5 F    Order Specific Question:   Notify Physician    Answer:   for urinary output less than 30 mL/hr for four hours    Order Specific Question:   Notify Physician    Answer:   for systolic BP less than 90 or greater than 453, diastolic BP less than 60 or greater than 100  . Initiate Oral Care Protocol    Standing Status:   Standing    Number of Occurrences:   1  . Initiate Carrier Fluid Protocol    Standing Status:   Standing    Number of  Occurrences:   1  . RN may order General Admission PRN Orders utilizing "General Admission PRN medications" (through manage orders) for the following patient needs: allergy symptoms (Claritin), cold sores (Carmex), cough (Robitussin DM), eye irritation (Liquifilm Tears), hemorrhoids (Tucks), indigestion (Maalox), minor skin irritation (Hydrocortisone Cream), muscle pain Suezanne Jacquet Gay), nose irritation (saline nasal spray) and sore throat (Chloraseptic spray).    Standing Status:   Standing    Number of Occurrences:   [id number]  . No HS correction Insulin    Standing Status:   Standing    Number of Occurrences:   1  . No meal coverage at this time    Standing Status:   Standing    Number of Occurrences:   1  . SCDs    Standing Status:   Standing    Number of Occurrences:   1    Order Specific Question:   Laterality    Answer:   Bilateral  . Up with assistance    Standing Status:   Standing    Number of Occurrences:   [id number]  . Daily weights    Standing Status:   Standing    Number of Occurrences:   1  . Intake and Output    Standing Status:   Standing    Number of Occurrences:   1  . STAT CBG when hypoglycemia is suspected. If treated, recheck every 15 minutes after each treatment until CBG >/= 70 mg/dl    Standing Status:   Standing    Number of Occurrences:   1  .  Refer to Hypoglycemia Protocol Sidebar Report for treatment of CBG < 70 mg/dl    Standing Status:   Standing    Number of Occurrences:   1  . Full code    Standing Status:   Standing    Number of Occurrences:   1  . Consult to Neuro Hospitalist    Standing Status:   Standing    Number of Occurrences:   1    Order Specific Question:   Place call to:    Answer:   Neuro Hospitalist on call    Order Specific Question:   Reason for Consult    Answer:   Admit  . Oxygen therapy Mode or (Route): Nasal cannula; Liters Per Minute: 2; Keep 02 saturation: greater than 92 %    Standing Status:   Standing    Number of Occurrences:   1     Order Specific Question:   Mode or (Route)    Answer:   Nasal cannula    Order Specific Question:   Liters Per Minute    Answer:   2    Order Specific Question:   Keep 02 saturation    Answer:   greater than 92 %  . CBG monitoring, ED    Standing Status:   Standing    Number of Occurrences:   1  . I-Stat venous blood gas, ED    Standing Status:   Standing    Number of Occurrences:   1  . I-Stat beta hCG blood, ED    Standing Status:   Standing    Number of Occurrences:   1  . POCT I-Stat EG7    Standing Status:   Standing    Number of Occurrences:   1  . EKG 12-Lead    Standing Status:   Standing    Number of Occurrences:   1  . EEG    Standing Status:   Standing    Number of Occurrences:   1  . Place in observation (patient's expected length of stay will be less than 2 midnights)    Standing Status:   Standing    Number of Occurrences:   1    Order Specific Question:   Hospital Area    Answer:   [place] [[id number]]    Order Specific Question:   Level of Care    Answer:   Progressive [102]    Order Specific Question:   I expect the patient will be discharged within 24 hours    Answer:   No (not a candidate for 5C-Observation unit)    Order Specific Question:   Diagnosis    Answer:   Acute encephalopathy [349179]    Order Specific Question:   Admitting Physician    Answer:   Rise Patience [phone]    Order Specific Question:   Attending Physician    Answer:   Rise Patience [phone]    Order Specific Question:   PT Class (Do Not Modify)    Answer:   Observation [104]    Order Specific Question:   PT Acc Code (Do Not Modify)    Answer:   Observation [10022]  . Place in observation (patient's expected length of stay will be less than 2 midnights)    Standing Status:   Standing    Number of Occurrences:   1    Order Specific Question:   Hospital Area    Answer:   [place] [  [id number]]    Order Specific Question:   Level of Care     Answer:   Progressive [102]    Order Specific Question:   I expect the patient will be discharged within 24 hours    Answer:   No (not a candidate for 5C-Observation unit)    Order Specific Question:   Diagnosis    Answer:   Acute encephalopathy [923300]    Order Specific Question:   Admitting Physician    Answer:   Rise Patience [phone]    Order Specific Question:   Attending Physician    Answer:   Rise Patience [phone]    Order Specific Question:   PT Class (Do Not Modify)    Answer:   Observation [104]    Order Specific Question:   PT Acc Code (Do Not Modify)    Answer:   Observation [10022]  . Admit to Inpatient (patient's expected length of stay will be greater than 2 midnights or inpatient only procedure)    Standing Status:   Standing    Number of Occurrences:   1    Order Specific Question:   Hospital Area    Answer:   [place] [[id number]]    Order Specific Question:   Level of Care    Answer:   Telemetry Medical [104]    Order Specific Question:   Diagnosis    Answer:   Encephalopathy acute [638937]    Order Specific Question:   Admitting Physician    Answer:   Tomma Rakers    Order Specific Question:   Attending Physician    Answer:   Tomma Rakers    Order Specific Question:   Estimated length of stay    Answer:   past midnight tomorrow    Order Specific Question:   Certification:    Answer:   I certify this patient will need inpatient services for at least 2 midnights    Order Specific Question:   PT Class (Do Not Modify)    Answer:   Inpatient [101]    Order Specific Question:   PT Acc Code (Do Not Modify)    Answer:   Private [1]      Assessment and Plan:  1.  Immune mediated thrombocytopenia. Pt was followed by Roxana Hires at [facility].   Pt has history of ITP treated in the past with steroids, IVIG and splenectomy.  She was admitted 09/20/2018 due to AMS.  Plt count on admission was 106,000.  Plt count has  trended downward and is now 64,000. Cr 0.56.  LDH 199.  No significant fragmentation noted on smear.  She has elevated ALT of 195.  Pt had CT abdomen and pelvis done 09/19/2018 that showed  IMPRESSION: 1. Residual contrast material throughout the colon limits examination. 2. No acute process demonstrated in the abdomen or pelvis. No evidence of bowel obstruction or inflammation. 3. Suggestion of patchy airspace infiltrates in the left lung base possibly indicating pneumonia.  Pt afebrile.  Suspect thrombocytopenia multifactorial due to reported ibuprofen use as well as LFT abnormalities.  She also had platelet clumping noted on smear.  Have requested platelet count in citrated tube.    Due to history of ITP, pt will be given trial of steroids with Prednisone 60 mg po daily with food.  Will monitor counts and arrange outpatient follow-up for ongoing monitoring of labs.     2.  Iron deficiency anemia.  CT shows no significant abdominal  abnormalities.  Will arrange for outpt follow-up for IV iron. GI referral as outpatient.    3.  Elevated LFTs.  ALT 195 on labs done today.  Likely due to shock and hypotension.  Will continue to monitor.    4.  Reported HIV+.  Recent HIV testing was negative.  If Hiv+ this can also affect plt counts.  CD4 counts pending per primary team.    5.  ? LLL pneumonia.  Pt afebrile.  On abx per primary team.    6. Polypharmacy and reported substance abuse.  Follow-up with PCP.  May have to involve social work to arrange for outpatient monitoring.    60 minutes spent with more than 50% spent in review of records, counseling and coordination of care.

## 2018-09-23 NOTE — Progress Notes (Signed)
PROGRESS NOTE    Amber Faulkner  KGU:[id number] DOB: May 26, 1961 DOA: 09/19/2018 PCP: Hali Marry, MD   Brief Narrative:  The patient is a 58 year old female brought from home, found on the floor.  She is unable to give any history.  She was in the hospital in the past, chart review shows that she has history of seizure, hypertension, type 2 diabetes, questionable history of HIV, so far negative HIV test on our records from 4 years ago.On arrival to the emergency room, she was with significant aphasia.  Previous records also indicate that she is on chronic pain medications and anxiety medications.  Patient was just repeating sentences.  There was no focal deficit.  She was found with mild LFT elevation.  CT scan of the abdomen did not show any significant findings.  Chest x-ray with suspected pneumonia.  MRI showed bilateral nonspecific changes  **Interim History Patient underwent EEG Testing and this AM woke up and was alert and oriented. Neurology evaluated and signed off case. Platelet Count started dropping and she has a Hx of ITP so case was discussed with Hematology and recommended repeating Platelet Count on Citrate in AM and watching for bleeding. Because platelets dropped further Hematology was formally consulted. Patient was also complaining of Right Foot pain and states that she "broke my foot recently" so will order a foot X-Ray.   Assessment & Plan:   Principal Problem:   Acute encephalopathy Active Problems:   Essential hypertension   Uncontrolled type 2 diabetes mellitus with diabetic polyneuropathy, with long-term current use of insulin (HCC)   Abdominal pain   Elevated LFTs   Encephalopathy acute  Acute Metabolic Encephalopathy, improved  -Cause unknown.   -Acute infection versus polypharmacy or possibility of seizure and postictal state. Continue monitoring.   -Neurochecks every 4 hours.   -Patient is now awake enough, will allow her to eat under supervision.  -EEG as below.   -Changed to IV seizure medications by neurology for better absorption and switched back to po. -Suspected polypharmacy, discontinuing all sedating medications. -Treating as presumptive pneumonia and is was Cipro/Flagyl which I stopped -Repeat CXR in AM showed Unchanged appearance of the chest with streaky LEFT basilar opacities again noted. -TSH was 2.67 -Check RPR, B12 (616), B1 Levels  -Checked UDS and was Negative  -MRI showed "Ill-defined white matter lesions with reduced diffusion are present within the left-greater-than-right inferior frontal gyri as well as the splenium of corpus callosum. The pattern is atypical for ischemic infarction. There is a broad differential, for example transit metabolic derangement, demyelination, seizure, drug toxicity (antiepileptic, metronidazole, sympathomimetic.), ETOH encephalopathy, or hypoglycemia.Ther was also Background of nonspecific white matter hyperintensities may represent chronic sequelae of the acute process described above or age advanced chronic microvascular ischemic changes. -Patient is now A and O x4  -Neurology signed off and continue to Monitor her Neurological Status   Left Lower Pneumonia COPD -Admission CXR showed "Regressed but not fully resolved mild left lung base opacity since February. No new cardiopulmonary abnormality." -Repeat CXR showed "Unchanged appearance of the chest with streaky LEFT basilar opacities again noted." -Presented with leukocytosis and cough.   -She is a smoker with COPD.  Was treated as Pneumonia but Abx have now stoppedd .   -There was concern about intra-abdominal infection, she was on Ciprofloxacin and Flagyl which is now D/C'd -Repeat CXR intermittently  -C/w Budesonide 0.25 mg Neb BID and DuoNeb q6h and q2hprn Wheezing  -Currently her Advair Diskus, Spirivia, and Albuterol Inhalers  are bing held but will resume at D/C   Abnormal LFTs, improving -Cause unknown.   -CT scan of the  abdomen was fairly normal.   -She had no evidence of biliary obstruction.  No cholecystitis.  -On repeat chemistry panel, bilirubin is normal.   -Patient have might have transient transaminase elevation from shock or hypotension. -LFTs are now trending down and AST went from 407 -> 184 -> 66 -> 32 and ALT went from 593 -> 462 -> 290 -> 195 -T Bili was 1.1 and improved to 0.5 -Hepatitis panel showed HCV Ab >11.0 so will check Genotype and RNA Quant; HCV Quantitative Not Detected and Genotype was "RTNI" -Continue to Hold Home Statin for now until LFTs are improved -Continue to Monitor and Trend LFTs  Leukocytosis -Worsening. WBC went from 13.8 -> 14.5 -> 15.6 -> 14.4 -> 14.2 -Unclear Etiology -Abx with Cipro/Flagyl now D/C'd  -Continue to Monitor for S/Sx of Infection -Urinalysis showed Large Hgb, 80 Ketones and >50 RBC/HPF and 6-10 WBC and Negative Leukocytes -Urine Cx showed No Growth -MRSA PCR was Negative -Continue to Monitor and Repeat CBC in AM  Thrombocytopenia in the Setting of Prior Chronic ITP -Patient's Platelet Count dropped from 150 -> 106 -> 78 -> 64 -Patient has had a Splenectomy and two IVIG's previously -Obtained LDH (199) and Reticulocyte Count and Smear -Discussed the case with Oncology Dr. Walden Field who recommended Stopping Enoxaparin (Not on her [facility]) and other medications that can induce Thrombocytopenia and repeating Platelet Count in Citrate in AM -She recommends to continue to Monitor for S/Sx of Bleeding -Has had Treatment of Steroids (Dexamethasone) with IVIG x2 along with Rituximab  -Patient does have bruising on Right Hip and Legs -Since Platelet Count dropped further will consult Hematology and Dr. Walden Field to see the patient   Diabetes Mellitus Type 2 -CBG's ranging from 104-242 -Change Sensitive Novolog SSI q4h to AC/HS -Resume Home Insulin Detemir but at a reduced dose of 10 units BID -Checked HbA1c and was 6.2  Essential Hypertension -Blood pressures are  fairly normal and her Losartan 25 mg po Daily is being  Polypharmacy -Holding all reported medications that are causing sedation including Carisoprodol, Gabapentin, Duloxetine, Oxycodone, Quetiapine and Venlafaxine XR  Seizure Disorder -EEG done and Impression was "This is an abnormal electroencephalogram secondary to general background slowing and bihemispheric periodic discharges of triphasic morphology (left greater than right). These are seen most commonly in encephalopathic states, although can not rule out other possibilities such as seizure" -Neurology consulted and recommending continuing Vimpat at her Home dose as well as continuing and restarting Topiromate -Currently was on 100 mg q12h of Lacosomide and will change to po  -Upon further MAR review there is no Topiromate listed on it -Neurology stating outpatient Follow up for her Epilepsy as well as MRI in 3 months   History of ?HIV -There are many places in the chart that says patient has history of HIV.   -HIV test in 2016 was negative.   -Will repeat before proceeding to check CD4 counts and repeat this Admission was Non-Reactive   Hypokalemia -Patient's K+ was 3.2 and repeat this AM was 3.7 -Continue to Monitor and Replete as Necessary -Repeat CMP in AM  Hypophosphatemia -Patient's Phos Level this AM was 2.4 and improved to 3.9 -Continue to Monitor and Replete as Necessary -Repeat Phos Level in AM   Hypomagnesemia  -Patient's Mag Level was 1.9 -Continue to Monitor and Replete as Necessary  -Repeat Mag Level in AM  Microcytic Anemia  -Patient's Hb/Hct went from 8.8/30.9 -> 9.2/31.3 ->8.6/29.0 -> 8.1/28.7 -Checked Anemia Panel and showed iron level of 13, U IBC of 4 4, TIBC of 417, saturation ratio of 3%, ferritin level of 28, folate level 18.1, and vitamin B12 level of 616 -Resume Home Ferrous Sulfate 325 mg po Daily  -Continue to Monitor for S/Sx of Bleeding -Repeat CBC in AM  -Hematology also consulted for  Thrombocytopenia   ADD/Anxiety/Chornic Pain/?Neuromuscular Disorder -Currently holding Soma, Duloxetine, Gabapentin, Oxycodone, and Quetiapine as well as Venlafaxine and will add back slowly   MS -Diagnosed in 2016 -MRI done and It did not show any enhancement but showed multiple areas of restricted diffusion, not characteristic of strokes in b/l cerebral hemispheres and also in corpus callosum with a broad differential of etiology -Neurology has already evaluated  Right Foot Pain -Check Right Foot X-Ray -States she was recently diagnosed with a Broken foot and was wearing a boot -Pain Control with Tramadol   DVT prophylaxis: SCDs Code Status: FULL CODE Family Communication: No family present at bedside  Disposition Plan: Anticipate D/C home after improvement in Platelets and clearance by Hematology  Consultants:   Neurology   Hematology   Procedures:  EEG Description:  The background activity is slow and poorly organized.  It consists of low voltage, polymorphic delta activity that is continuous and diffusely distributed.  There are frequent periodic discharges of triphasic morphology that are seen bihemispherically, but more common over the left hemisphere.    Hyperventilation and intermittent photic stimulation were not performed.   IMPRESSION: This is an abnormal electroencephalogram secondary to general background slowing and bihemispheric periodic discharges of triphasic morphology (left greater than right). These are seen most commonly in encephalopathic states, although can not rule out other possibilities such as seizure.  Clinical correlation recommended.         Antimicrobials:  Anti-infectives (From admission, onward)   Start     Dose/Rate Route Frequency Ordered Stop   09/20/18 0600  metroNIDAZOLE (FLAGYL) IVPB 500 mg  Status:  Discontinued     500 mg 100 mL/hr over 60 Minutes Intravenous Every 8 hours 09/20/18 0116 09/22/18 0935   09/20/18 0130   ciprofloxacin (CIPRO) IVPB 400 mg  Status:  Discontinued     400 mg 200 mL/hr over 60 Minutes Intravenous Every 12 hours 09/20/18 0121 09/22/18 0935   09/19/18 2245  doxycycline (VIBRAMYCIN) 100 mg in sodium chloride 0.9 % 250 mL IVPB     100 mg 125 mL/hr over 120 Minutes Intravenous  Once 09/19/18 2239 09/20/18 0300   09/19/18 2115  ciprofloxacin (CIPRO) IVPB 400 mg  Status:  Discontinued     400 mg 200 mL/hr over 60 Minutes Intravenous  Once 09/19/18 2107 09/19/18 2239   09/19/18 2115  metroNIDAZOLE (FLAGYL) IVPB 500 mg  Status:  Discontinued     500 mg 100 mL/hr over 60 Minutes Intravenous  Once 09/19/18 2107 09/19/18 2239     Subjective: Seen and examined at bedside and she forgot to mention that her right foot was hurting yesterday and states that she recently found out that is broken.  No nausea or vomiting.  Having some bruising on her lower extremities.  No chest pain,.  Discussed with her that hematology will be formally consulted and she is happy about this.  No other concerns or complaints at this time  Objective: Vitals:   09/22/18 2330 09/23/18 0614 09/23/18 0730 09/23/18 0814  BP: 134/80  130/81   Pulse: 79  69 75  Resp: 16  15 16   Temp: 98.7 F (37.1 C)  97.8 F (36.6 C)   TempSrc: Oral  Oral   SpO2: 100%  97% 97%  Weight:  78.2 kg    Height:  5\' 5"  (1.651 m)      Intake/Output Summary (Last 24 hours) at 09/23/2018 1129 Last data filed at 09/23/2018 0830 Gross per 24 hour  Intake 50 ml  Output -  Net 50 ml   Filed Weights   09/20/18 0345 09/22/18 0602 09/23/18 0614  Weight: 79.5 kg 78.2 kg 78.2 kg   Examination: Physical Exam:  Constitutional: Well-nourished, well-developed overweight Caucasian female currently no acute distress appears calm sitting up in bed Eyes: Lids and conjunctive are normal.  Sclera is anicteric ENMT: External ears and nose appear normal.  Grossly normal hearing Neck: Supple no JVD Respiratory: Diminished auscultation bilaterally  no appreciable wheezing, rales, rhonchi.  Patient not tachypneic wheezing and accessory muscle breathe Cardiovascular: RRR rate and rhythm.  No appreciable murmurs, rubs or gallops. No LE edema noted Abdomen: Soft, nontender, distended slightly secondary body habitus.  Bowel sounds present GU: Deferred Musculoskeletal: Fractures cyanosis.  No joint deformities noted but does have some tenderness to palpate on the dorsal aspect of her right foot Skin:.  Does have some lower extremity bruising bilaterally on legs and also a bruise on the right hip.  Neurologic: Cranial Nerves II through XII grossly intact no appreciable focal deficits Psychiatric: Normal Judgment and insight.  Patient is awake, alert, and oriented x3.  Pleasant mood and affect  Data Reviewed: I have personally reviewed following labs and imaging studies  CBC: Recent Labs  Lab 09/19/18 1949 09/19/18 2007 09/20/18 0501 09/21/18 0325 09/22/18 0259 09/23/18 0316  WBC 13.8*  --  14.5* 15.6* 14.4* 14.2*  NEUTROABS 9.5*  --  9.3* 10.5* 8.1* 7.7  HGB 9.1* 10.9* 8.8* 9.2* 8.6* 8.1*  HCT 32.7* 32.0* 30.9* 31.3* 29.0* 28.7*  MCV 76.4*  --  75.9* 74.5* 73.8* 75.1*  PLT 150  --  PLATELET CLUMPS NOTED ON SMEAR, UNABLE TO ESTIMATE 106* 78* 64*   Basic Metabolic Panel: Recent Labs  Lab 09/19/18 1949 09/19/18 2007 09/20/18 0501 09/21/18 0325 09/21/18 0908 09/22/18 0259 09/23/18 0316  NA 139 138 139 138  --  137 138  K 3.7 3.9 3.8 3.2*  --  3.6 3.7  CL 103  --  101 105  --  107 108  CO2 25  --  24 21*  --  22 21*  GLUCOSE 109*  --  105* 136*  --  102* 150*  BUN 20  --  17 13  --  17 15  CREATININE 0.67  --  0.75 0.64  --  0.54 0.56  CALCIUM 8.3*  --  8.5* 8.4*  --  8.3* 8.6*  MG  --   --   --   --  1.7 1.6* 1.9  PHOS  --   --   --   --  2.4* 3.3 3.8   GFR: Estimated Creatinine Clearance: 82.2 mL/min (by C-G formula based on SCr of 0.56 mg/dL). Liver Function Tests: Recent Labs  Lab 09/19/18 1949 09/20/18 0501  09/21/18 0325 09/22/18 0259 09/23/18 0316  AST 407* 323* 184* 66* 32  ALT 593* 564* 462* 290* 195*  ALKPHOS 120 118 112 91 89  BILITOT 1.0 1.2 1.1 0.6 0.5  PROT 6.1* 6.3* 6.0* 5.7* 5.8*  ALBUMIN 3.3* 3.2* 3.1* 3.1* 3.2*   Recent Labs  Lab 09/19/18 1949  LIPASE 17   Recent Labs  Lab 09/19/18 1949  AMMONIA 14   Coagulation Profile: No results for input(s): INR, PROTIME in the last 168 hours. Cardiac Enzymes: No results for input(s): CKTOTAL, CKMB, CKMBINDEX, TROPONINI in the last 168 hours. BNP (last 3 results) No results for input(s): PROBNP in the last 8760 hours. HbA1C: Recent Labs    09/22/18 0259  HGBA1C 6.2*   CBG: Recent Labs  Lab 09/22/18 1155 09/22/18 1659 09/22/18 1953 09/22/18 2327 09/23/18 0731  GLUCAP 116* 242* 124* 109* 104*   Lipid Profile: No results for input(s): CHOL, HDL, LDLCALC, TRIG, CHOLHDL, LDLDIRECT in the last 72 hours. Thyroid Function Tests: Recent Labs    09/21/18 0908  TSH 2.667   Anemia Panel: Recent Labs    09/21/18 0908 09/22/18 1021  VITAMINB12 616  --   FOLATE 18.1  --   FERRITIN 28  --   TIBC 417  --   IRON 13*  --   RETICCTPCT 0.6 1.1   Sepsis Labs: Recent Labs  Lab 09/19/18 1949 09/19/18 2311  LATICACIDVEN 1.0 0.9    Recent Results (from the past 240 hour(s))  Urine culture     Status: None   Collection Time: 09/19/18 10:25 PM  Result Value Ref Range Status   Specimen Description URINE, RANDOM  Final   Special Requests NONE  Final   Culture   Final    NO GROWTH Performed at [facility], [place] [address] [place], [place] [address]    Report Status 09/21/2018 FINAL  Final  MRSA PCR Screening     Status: None   Collection Time: 09/20/18  2:10 AM  Result Value Ref Range Status   MRSA by PCR NEGATIVE NEGATIVE Final    Comment:        The GeneXpert MRSA Assay (FDA approved for NASAL specimens only), is one component of a comprehensive MRSA colonization surveillance program. It is not  intended to diagnose MRSA infection nor to guide or monitor treatment for MRSA infections. Performed at [facility], [place] [address], [place], [place] [address]     Estimated body mass index is 28.69 kg/m as calculated from the following:   Height as of this encounter: 5\' 5"  (1.651 m).   Weight as of this encounter: 78.2 kg.   Radiology Studies: Dg Chest Port 1 View  Result Date: 09/22/2018 CLINICAL DATA:  Cough and shortness of breath. EXAM: PORTABLE CHEST 1 VIEW COMPARISON:  09/19/2010 FINDINGS: The cardiomediastinal silhouette is unremarkable. Streaky LEFT basilar opacities are unchanged. There is no evidence of pulmonary edema, suspicious pulmonary nodule/mass, pleural effusion, or pneumothorax. No acute bony abnormalities are identified. Cervical spine fusion changes again noted. IMPRESSION: Unchanged appearance of the chest with streaky LEFT basilar opacities again noted. Electronically Signed   By: Margarette Canada M.D.   On: 09/22/2018 07:41   Scheduled Meds: . budesonide (PULMICORT) nebulizer solution  0.25 mg Nebulization BID  . calcium carbonate  1,500 mg Oral BID WC  . cholecalciferol  1,000 Units Oral Daily  . ferrous sulfate  325 mg Oral Daily  . insulin aspart  0-5 Units Subcutaneous QHS  . insulin aspart  0-9 Units Subcutaneous TID WC  . insulin detemir  10 Units Subcutaneous BID  . ipratropium-albuterol  3 mL Nebulization BID  . lacosamide  100 mg Oral BID  . pantoprazole  40 mg Oral Daily  . vitamin B-12  1,000 mcg Oral Daily   Continuous  Infusions:   LOS: 3 days   Kerney Elbe, DO [facility] PAGER is on [place]  If 7PM-7AM, please contact night-coverage [email or web address] Password [facility] 09/23/2018, 11:29 AM

## 2018-09-23 NOTE — Progress Notes (Signed)
Daily Nursing Note  Received report from [place]. Introduced self to patient who stated she has been feeling well though remains concerns about her decreasing platelet levels. Hematology, Dr. Walden Field consulted for thrombocytopenia --> started on prednisone '60mg'$  PO QDay. R foot xray completed for pain in foot and endorsement of prior fracture. Pain poorly controlled with tramadol, alerted primary and transitioned to oxycodone. BS under fair control throughout the day 104-->175-->164. All care needs met during shift.

## 2018-09-24 ENCOUNTER — Inpatient Hospital Stay (HOSPITAL_COMMUNITY): Payer: Medicare Other

## 2018-09-24 LAB — CBC WITH DIFFERENTIAL/PLATELET
Abs Immature Granulocytes: 0.09 10*3/uL — ABNORMAL HIGH (ref 0.00–0.07)
BASOS ABS: 0.1 10*3/uL (ref 0.0–0.1)
Basophils Relative: 0 %
Eosinophils Absolute: 0 10*3/uL (ref 0.0–0.5)
Eosinophils Relative: 0 %
HCT: 27.9 % — ABNORMAL LOW (ref 36.0–46.0)
HEMOGLOBIN: 8.2 g/dL — AB (ref 12.0–15.0)
IMMATURE GRANULOCYTES: 1 %
Lymphocytes Relative: 18 %
Lymphs Abs: 3.1 10*3/uL (ref 0.7–4.0)
MCH: 22 pg — ABNORMAL LOW (ref 26.0–34.0)
MCHC: 29.4 g/dL — ABNORMAL LOW (ref 30.0–36.0)
MCV: 74.8 fL — ABNORMAL LOW (ref 80.0–100.0)
Monocytes Absolute: 2.1 10*3/uL — ABNORMAL HIGH (ref 0.1–1.0)
Monocytes Relative: 13 %
NRBC: 3.7 % — AB (ref 0.0–0.2)
Neutro Abs: 11.3 10*3/uL — ABNORMAL HIGH (ref 1.7–7.7)
Neutrophils Relative %: 68 %
Platelets: 67 10*3/uL — ABNORMAL LOW (ref 150–400)
RBC: 3.73 MIL/uL — ABNORMAL LOW (ref 3.87–5.11)
RDW: 25.7 % — ABNORMAL HIGH (ref 11.5–15.5)
WBC: 16.6 10*3/uL — ABNORMAL HIGH (ref 4.0–10.5)

## 2018-09-24 LAB — COMPREHENSIVE METABOLIC PANEL
ALT: 146 U/L — ABNORMAL HIGH (ref 0–44)
AST: 22 U/L (ref 15–41)
Albumin: 3.3 g/dL — ABNORMAL LOW (ref 3.5–5.0)
Alkaline Phosphatase: 97 U/L (ref 38–126)
Anion gap: 6 (ref 5–15)
BUN: 14 mg/dL (ref 6–20)
CO2: 21 mmol/L — ABNORMAL LOW (ref 22–32)
CREATININE: 0.52 mg/dL (ref 0.44–1.00)
Calcium: 9.3 mg/dL (ref 8.9–10.3)
Chloride: 108 mmol/L (ref 98–111)
GFR calc Af Amer: >60 mL/min (ref >60)
GFR calc non Af Amer: >60 mL/min (ref >60)
Glucose, Bld: 235 mg/dL — ABNORMAL HIGH (ref 70–99)
Potassium: 4.3 mmol/L (ref 3.5–5.1)
SODIUM: 135 mmol/L (ref 135–145)
TOTAL PROTEIN: 5.9 g/dL — AB (ref 6.5–8.1)
Total Bilirubin: 0.6 mg/dL (ref 0.3–1.2)

## 2018-09-24 LAB — GLUCOSE, CAPILLARY
Glucose-Capillary: 133 mg/dL — ABNORMAL HIGH (ref 70–99)
Glucose-Capillary: 296 mg/dL — ABNORMAL HIGH (ref 70–99)
Glucose-Capillary: 282 mg/dL — ABNORMAL HIGH (ref 70–99)
Glucose-Capillary: 143 mg/dL — ABNORMAL HIGH (ref 70–99)

## 2018-09-24 LAB — VITAMIN B1
Vitamin B1 (Thiamine): 161.3 nmol/L (ref 66.5–200.0)
Vitamin B1 (Thiamine): 165.1 nmol/L (ref 66.5–200.0)

## 2018-09-24 LAB — MAGNESIUM: Magnesium: 1.8 mg/dL (ref 1.7–2.4)

## 2018-09-24 LAB — PHOSPHORUS: Phosphorus: 4.7 mg/dL — ABNORMAL HIGH (ref 2.5–4.6)

## 2018-09-24 NOTE — Progress Notes (Addendum)
Daily Nursing Note  Receive report from [place], [place]. Patient endorses she feels well today with the exception of chronic back and R foot pain which required two doses of PO oxycodone 44m. R foot CT ordered, orthopaedic team evaluated patient today --> identifed that a boot for stabilization  will not be needed and that she can be WBAT. Patient mobilized around the unit w/o complicated. Plt levels remains stable as compared to yesterday 64 --> 67 and patient remains on 662mPO of prednisone. BS with variable control in the setting of recent steroid initiation -->133-->296-->282. All patient care needs met during shift.

## 2018-09-24 NOTE — Consult Note (Addendum)
Reason for Consult:Right foot fx Referring Physician: Claybon Jabs  Amber Faulkner is an 58 y.o. female.  HPI: Amber Faulkner fell in January after suffering a seizure and landed her body weight on her right foot. She went to urgent care and was diagnosed with a foot fracture. She was evaluated at [facility] in [place] on 07-20-18, where a CT scan was ordered and completed on 07-27-18.  She has not kept her follow-up re-evaluations.  She was given a cam walker boot and wore that religiously for a while but has been WBAT both in and out of the boot recently. She c/o pain in the foot but no radiation or N/T.  Past Medical History:  Diagnosis Date  . ADD (attention deficit disorder)   . Anxiety   . Chronic pain   . Diabetes mellitus   . HIV infection ([place])   . Hyperlipidemia   . Neuromuscular disorder ([place])   . Pneumonia 09/2018    Past Surgical History:  Procedure Laterality Date  . ABDOMINAL HYSTERECTOMY  1991   complete  . CARPAL TUNNEL RELEASE  1993-1994   both wrist  . CERVICAL DISCECTOMY  03/25/2017   Dr. Cyndia Diver, [place]  . CESAREAN SECTION  1980  . HERNIA REPAIR  1976  . SPLENECTOMY  09/2016  . TARSAL TUNNEL RELEASE  1993   left ankle  . TUMOR REMOVAL  1993   left thigh    Family History  Problem Relation Age of Onset  . Cancer Sister        hepatic and pancreatic  . Cancer Mother        brain and lung  . COPD Mother   . Hyperlipidemia Father   . Hypertension Father   . Diabetes Father   . Heart disease Father   . Cancer Maternal Aunt        breast    Social History:  reports that she has been smoking cigarettes. She has been smoking about 1.50 packs per day. She has never used smokeless tobacco. She reports that she does not drink alcohol or use drugs.  Allergies:  Allergies  Allergen Reactions  . Cephalosporins Anaphylaxis  . Vancomycin Anaphylaxis  . Penicillins Hives and Rash  . Ampicillin Other (See Comments)    Other reaction(s):  anaphylaxis  . Lisinopril Other (See Comments)    Nausea,dizziness,headache  . Penicillin G Other (See Comments)    Other reaction(s): anaphylaxis    Medications: I have reviewed the patient's current medications.  Results for orders placed or performed during the hospital encounter of 09/19/18 (from the past 48 hour(s))  Glucose, capillary     Status: Abnormal   Collection Time: 09/22/18 11:55 AM  Result Value Ref Range   Glucose-Capillary 116 (H) 70 - 99 mg/dL  Glucose, capillary     Status: Abnormal   Collection Time: 09/22/18  4:59 PM  Result Value Ref Range   Glucose-Capillary 242 (H) 70 - 99 mg/dL  Glucose, capillary     Status: Abnormal   Collection Time: 09/22/18  7:53 PM  Result Value Ref Range   Glucose-Capillary 124 (H) 70 - 99 mg/dL  Glucose, capillary     Status: Abnormal   Collection Time: 09/22/18 11:27 PM  Result Value Ref Range   Glucose-Capillary 109 (H) 70 - 99 mg/dL  Platelet by [place]     Status: None   Collection Time: 09/23/18  3:16 AM  Result Value Ref Range   Platelet CT in [place] PLATELET  CLUMPS NOTED ON SMEAR, UNABLE TO ESTIMATE     Comment: Performed at [facility] [address], [place], [place] [address]  CBC with Differential/Platelet     Status: Abnormal   Collection Time: 09/23/18  3:16 AM  Result Value Ref Range   WBC 14.2 (H) 4.0 - 10.5 K/uL   RBC 3.82 (L) 3.87 - 5.11 MIL/uL   Hemoglobin 8.1 (L) 12.0 - 15.0 g/dL    Comment: Reticulocyte Hemoglobin testing may be clinically indicated, consider ordering this additional test [id number]    HCT 28.7 (L) 36.0 - 46.0 %   MCV 75.1 (L) 80.0 - 100.0 fL   MCH 21.2 (L) 26.0 - 34.0 pg   MCHC 28.2 (L) 30.0 - 36.0 g/dL   RDW 26.0 (H) 11.5 - 15.5 %   Platelets 64 (L) 150 - 400 K/uL    Comment: REPEATED TO VERIFY PLATELET COUNT CONFIRMED BY SMEAR SPECIMEN CHECKED FOR CLOTS Immature Platelet Fraction may be clinically indicated, consider ordering this additional test [id number]    nRBC  10.7 (H) 0.0 - 0.2 %   Neutrophils Relative % 53 %   Neutro Abs 7.7 1.7 - 7.7 K/uL   Lymphocytes Relative 23 %   Lymphs Abs 3.2 0.7 - 4.0 K/uL   Monocytes Relative 19 %   Monocytes Absolute 2.6 (H) 0.1 - 1.0 K/uL   Eosinophils Relative 3 %   Eosinophils Absolute 0.4 0.0 - 0.5 K/uL   Basophils Relative 1 %   Basophils Absolute 0.2 (H) 0.0 - 0.1 K/uL   Immature Granulocytes 1 %   Abs Immature Granulocytes 0.11 (H) 0.00 - 0.07 K/uL   Schistocytes PRESENT    Tear Drop Cells PRESENT    Polychromasia PRESENT     Comment: Performed at [facility], [address], [place], [place] [address]  Comprehensive metabolic panel     Status: Abnormal   Collection Time: 09/23/18  3:16 AM  Result Value Ref Range   Sodium 138 135 - 145 mmol/L   Potassium 3.7 3.5 - 5.1 mmol/L   Chloride 108 98 - 111 mmol/L   CO2 21 (L) 22 - 32 mmol/L   Glucose, Bld 150 (H) 70 - 99 mg/dL   BUN 15 6 - 20 mg/dL   Creatinine, Ser 0.56 0.44 - 1.00 mg/dL   Calcium 8.6 (L) 8.9 - 10.3 mg/dL   Total Protein 5.8 (L) 6.5 - 8.1 g/dL   Albumin 3.2 (L) 3.5 - 5.0 g/dL   AST 32 15 - 41 U/L   ALT 195 (H) 0 - 44 U/L   Alkaline Phosphatase 89 38 - 126 U/L   Total Bilirubin 0.5 0.3 - 1.2 mg/dL   GFR calc non Af Amer >60 >60 mL/min   GFR calc Af Amer >60 >60 mL/min   Anion gap 9 5 - 15    Comment: Performed at [facility], [place] [address], [place], [place] [address]  Magnesium     Status: None   Collection Time: 09/23/18  3:16 AM  Result Value Ref Range   Magnesium 1.9 1.7 - 2.4 mg/dL    Comment: Performed at [facility] [address] [place] , [place] [address]  Phosphorus     Status: None   Collection Time: 09/23/18  3:16 AM  Result Value Ref Range   Phosphorus 3.8 2.5 - 4.6 mg/dL    Comment: Performed at [facility] [address] [place], [place] [address]  Glucose,  capillary     Status: Abnormal   Collection Time: 09/23/18  7:31 AM  Result Value Ref Range   Glucose-Capillary 104 (H) 70 -  99 mg/dL  Glucose, capillary     Status: Abnormal   Collection Time: 09/23/18 11:53 AM  Result Value Ref Range   Glucose-Capillary 175 (H) 70 - 99 mg/dL  Glucose, capillary     Status: Abnormal   Collection Time: 09/23/18  4:20 PM  Result Value Ref Range   Glucose-Capillary 164 (H) 70 - 99 mg/dL  Glucose, capillary     Status: Abnormal   Collection Time: 09/23/18  9:10 PM  Result Value Ref Range   Glucose-Capillary 286 (H) 70 - 99 mg/dL  CBC with Differential/Platelet     Status: Abnormal   Collection Time: 09/24/18  3:10 AM  Result Value Ref Range   WBC 16.6 (H) 4.0 - 10.5 K/uL   RBC 3.73 (L) 3.87 - 5.11 MIL/uL   Hemoglobin 8.2 (L) 12.0 - 15.0 g/dL    Comment: Reticulocyte Hemoglobin testing may be clinically indicated, consider ordering this additional test [id number]    HCT 27.9 (L) 36.0 - 46.0 %   MCV 74.8 (L) 80.0 - 100.0 fL   MCH 22.0 (L) 26.0 - 34.0 pg   MCHC 29.4 (L) 30.0 - 36.0 g/dL   RDW 25.7 (H) 11.5 - 15.5 %   Platelets 67 (L) 150 - 400 K/uL    Comment: Immature Platelet Fraction may be clinically indicated, consider ordering this additional test [id number] CONSISTENT WITH PREVIOUS RESULT    nRBC 3.7 (H) 0.0 - 0.2 %   Neutrophils Relative % 68 %   Neutro Abs 11.3 (H) 1.7 - 7.7 K/uL   Lymphocytes Relative 18 %   Lymphs Abs 3.1 0.7 - 4.0 K/uL   Monocytes Relative 13 %   Monocytes Absolute 2.1 (H) 0.1 - 1.0 K/uL   Eosinophils Relative 0 %   Eosinophils Absolute 0.0 0.0 - 0.5 K/uL   Basophils Relative 0 %   Basophils Absolute 0.1 0.0 - 0.1 K/uL   Immature Granulocytes 1 %   Abs Immature Granulocytes 0.09 (H) 0.00 - 0.07 K/uL   Schistocytes PRESENT    Polychromasia PRESENT     Comment: Performed at [facility], [address], [place], [place] [address]  Comprehensive metabolic panel     Status: Abnormal   Collection Time: 09/24/18  3:10 AM  Result Value Ref Range   Sodium 135 135 - 145 mmol/L   Potassium 4.3 3.5 - 5.1 mmol/L   Chloride 108 98 - 111  mmol/L   CO2 21 (L) 22 - 32 mmol/L   Glucose, Bld 235 (H) 70 - 99 mg/dL   BUN 14 6 - 20 mg/dL   Creatinine, Ser 0.52 0.44 - 1.00 mg/dL   Calcium 9.3 8.9 - 10.3 mg/dL   Total Protein 5.9 (L) 6.5 - 8.1 g/dL   Albumin 3.3 (L) 3.5 - 5.0 g/dL   AST 22 15 - 41 U/L   ALT 146 (H) 0 - 44 U/L   Alkaline Phosphatase 97 38 - 126 U/L   Total Bilirubin 0.6 0.3 - 1.2 mg/dL   GFR calc non Af Amer >60 >60 mL/min   GFR calc Af Amer >60 >60 mL/min   Anion gap 6 5 - 15    Comment: Performed at [facility], [place] [address] [place], [place] [address]  Magnesium     Status: None   Collection Time: 09/24/18  3:10 AM  Result Value Ref Range   Magnesium 1.8 1.7 - 2.4 mg/dL    Comment: Performed at [facility] [address] [place], [place] [address]  Phosphorus     Status: Abnormal   Collection Time: 09/24/18  3:10 AM  Result Value Ref Range   Phosphorus 4.7 (H) 2.5 - 4.6 mg/dL    Comment: Performed at [facility] [address] [place], [place] [address]  Glucose, capillary     Status: Abnormal   Collection Time: 09/24/18  7:57 AM  Result Value Ref Range   Glucose-Capillary 133 (H) 70 - 99 mg/dL    Dg Foot 2 Views Right  Result Date: 09/23/2018 CLINICAL DATA:  Foot pain.  History of prior injury in January. EXAM: RIGHT FOOT - 2 VIEW COMPARISON:  None. FINDINGS: There is a fracture involving the base of the second metatarsal medially. There is also widening of the joint space between the middle and medial cuneiforms and slight lateral subluxation of the first metatarsal base in relation to the medial cuneiform. Findings highly suspicious for Lisfranc ligament tear/rupture. There could be other fractures that are not well demonstrated on the plain films. Mild pes cavus.  Small calcaneal heel spur. IMPRESSION: Plain film findings suspicious for a Lisfranc ligament disruption and fracture at the base of the second metatarsal. No other definite fractures are identified but CT may be  helpful for further evaluation. Electronically Signed   By: Marijo Sanes M.D.   On: 09/23/2018 13:29    Review of Systems  Constitutional: Negative for weight loss.  HENT: Negative for ear discharge, ear pain, hearing loss and tinnitus.   Eyes: Negative for blurred vision, double vision, photophobia and pain.  Respiratory: Negative for cough, sputum production and shortness of breath.   Cardiovascular: Negative for chest pain.  Gastrointestinal: Negative for abdominal pain, nausea and vomiting.  Genitourinary: Negative for dysuria, flank pain, frequency and urgency.  Musculoskeletal: Positive for joint pain (Right foot). Negative for back pain, falls, myalgias and neck pain.  Neurological: Negative for dizziness, tingling, sensory change, focal weakness, loss of consciousness and headaches.  Endo/Heme/Allergies: Does not bruise/bleed easily.  Psychiatric/Behavioral: Negative for depression, memory loss and substance abuse. The patient is not nervous/anxious.    Blood pressure 116/71, pulse 71, temperature 97.9 F (36.6 C), temperature source Oral, resp. rate 16, height 5\' 5"  (1.651 m), weight 78.2 kg, SpO2 97 %. Physical Exam  Constitutional: She appears well-developed and well-nourished. No distress.  HENT:  Head: Normocephalic and atraumatic.  Eyes: Conjunctivae are normal. Right eye exhibits no discharge. Left eye exhibits no discharge. No scleral icterus.  Neck: Normal range of motion.  Cardiovascular: Normal rate and regular rhythm.  Respiratory: Effort normal. No respiratory distress.  Musculoskeletal:     Comments: LLE No traumatic wounds, ecchymosis, or rash  Midfoot TTP, mild edema  No knee or ankle effusion  Knee stable to varus/ valgus and anterior/posterior stress  Sens DPN, SPN, TN intact  Motor EHL, ext, flex, evers 5/5  DP 2+, PT 2+  Neurological: She is alert.  Skin: Skin is warm and dry. She is not diaphoretic.  Psychiatric: She has a normal mood and affect. Her  behavior is normal.    Assessment/Plan: Right Lisfranc fx -- Not fully healed. At this point though there is nothing acute to do. She may be WBAT without any special shoe or boot. She should f/u with her already established orthopedic surgeon in [place] upon discharge.  Lisette Abu, PA-C Orthopedic Surgery [phone] 09/24/2018, 10:42 AM   Hx as above.   Healing R foot Lisfranc injury, still somewhat symptomatic and not fully healed. May be WBAT for now, and I recommended f/u with her established Orthopaedist in [place]. Alternatively, she may call our practice for continued care if she desires.  Micheline Rough, [place] [phone]

## 2018-09-24 NOTE — Progress Notes (Signed)
Plt count up to 67.  Multi areas of bruising noted.  C/o foot pain in R foot and swelling noted in left foot.  Xray to r foot confirmed previous fx.  Pt has slight slow thoughts when descriving her needs and gets some words out of context.  Pt rested comfortabvbly.

## 2018-09-24 NOTE — Progress Notes (Signed)
PROGRESS NOTE    Amber Faulkner  QQP:[id number] DOB: Jan 01, 1961 DOA: 09/19/2018 PCP: Hali Marry, MD   Brief Narrative:  The patient is a 58 year old female brought from home, found on the floor.  She is unable to give any history.  She was in the hospital in the past, chart review shows that she has history of seizure, hypertension, type 2 diabetes, questionable history of HIV, so far negative HIV test on our records from 4 years ago.On arrival to the emergency room, she was with significant aphasia.  Previous records also indicate that she is on chronic pain medications and anxiety medications.  Patient was just repeating sentences.  There was no focal deficit.  She was found with mild LFT elevation.  CT scan of the abdomen did not show any significant findings.  Chest x-ray with suspected pneumonia.  MRI showed bilateral nonspecific changes  **Interim History Patient underwent EEG Testing and this AM woke up and was alert and oriented. Neurology evaluated and signed off case. Platelet Count started dropping and she has a Hx of ITP so case was discussed with Hematology and recommended repeating Platelet Count on Citrate in AM and watching for bleeding. Because platelets dropped further Hematology was formally consulted. Patient was also complaining of Right Foot pain and states that she "broke my foot recently" so will order a foot X-Ray. X-Ray Confirmed Right Lisfranc injury that was not fully healed and Orthopedic surgery recommended WBAT. Platelet Count is a little improved and stable.   Assessment & Plan:   Principal Problem:   Acute encephalopathy Active Problems:   Essential hypertension   Uncontrolled type 2 diabetes mellitus with diabetic polyneuropathy, with long-term current use of insulin (HCC)   Abdominal pain   Elevated LFTs   Encephalopathy acute  Acute Metabolic Encephalopathy, improved  -Cause unknown.   -Acute infection versus polypharmacy or possibility of seizure  and postictal state. Continue monitoring.   -Neurochecks every 4 hours.   -Patient is now awake enough, will allow her to eat under supervision. -EEG as below.   -Changed to IV seizure medications by neurology for better absorption and switched back to po. -Suspected polypharmacy, discontinuing all sedating medications. -Treating as presumptive pneumonia and is was Cipro/Flagyl which I stopped -Repeat CXR in AM showed Unchanged appearance of the chest with streaky LEFT basilar opacities again noted. -TSH was 2.67 -Check RPR, B12 (616), B1 Levels  -Checked UDS and was Negative  -MRI showed "Ill-defined white matter lesions with reduced diffusion are present within the left-greater-than-right inferior frontal gyri as well as the splenium of corpus callosum. The pattern is atypical for ischemic infarction. There is a broad differential, for example transit metabolic derangement, demyelination, seizure, drug toxicity (antiepileptic, metronidazole, sympathomimetic.), ETOH encephalopathy, or hypoglycemia.Ther was also Background of nonspecific white matter hyperintensities may represent chronic sequelae of the acute process described above or age advanced chronic microvascular ischemic changes. -Patient is now A and O x4  -Neurology signed off and continue to Monitor her Neurological Status   Left Lower Pneumonia COPD -Admission CXR showed "Regressed but not fully resolved mild left lung base opacity since February. No new cardiopulmonary abnormality." -Repeat CXR showed "Unchanged appearance of the chest with streaky LEFT basilar opacities again noted." -Presented with leukocytosis and cough.   -She is a smoker with COPD.  Was treated as Pneumonia but Abx have now stoppedd .   -There was concern about intra-abdominal infection, she was on Ciprofloxacin and Flagyl which is now D/C'd -Repeat CXR  intermittently  -C/w Budesonide 0.25 mg Neb BID and DuoNeb q6h and q2hprn Wheezing  -Currently her  Advair Diskus, Spirivia, and Albuterol Inhalers are bing held but will resume at D/C   Abnormal LFTs, improving -Cause unknown.   -CT scan of the abdomen was fairly normal.   -She had no evidence of biliary obstruction.  No cholecystitis.  -On repeat chemistry panel, bilirubin is normal.   -Patient have might have transient transaminase elevation from shock or hypotension. -LFTs are now trending down and AST went from 407 -> 184 -> 66 -> 32 -> 22 and ALT went from 593 -> 462 -> 290 -> 195 -> 146 -T Bili was 1.1 and improved to 0.6 -Hepatitis panel showed HCV Ab >11.0 so will check Genotype and RNA Quant; HCV Quantitative Not Detected and Genotype was "RTNI" -Continue to Hold Home Statin for now until LFTs are improved -Continue to Monitor and Trend LFTs  Leukocytosis -Worsening. WBC went from 13.8 -> 14.5 -> 15.6 -> 14.4 -> 14.2 -> 16.6 (In the setting of Steroid Demargination) -Unclear Etiology -Abx with Cipro/Flagyl now D/C'd  -Continue to Monitor for S/Sx of Infection -Urinalysis showed Large Hgb, 80 Ketones and >50 RBC/HPF and 6-10 WBC and Negative Leukocytes -Urine Cx showed No Growth -MRSA PCR was Negative -Check Procalcitonin and LA in AM  -Continue to Monitor and Repeat CBC in AM  Thrombocytopenia in the Setting of Prior Chronic ITP -Patient's Platelet Count dropped from 150 -> 106 -> 78 -> 64 -> 67 -Patient has had a Splenectomy and two IVIG's previously -Obtained LDH (199) and Reticulocyte Count and Smear -Discussed the case with Oncology Dr. Walden Field who recommended Stopping Enoxaparin (Not on her [facility]) and other medications that can induce Thrombocytopenia and repeating Platelet Count in Citrate in AM -She recommends to continue to Monitor for S/Sx of Bleeding -Has had Treatment of Steroids (Dexamethasone) with IVIG x2 along with Rituximab  -Patient does have bruising on Right Hip and Legs -Since Platelet Count dropped further will consult Hematology and Dr. Walden Field to see the  patient   Diabetes Mellitus Type 2 -CBG's ranging from 133-296 -Change Sensitive Novolog SSI q4h to AC/HS -Resume Home Insulin Detemir but at a reduced dose of 10 units BID -Checked HbA1c and was 6.2  Essential Hypertension -Blood pressures are fairly normal and her Losartan 25 mg po Daily is being  Polypharmacy -Holding all reported medications that are causing sedation including Carisoprodol, Gabapentin, Duloxetine, Oxycodone, Quetiapine and Venlafaxine XR  Seizure Disorder -EEG done and Impression was "This is an abnormal electroencephalogram secondary to general background slowing and bihemispheric periodic discharges of triphasic morphology (left greater than right). These are seen most commonly in encephalopathic states, although can not rule out other possibilities such as seizure" -Neurology consulted and recommending continuing Vimpat at her Home dose as well as continuing and restarting Topiromate -Currently was on 100 mg q12h of Lacosomide and will change to po  -Upon further MAR review there is no Topiromate listed on it -Neurology stating outpatient Follow up for her Epilepsy as well as MRI in 3 months   History of ?HIV -There are many places in the chart that says patient has history of HIV.   -HIV test in 2016 was negative.   -Will repeat before proceeding to check CD4 counts and repeat this Admission was Non-Reactive   Hypokalemia -Patient's K+ was 3.2 and repeat this AM was 4.3 -Continue to Monitor and Replete as Necessary -Repeat CMP in AM  Hypophosphatemia -> Hyperphosphatemia  -  Patient's Phos Level was 2.4 and is now 4.7 -Continue to Monitor and Replete as Necessary -Repeat Phos Level in AM   Hypomagnesemia  -Patient's Mag Level was 1.8 -Continue to Monitor and Replete as Necessary  -Repeat Mag Level in AM   Microcytic Anemia  -Patient's Hb/Hct went from 8.8/30.9 -> 9.2/31.3 ->8.6/29.0 -> 8.1/28.7 -> 8.2/27.9 -Checked Anemia Panel and showed iron  level of 13, U IBC of 4 4, TIBC of 417, saturation ratio of 3%, ferritin level of 28, folate level 18.1, and vitamin B12 level of 616 -Resume Home Ferrous Sulfate 325 mg po Daily  -Continue to Monitor for S/Sx of Bleeding -Repeat CBC in AM  -Hematology also consulted for Thrombocytopenia   ADD/Anxiety/Chornic Pain/?Neuromuscular Disorder -Currently holding Soma, Duloxetine, Gabapentin, Oxycodone, and Quetiapine as well as Venlafaxine and will add back slowly   MS -Diagnosed in 2016 -MRI done and It did not show any enhancement but showed multiple areas of restricted diffusion, not characteristic of strokes in b/l cerebral hemispheres and also in corpus callosum with a broad differential of etiology -Neurology has already evaluated  Right Foot Pain from Right Lisfranc Fx -Check Right Foot X-Ray and showed  -Orthopedic Sugery consulted and they ordered Right Foot CT -Right Foot CT showed Old incompletely healed fractures of the midfoot as described. No significant widening of the Lisfranc joints -States she was recently diagnosed with a Broken foot and was wearing a boot -Pain Control with Tramadol changed to Oxyocodone (Half of home dose) -Orthopedicu Surgery recommend WBAT  DVT prophylaxis: SCDs Code Status: FULL CODE Family Communication: No family present at bedside  Disposition Plan: Anticipate D/C home after improvement in Platelets and clearance by Hematology  Consultants:   Neurology   Hematology  Orthopedic Surgery    Procedures:  EEG Description:  The background activity is slow and poorly organized.  It consists of low voltage, polymorphic delta activity that is continuous and diffusely distributed.  There are frequent periodic discharges of triphasic morphology that are seen bihemispherically, but more common over the left hemisphere.    Hyperventilation and intermittent photic stimulation were not performed.   IMPRESSION: This is an abnormal  electroencephalogram secondary to general background slowing and bihemispheric periodic discharges of triphasic morphology (left greater than right). These are seen most commonly in encephalopathic states, although can not rule out other possibilities such as seizure.  Clinical correlation recommended.         Antimicrobials:  Anti-infectives (From admission, onward)   Start     Dose/Rate Route Frequency Ordered Stop   09/20/18 0600  metroNIDAZOLE (FLAGYL) IVPB 500 mg  Status:  Discontinued     500 mg 100 mL/hr over 60 Minutes Intravenous Every 8 hours 09/20/18 0116 09/22/18 0935   09/20/18 0130  ciprofloxacin (CIPRO) IVPB 400 mg  Status:  Discontinued     400 mg 200 mL/hr over 60 Minutes Intravenous Every 12 hours 09/20/18 0121 09/22/18 0935   09/19/18 2245  doxycycline (VIBRAMYCIN) 100 mg in sodium chloride 0.9 % 250 mL IVPB     100 mg 125 mL/hr over 120 Minutes Intravenous  Once 09/19/18 2239 09/20/18 0300   09/19/18 2115  ciprofloxacin (CIPRO) IVPB 400 mg  Status:  Discontinued     400 mg 200 mL/hr over 60 Minutes Intravenous  Once 09/19/18 2107 09/19/18 2239   09/19/18 2115  metroNIDAZOLE (FLAGYL) IVPB 500 mg  Status:  Discontinued     500 mg 100 mL/hr over 60 Minutes Intravenous  Once 09/19/18 2107 09/19/18  2239     Subjective: Seen and examined at bedside and happy that her platelet count is not dropping significantly further.  No nausea or vomiting.  Denies chest pain, shortness of breath, lightheadedness or dizziness.  Still complains of right foot pain.  No other concerns or complaints at this time and states that after changing to the oxycodone her pain is better controlled.  Objective: Vitals:   09/23/18 2312 09/24/18 0720 09/24/18 0757 09/24/18 1657  BP: 121/79  116/71 115/64  Pulse: 74 66 71 77  Resp: 16     Temp: 98.2 F (36.8 C)  97.9 F (36.6 C) 98.4 F (36.9 C)  TempSrc: Oral  Oral Oral  SpO2: 96% 96% 97% 98%  Weight:      Height:       No intake or output  data in the 24 hours ending 09/24/18 1721 Filed Weights   09/20/18 0345 09/22/18 0602 09/23/18 0614  Weight: 79.5 kg 78.2 kg 78.2 kg   Examination: Physical Exam:  Constitutional: Well-nourished, well-developed overweight Caucasian female currently no acute distress appears calm sitting up in bed Eyes: Lids and conjunctive are normal.  Sclerae anicteric ENMT: External ears nose appear normal.  Grossly normal hearing Neck: Appears supple no JVD Respiratory: Diminished auscultation bilaterally no appreciable wheezing, rales, rhonchi.  Patient was tachypneic or using any accessory muscles to breathe Cardiovascular: Regular rate and rhythm.  No appreciable murmurs, rubs, gallops.  No lower extremity edema Abdomen: Soft, nontender, distended secondary body habitus.  Bowel sounds present GU: Deferred Musculoskeletal: No contractures or cyanosis.  No joint deformities noted but does have some tenderness to palpate in the dorsal aspect of her right foot still Skin: Skin is warm and dry no appreciable rashes or lesions on to skin evaluation does have some lower extremity bruising bilaterally and a bruise on her right hip Neurologic: Cranial nerves II through XII grossly intact no appreciable focal deficits Psychiatric: Normal judgment and insight.  Patient is awake, alert and oriented x3.  Pleasant mood and affect  Data Reviewed: I have personally reviewed following labs and imaging studies  CBC: Recent Labs  Lab 09/20/18 0501 09/21/18 0325 09/22/18 0259 09/23/18 0316 09/24/18 0310  WBC 14.5* 15.6* 14.4* 14.2* 16.6*  NEUTROABS 9.3* 10.5* 8.1* 7.7 11.3*  HGB 8.8* 9.2* 8.6* 8.1* 8.2*  HCT 30.9* 31.3* 29.0* 28.7* 27.9*  MCV 75.9* 74.5* 73.8* 75.1* 74.8*  PLT PLATELET CLUMPS NOTED ON SMEAR, UNABLE TO ESTIMATE 106* 78* 64* 67*   Basic Metabolic Panel: Recent Labs  Lab 09/20/18 0501 09/21/18 0325 09/21/18 0908 09/22/18 0259 09/23/18 0316 09/24/18 0310  NA 139 138  --  137 138 135  K  3.8 3.2*  --  3.6 3.7 4.3  CL 101 105  --  107 108 108  CO2 24 21*  --  22 21* 21*  GLUCOSE 105* 136*  --  102* 150* 235*  BUN 17 13  --  17 15 14   CREATININE 0.75 0.64  --  0.54 0.56 0.52  CALCIUM 8.5* 8.4*  --  8.3* 8.6* 9.3  MG  --   --  1.7 1.6* 1.9 1.8  PHOS  --   --  2.4* 3.3 3.8 4.7*   GFR: Estimated Creatinine Clearance: 82.2 mL/min (by C-G formula based on SCr of 0.52 mg/dL). Liver Function Tests: Recent Labs  Lab 09/20/18 0501 09/21/18 0325 09/22/18 0259 09/23/18 0316 09/24/18 0310  AST 323* 184* 66* 32 22  ALT 564* 462* 290* 195* 146*  ALKPHOS 118 112 91 89 97  BILITOT 1.2 1.1 0.6 0.5 0.6  PROT 6.3* 6.0* 5.7* 5.8* 5.9*  ALBUMIN 3.2* 3.1* 3.1* 3.2* 3.3*   Recent Labs  Lab 09/19/18 1949  LIPASE 17   Recent Labs  Lab 09/19/18 1949  AMMONIA 14   Coagulation Profile: No results for input(s): INR, PROTIME in the last 168 hours. Cardiac Enzymes: No results for input(s): CKTOTAL, CKMB, CKMBINDEX, TROPONINI in the last 168 hours. BNP (last 3 results) No results for input(s): PROBNP in the last 8760 hours. HbA1C: Recent Labs    09/22/18 0259  HGBA1C 6.2*   CBG: Recent Labs  Lab 09/23/18 1620 09/23/18 2110 09/24/18 0757 09/24/18 1149 09/24/18 1655  GLUCAP 164* 286* 133* 296* 282*   Lipid Profile: No results for input(s): CHOL, HDL, LDLCALC, TRIG, CHOLHDL, LDLDIRECT in the last 72 hours. Thyroid Function Tests: No results for input(s): TSH, T4TOTAL, FREET4, T3FREE, THYROIDAB in the last 72 hours. Anemia Panel: Recent Labs    09/22/18 1021  RETICCTPCT 1.1   Sepsis Labs: Recent Labs  Lab 09/19/18 1949 09/19/18 2311  LATICACIDVEN 1.0 0.9    Recent Results (from the past 240 hour(s))  Urine culture     Status: None   Collection Time: 09/19/18 10:25 PM  Result Value Ref Range Status   Specimen Description URINE, RANDOM  Final   Special Requests NONE  Final   Culture   Final    NO GROWTH Performed at [facility], [place] [address], [place], [place] [address]    Report Status 09/21/2018 FINAL  Final  MRSA PCR Screening     Status: None   Collection Time: 09/20/18  2:10 AM  Result Value Ref Range Status   MRSA by PCR NEGATIVE NEGATIVE Final    Comment:        The GeneXpert MRSA Assay (FDA approved for NASAL specimens only), is one component of a comprehensive MRSA colonization surveillance program. It is not intended to diagnose MRSA infection nor to guide or monitor treatment for MRSA infections. Performed at [facility], [place] [address] [place], [place] [address]     Estimated body mass index is 28.69 kg/m as calculated from the following:   Height as of this encounter: 5\' 5"  (1.651 m).   Weight as of this encounter: 78.2 kg.   Radiology Studies: Ct Foot Right Wo Contrast  Result Date: 09/24/2018 CLINICAL DATA:  Foot fracture. EXAM: CT OF THE RIGHT FOOT WITHOUT CONTRAST TECHNIQUE: Multidetector CT imaging of the right foot was performed according to the standard protocol. Multiplanar CT image reconstructions were also generated. COMPARISON:  Radiographs dated 09/23/2018 FINDINGS: Bones/Joint/Cartilage There are old incompletely healed fractures of the medial cuneiform and of the bases of the second and third metatarsals. There is slight widening of the joint space between the base of the second metatarsal and the medial cuneiform but at least some components of the Lisfranc ligament are intact and this slight widening is not felt to be significant. There are no acute fractures. Ligaments Suboptimally assessed by CT. No abnormal widening of the Lisfranc joints. Muscles and Tendons Negative. Soft tissues Negative. IMPRESSION: Old incompletely healed fractures of the midfoot as described. No significant widening of the Lisfranc joints. Electronically Signed   By: Lorriane Shire M.D.   On: 09/24/2018 11:34   Dg Foot 2 Views Right  Result Date: 09/23/2018 CLINICAL DATA:  Foot pain.  History of prior injury in  January. EXAM: RIGHT FOOT -  2 VIEW COMPARISON:  None. FINDINGS: There is a fracture involving the base of the second metatarsal medially. There is also widening of the joint space between the middle and medial cuneiforms and slight lateral subluxation of the first metatarsal base in relation to the medial cuneiform. Findings highly suspicious for Lisfranc ligament tear/rupture. There could be other fractures that are not well demonstrated on the plain films. Mild pes cavus.  Small calcaneal heel spur. IMPRESSION: Plain film findings suspicious for a Lisfranc ligament disruption and fracture at the base of the second metatarsal. No other definite fractures are identified but CT may be helpful for further evaluation. Electronically Signed   By: Marijo Sanes M.D.   On: 09/23/2018 13:29   Scheduled Meds:  budesonide (PULMICORT) nebulizer solution  0.25 mg Nebulization BID   calcium carbonate  1 tablet Oral BID WC   cholecalciferol  1,000 Units Oral Daily   ferrous sulfate  325 mg Oral Daily   insulin aspart  0-5 Units Subcutaneous QHS   insulin aspart  0-9 Units Subcutaneous TID WC   insulin detemir  10 Units Subcutaneous BID   ipratropium-albuterol  3 mL Nebulization BID   lacosamide  100 mg Oral BID   pantoprazole  40 mg Oral Daily   predniSONE  60 mg Oral Q breakfast   vitamin B-12  1,000 mcg Oral Daily   Continuous Infusions:   LOS: 4 days   Kerney Elbe, DO [facility] PAGER is on AMION  If 7PM-7AM, please contact night-coverage [email or web address] Password [facility] 09/24/2018, 5:21 PM

## 2018-09-25 ENCOUNTER — Telehealth: Payer: Self-pay

## 2018-09-25 LAB — CBC WITH DIFFERENTIAL/PLATELET
BAND NEUTROPHILS: 0 %
BASOS ABS: 0 10*3/uL (ref 0.0–0.1)
Basophils Relative: 0 %
Blasts: 0 %
EOS PCT: 1 %
Eosinophils Absolute: 0.2 10*3/uL (ref 0.0–0.5)
HCT: 25.2 % — ABNORMAL LOW (ref 36.0–46.0)
Hemoglobin: 7.4 g/dL — ABNORMAL LOW (ref 12.0–15.0)
Lymphocytes Relative: 18 %
Lymphs Abs: 3.7 10*3/uL (ref 0.7–4.0)
MCH: 21.8 pg — ABNORMAL LOW (ref 26.0–34.0)
MCHC: 29.4 g/dL — ABNORMAL LOW (ref 30.0–36.0)
MCV: 74.3 fL — ABNORMAL LOW (ref 80.0–100.0)
Metamyelocytes Relative: 0 %
Monocytes Absolute: 2.2 10*3/uL — ABNORMAL HIGH (ref 0.1–1.0)
Monocytes Relative: 11 %
Myelocytes: 0 %
Neutro Abs: 14.2 10*3/uL — ABNORMAL HIGH (ref 1.7–7.7)
Neutrophils Relative %: 70 %
Other: 0 %
Platelets: 75 10*3/uL — ABNORMAL LOW (ref 150–400)
Promyelocytes Relative: 0 %
RBC: 3.39 MIL/uL — ABNORMAL LOW (ref 3.87–5.11)
RDW: 25.7 % — ABNORMAL HIGH (ref 11.5–15.5)
WBC: 20.3 10*3/uL — ABNORMAL HIGH (ref 4.0–10.5)
nRBC: 0 /100 WBC
nRBC: 1 % — ABNORMAL HIGH (ref 0.0–0.2)

## 2018-09-25 LAB — COMPREHENSIVE METABOLIC PANEL
ALT: 103 U/L — AB (ref 0–44)
AST: 19 U/L (ref 15–41)
Albumin: 3.2 g/dL — ABNORMAL LOW (ref 3.5–5.0)
Alkaline Phosphatase: 94 U/L (ref 38–126)
Anion gap: 6 (ref 5–15)
BUN: 26 mg/dL — ABNORMAL HIGH (ref 6–20)
CO2: 20 mmol/L — ABNORMAL LOW (ref 22–32)
Calcium: 8.4 mg/dL — ABNORMAL LOW (ref 8.9–10.3)
Chloride: 110 mmol/L (ref 98–111)
Creatinine, Ser: 0.72 mg/dL (ref 0.44–1.00)
Glucose, Bld: 252 mg/dL — ABNORMAL HIGH (ref 70–99)
Potassium: 3.8 mmol/L (ref 3.5–5.1)
Sodium: 136 mmol/L (ref 135–145)
Total Bilirubin: 0.4 mg/dL (ref 0.3–1.2)
Total Protein: 5.7 g/dL — ABNORMAL LOW (ref 6.5–8.1)

## 2018-09-25 LAB — PHOSPHORUS: PHOSPHORUS: 4.6 mg/dL (ref 2.5–4.6)

## 2018-09-25 LAB — GLUCOSE, CAPILLARY
GLUCOSE-CAPILLARY: 425 mg/dL — AB (ref 70–99)
Glucose-Capillary: 186 mg/dL — ABNORMAL HIGH (ref 70–99)
Glucose-Capillary: 196 mg/dL — ABNORMAL HIGH (ref 70–99)
Glucose-Capillary: 142 mg/dL — ABNORMAL HIGH (ref 70–99)

## 2018-09-25 LAB — PREPARE RBC (CROSSMATCH)

## 2018-09-25 LAB — MAGNESIUM: Magnesium: 1.8 mg/dL (ref 1.7–2.4)

## 2018-09-25 MED ORDER — SODIUM CHLORIDE 0.9 % IV SOLN
510.0000 mg | Freq: Once | INTRAVENOUS | Status: AC
  Administered 2018-09-25: 510 mg via INTRAVENOUS
  Filled 2018-09-25: qty 17

## 2018-09-25 MED ORDER — INSULIN ASPART 100 UNIT/ML ~~LOC~~ SOLN
5.0000 [IU] | Freq: Three times a day (TID) | SUBCUTANEOUS | Status: DC
  Administered 2018-09-25 – 2018-09-26 (×4): 5 [IU] via SUBCUTANEOUS

## 2018-09-25 MED ORDER — DIPHENHYDRAMINE HCL 50 MG/ML IJ SOLN
12.5000 mg | Freq: Three times a day (TID) | INTRAMUSCULAR | Status: DC | PRN
  Administered 2018-09-25: 12.5 mg via INTRAVENOUS
  Filled 2018-09-25: qty 1

## 2018-09-25 MED ORDER — SODIUM CHLORIDE 0.9% IV SOLUTION: Freq: Once | INTRAVENOUS | Status: DC

## 2018-09-25 MED ORDER — INSULIN ASPART 100 UNIT/ML ~~LOC~~ SOLN
0.0000 [IU] | Freq: Three times a day (TID) | SUBCUTANEOUS | Status: DC
  Administered 2018-09-25: 15 [IU] via SUBCUTANEOUS
  Administered 2018-09-26: 2 [IU] via SUBCUTANEOUS

## 2018-09-25 NOTE — Telephone Encounter (Signed)
TC to pt per Dr Walden Field to let her know that her follow up appointment is scheduled for 10/02/18 @9 :30. Pt aware of appointment date and time. No further problems or concerns at this time.

## 2018-09-25 NOTE — Progress Notes (Signed)
Spoke with blood bank and per Dr. Saralyn Pilar the patient does not meet criteria for RBC transfusion at this time. Blood bank explained because the patient is currently not requiring oxygen and is asymptomatic then a transfusion is not indicated. Primary and blood bank to be notified if patient begins to become symptomatic. Discussed with Dr. Alfredia Ferguson and patient. Will continue to monitor.

## 2018-09-25 NOTE — Progress Notes (Signed)
   09/25/18 1200  Clinical Encounter Type  Visited With Other (Comment)  Visit Type Initial  Referral From Nurse  Consult/Referral To Chaplain  Spiritual Encounters  Spiritual Needs Other (Comment)   Responded to spiritual care consult. Called Nurse Lyndee Leo) and requested more information about the consult. Nurse stated that PT did not have a copy and Nurse agreed that she will give PT copy of the AD. If there are other questions concerning completion of the AD to contact spiritual care department for another consult.  Chaplain Fidel Levy [phone]

## 2018-09-25 NOTE — Progress Notes (Addendum)
Inpatient Diabetes Program Recommendations  AACE/ADA: New Consensus Statement on Inpatient Glycemic Control (2015)  Target Ranges:  Prepandial:   less than 140 mg/dL      Peak postprandial:   less than 180 mg/dL (1-2 hours)      Critically ill patients:  140 - 180 mg/dL   Results for Amber Faulkner, Amber Faulkner (MRN [id number]) as of 09/25/2018 08:33  Ref. Range 09/24/2018 07:57 09/24/2018 11:49 09/24/2018 16:55 09/24/2018 21:20  Glucose-Capillary Latest Ref Range: 70 - 99 mg/dL 133 (H)  1 unit NOVOLOG +  10 units LEVEMIR given at 8am  296 (H)  5 units NOVOLOG  282 (H)  5 units NOVOLOG  143 (H)     10 units LEVEMIR given at 11pm    Home DM Meds: Levemir 20 units BID       Humalog 15 units TID  Current Orders: Levemir 10 units BID       Novolog Sensitive Correction Scale/ SSI (0-9 units) TID AC + HS       Getting Prednisone 60 mg Daily.  Post Meal CBGs are elevated likely due to the effects of the Prednisone.   MD- Please consider the following in-hospital insulin adjustments:  1. Start Novolog Meal Coverage: Novolog 5 units TID with meals (1/3 total home dose)  2. Change PO diet to Carbohydrate Modified (currently ordered as Regular diet without Carb restrictions)     --Will follow patient during hospitalization--  Wyn Quaker RN, MSN, CDE Diabetes Coordinator Inpatient Glycemic Control Team Team Pager: [phone] (8a-5p)

## 2018-09-25 NOTE — Care Management Important Message (Signed)
Important Message  Patient Details  Name: Amber Faulkner MRN: [id number] Date of Birth: 01-16-1961   Medicare Important Message Given:  Yes    Amber Faulkner 09/25/2018, 3:17 PM

## 2018-09-25 NOTE — Progress Notes (Signed)
PROGRESS NOTE    Amber Faulkner  BZJ:[id number] DOB: 05-10-1961 DOA: 09/19/2018 PCP: Hali Marry, MD   Brief Narrative:  The patient is a 58 year old female brought from home, found on the floor.  She is unable to give any history.  She was in the hospital in the past, chart review shows that she has history of seizure, hypertension, type 2 diabetes, questionable history of HIV, so far negative HIV test on our records from 4 years ago.On arrival to the emergency room, she was with significant aphasia.  Previous records also indicate that she is on chronic pain medications and anxiety medications.  Patient was just repeating sentences.  There was no focal deficit.  She was found with mild LFT elevation.  CT scan of the abdomen did not show any significant findings.  Chest x-ray with suspected pneumonia.  MRI showed bilateral nonspecific changes  **Interim History Patient underwent EEG Testing and then suddenty woke up and was alert and oriented. Neurology evaluated and signed off case. Platelet Count started dropping and she has a Hx of ITP so case was discussed with Hematology and recommended repeating Platelet Count on Citrate in AM and watching for bleeding. Because platelets dropped further Hematology was formally consulted. Patient was also complaining of Right Foot pain and states that she "broke my foot recently" so will order a foot X-Ray. X-Ray Confirmed Right Lisfranc injury that was not fully healed and Orthopedic surgery recommended WBAT. Platelet Count is a little improved and stable and discussed the case with Medical Oncology who recommends observing the patient one more day and continuing Prednisone 60 mg for at least 10 days and transfusing IV Iron and 1 unit of pRBC's today.   Assessment & Plan:   Principal Problem:   Acute encephalopathy Active Problems:   Essential hypertension   Uncontrolled type 2 diabetes mellitus with diabetic polyneuropathy, with long-term current  use of insulin (HCC)   Abdominal pain   Elevated LFTs   Encephalopathy acute  Acute Metabolic Encephalopathy, improved  -Cause unknown.   -Acute infection versus polypharmacy or possibility of seizure and postictal state. Continue monitoring.   -Neurochecks every 4 hours.   -Patient is now awake enough, will allow her to eat under supervision. -EEG as below.   -Changed to IV seizure medications by neurology for better absorption and switched back to po. -Suspected polypharmacy, discontinuing all sedating medications. -Treating as presumptive pneumonia and is was Cipro/Flagyl which I stopped -Repeat CXR in AM showed Unchanged appearance of the chest with streaky LEFT basilar opacities again noted. -TSH was 2.67 -Check RPR, B12 (616), B1 Levels  -Checked UDS and was Negative  -MRI showed "Ill-defined white matter lesions with reduced diffusion are present within the left-greater-than-right inferior frontal gyri as well as the splenium of corpus callosum. The pattern is atypical for ischemic infarction. There is a broad differential, for example transit metabolic derangement, demyelination, seizure, drug toxicity (antiepileptic, metronidazole, sympathomimetic.), ETOH encephalopathy, or hypoglycemia.Ther was also Background of nonspecific white matter hyperintensities may represent chronic sequelae of the acute process described above or age advanced chronic microvascular ischemic changes. -Patient is now A and O x4  -Neurology signed off and continue to Monitor her Neurological Status   Left Lower Pneumonia COPD -Admission CXR showed "Regressed but not fully resolved mild left lung base opacity since February. No new cardiopulmonary abnormality." -Repeat CXR 09/22/2018 showed "Unchanged appearance of the chest with streaky LEFT basilar opacities again noted." -Presented with leukocytosis and cough.   -She  is a smoker with COPD.  Was treated as Pneumonia but Abx have now stoppedd .   -There  was concern about intra-abdominal infection, she was on Ciprofloxacin and Flagyl which is now D/C'd -Repeat CXR intermittently  -C/w Budesonide 0.25 mg Neb BID and DuoNeb q6h and q2hprn Wheezing  -Currently her Advair Diskus, Spirivia, and Albuterol Inhalers are bing held but will resume at D/C   Abnormal LFTs, improving -Cause unknown.   -CT scan of the abdomen was fairly normal.   -She had no evidence of biliary obstruction.  No cholecystitis.  -On repeat chemistry panel, bilirubin is normal.   -Patient have might have transient transaminase elevation from shock or hypotension. -LFTs are now trending down and AST went from 407 -> 19 and ALT went from 593 -> 103 -T Bili was 1.1 and improved to 0.4 -Hepatitis panel showed HCV Ab >11.0 so will check Genotype and RNA Quant; HCV Quantitative Not Detected and Genotype was "RTNI" -Continue to Hold Home Statin for now until LFTs are improved -Continue to Monitor and Trend LFTs  Leukocytosis -Worsening. WBC went from 13.8 -> 14.5 -> 15.6 -> 14.4 -> 14.2 -> 16.6 (In the setting of Steroid Demargination) and is now worse at 20.3 -Unclear Etiology initially  -Abx with Cipro/Flagyl now D/C'd  -Continue to Monitor for S/Sx of Infection -Urinalysis showed Large Hgb, 80 Ketones and >50 RBC/HPF and 6-10 WBC and Negative Leukocytes -Urine Cx showed No Growth -MRSA PCR was Negative -Check Procalcitonin and LA in AM  -Continue to Monitor and Repeat CBC in AM  Thrombocytopenia in the Setting of Prior Chronic ITP -Patient's Platelet Count dropped from 150 -> 106 -> 78 -> 64 -> 67 -> 75 -Patient has had a Splenectomy and two IVIG's previously -Obtained LDH (199) and Reticulocyte Count and Smear -Discussed the case with Oncology Dr. Walden Field who recommended Stopping Enoxaparin (Not on her [facility]) and other medications that can induce Thrombocytopenia and repeating Platelet Count in Citrate in AM -She recommends to continue to Monitor for S/Sx of Bleeding -Has  had Treatment of Steroids (Dexamethasone) with IVIG x2 along with Rituximab  -Patient does have bruising on Right Hip and Legs -Since Platelet Count dropped further will consult Hematology and Dr. Walden Field to see the patient and she recommends daily Prednisone at 60 mg for at least 10 days  Diabetes Mellitus Type 2 -CBG's ranging from 143-296 -Change Sensitive Novolog SSI q4h to AC/HS -Resume Home Insulin Detemir but at a reduced dose of 10 units BID -Add NovoLog 5 units 3 times daily with meals given that she is on Prednisone now  -Checked HbA1c and was 6.2  Essential Hypertension -Blood pressures are fairly normal and her Losartan 25 mg po Daily is being  Polypharmacy -Holding all reported medications that are causing sedation including Carisoprodol, Gabapentin, Duloxetine, Oxycodone, Quetiapine and Venlafaxine XR  Seizure Disorder -EEG done and Impression was "This is an abnormal electroencephalogram secondary to general background slowing and bihemispheric periodic discharges of triphasic morphology (left greater than right). These are seen most commonly in encephalopathic states, although can not rule out other possibilities such as seizure" -Neurology consulted and recommending continuing Vimpat at her Home dose as well as continuing and restarting Topiromate -Currently was on 100 mg q12h of Lacosomide and will change to po  -Upon further MAR review there is no Topiromate listed on it -Neurology stating outpatient Follow up for her Epilepsy as well as MRI in 3 months   History of ?HIV -There are many  places in the chart that says patient has history of HIV.   -HIV test in 2016 was negative.   -Will repeat before proceeding to check CD4 counts and repeat this Admission was Non-Reactive   Hypokalemia -Patient's K+ was 3.2 and repeat this AM was 3.8 -Continue to Monitor and Replete as Necessary -Repeat CMP in AM  Hypophosphatemia -> Hyperphosphatemia  -Patient's Phos Level was  2.4 and is now 4.6 -Continue to Monitor and Replete as Necessary -Repeat Phos Level in AM   Hypomagnesemia  -Patient's Mag Level was 1.8 -Continue to Monitor and Replete as Necessary  -Repeat Mag Level in AM   Microcytic Anemia  -Patient's Hb/Hct went from 8.8/30.9 -> 9.2/31.3 ->8.6/29.0 -> 8.1/28.7 -> 8.2/27.9 -> 7.4/25.2 -Checked Anemia Panel and showed iron level of 13, U IBC of 4 4, TIBC of 417, saturation ratio of 3%, ferritin level of 28, folate level 18.1, and vitamin B12 level of 616 -Resume Home Ferrous Sulfate 325 mg po Daily; Oncology recommending Transfusing with Feraheme and giving 1 unit of pRBC -Continue to Monitor for S/Sx of Bleeding -Repeat CBC in AM and likley can D/C home in AM  -Hematology also consulted for Thrombocytopenia   ADD/Anxiety/Chornic Pain/?Neuromuscular Disorder -Currently holding Soma, Duloxetine, Gabapentin, Oxycodone, and Quetiapine as well as Venlafaxine and will add back slowly   MS -Diagnosed in 2016 -MRI done and It did not show any enhancement but showed multiple areas of restricted diffusion, not characteristic of strokes in b/l cerebral hemispheres and also in corpus callosum with a broad differential of etiology -Neurology has already evaluated  Right Foot Pain from Right Lisfranc Fx -Check Right Foot X-Ray and showed  -Orthopedic Sugery consulted and they ordered Right Foot CT -Right Foot CT showed Old incompletely healed fractures of the midfoot as described. No significant widening of the Lisfranc joints -States she was recently diagnosed with a Broken foot and was wearing a boot -Pain Control with Tramadol changed to Oxyocodone (Half of home dose) -Orthopedicu Surgery recommend WBAT -Brought her boot from home and was wearing it.   DVT prophylaxis: SCDs Code Status: FULL CODE Family Communication: No family present at bedside  Disposition Plan: Anticipate D/C home in AM improvement in Platelets as Hematology recommends observing  today.   Consultants:   Neurology   Hematology  Orthopedic Surgery    Procedures:  EEG Description:  The background activity is slow and poorly organized.  It consists of low voltage, polymorphic delta activity that is continuous and diffusely distributed.  There are frequent periodic discharges of triphasic morphology that are seen bihemispherically, but more common over the left hemisphere.    Hyperventilation and intermittent photic stimulation were not performed.   IMPRESSION: This is an abnormal electroencephalogram secondary to general background slowing and bihemispheric periodic discharges of triphasic morphology (left greater than right). These are seen most commonly in encephalopathic states, although can not rule out other possibilities such as seizure.  Clinical correlation recommended.         Antimicrobials:  Anti-infectives (From admission, onward)   Start     Dose/Rate Route Frequency Ordered Stop   09/20/18 0600  metroNIDAZOLE (FLAGYL) IVPB 500 mg  Status:  Discontinued     500 mg 100 mL/hr over 60 Minutes Intravenous Every 8 hours 09/20/18 0116 09/22/18 0935   09/20/18 0130  ciprofloxacin (CIPRO) IVPB 400 mg  Status:  Discontinued     400 mg 200 mL/hr over 60 Minutes Intravenous Every 12 hours 09/20/18 0121 09/22/18 0935  09/19/18 2245  doxycycline (VIBRAMYCIN) 100 mg in sodium chloride 0.9 % 250 mL IVPB     100 mg 125 mL/hr over 120 Minutes Intravenous  Once 09/19/18 2239 09/20/18 0300   09/19/18 2115  ciprofloxacin (CIPRO) IVPB 400 mg  Status:  Discontinued     400 mg 200 mL/hr over 60 Minutes Intravenous  Once 09/19/18 2107 09/19/18 2239   09/19/18 2115  metroNIDAZOLE (FLAGYL) IVPB 500 mg  Status:  Discontinued     500 mg 100 mL/hr over 60 Minutes Intravenous  Once 09/19/18 2107 09/19/18 2239     Subjective: Seen and examined at bedside no complaints currently.  Was wearing the boot for her right foot.  No nausea or vomiting.  States that shortness  breath is stable.  No lightheadedness or dizziness but turned about her blood count dropping.  No other concerns or plans at this time.  Objective: Vitals:   09/24/18 1954 09/24/18 1955 09/24/18 2310 09/25/18 0739  BP:   (!) 144/87   Pulse:   86 79  Resp:   14   Temp:   98.5 F (36.9 C)   TempSrc:   Oral   SpO2: 99% 99% 94% 95%  Weight:      Height:        Intake/Output Summary (Last 24 hours) at 09/25/2018 1233 Last data filed at 09/25/2018 0800 Gross per 24 hour  Intake 720 ml  Output -  Net 720 ml   Filed Weights   09/20/18 0345 09/22/18 0602 09/23/18 0614  Weight: 79.5 kg 78.2 kg 78.2 kg   Examination: Physical Exam:  Constitutional: Well-nourished, well-developed overweight Caucasian female currently no acute distress appears calm and comfortable today. Eyes: Conjunctive are normal.  Sclera anicteric ENMT: External ears and nose appear normal.  Grossly normal hearing Neck: Appears supple no JVD Respiratory: Diminished to auscultation bilaterally no appreciable wheezing, rales, rhonchi.  Patient not tachypneic or using accessory muscles of breathing Cardiovascular: Regular rate and rhythm.  No appreciable murmurs, rubs, gallops.  No extremity edema noted Abdomen: Soft, nontender, distended secondary body habitus.  Bowel sounds present GU: Deferred Musculoskeletal: Contractures cyanosis.  Right foot is in a boot. Skin: Skin is warm and dry.  No appreciable rashes or lesions but does have some lower extremity bruising bilaterally and a bruise on her right hip.  Right foot is in a boot Neurologic: Cranial nerves II through XII grossly intact no facial focal deficits Psychiatric: Intact judgment and insight.  Patient is awake, alert and oriented x3.  Pleasant mood and affect  Data Reviewed: I have personally reviewed following labs and imaging studies  CBC: Recent Labs  Lab 09/21/18 0325 09/22/18 0259 09/23/18 0316 09/24/18 0310 09/25/18 0208  WBC 15.6* 14.4* 14.2*  16.6* 20.3*  NEUTROABS 10.5* 8.1* 7.7 11.3* 14.2*  HGB 9.2* 8.6* 8.1* 8.2* 7.4*  HCT 31.3* 29.0* 28.7* 27.9* 25.2*  MCV 74.5* 73.8* 75.1* 74.8* 74.3*  PLT 106* 78* 64* 67* 75*   Basic Metabolic Panel: Recent Labs  Lab 09/21/18 0325 09/21/18 0908 09/22/18 0259 09/23/18 0316 09/24/18 0310 09/25/18 0208  NA 138  --  137 138 135 136  K 3.2*  --  3.6 3.7 4.3 3.8  CL 105  --  107 108 108 110  CO2 21*  --  22 21* 21* 20*  GLUCOSE 136*  --  102* 150* 235* 252*  BUN 13  --  17 15 14  26*  CREATININE 0.64  --  0.54 0.56 0.52 0.72  CALCIUM 8.4*  --  8.3* 8.6* 9.3 8.4*  MG  --  1.7 1.6* 1.9 1.8 1.8  PHOS  --  2.4* 3.3 3.8 4.7* 4.6   GFR: Estimated Creatinine Clearance: 82.2 mL/min (by C-G formula based on SCr of 0.72 mg/dL). Liver Function Tests: Recent Labs  Lab 09/21/18 0325 09/22/18 0259 09/23/18 0316 09/24/18 0310 09/25/18 0208  AST 184* 66* 32 22 19  ALT 462* 290* 195* 146* 103*  ALKPHOS 112 91 89 97 94  BILITOT 1.1 0.6 0.5 0.6 0.4  PROT 6.0* 5.7* 5.8* 5.9* 5.7*  ALBUMIN 3.1* 3.1* 3.2* 3.3* 3.2*   Recent Labs  Lab 09/19/18 1949  LIPASE 17   Recent Labs  Lab 09/19/18 1949  AMMONIA 14   Coagulation Profile: No results for input(s): INR, PROTIME in the last 168 hours. Cardiac Enzymes: No results for input(s): CKTOTAL, CKMB, CKMBINDEX, TROPONINI in the last 168 hours. BNP (last 3 results) No results for input(s): PROBNP in the last 8760 hours. HbA1C: No results for input(s): HGBA1C in the last 72 hours. CBG: Recent Labs  Lab 09/24/18 1149 09/24/18 1655 09/24/18 2120 09/25/18 0852 09/25/18 1057  GLUCAP 296* 282* 143* 186* 196*   Lipid Profile: No results for input(s): CHOL, HDL, LDLCALC, TRIG, CHOLHDL, LDLDIRECT in the last 72 hours. Thyroid Function Tests: No results for input(s): TSH, T4TOTAL, FREET4, T3FREE, THYROIDAB in the last 72 hours. Anemia Panel: No results for input(s): VITAMINB12, FOLATE, FERRITIN, TIBC, IRON, RETICCTPCT in the last 72  hours. Sepsis Labs: Recent Labs  Lab 09/19/18 1949 09/19/18 2311  LATICACIDVEN 1.0 0.9    Recent Results (from the past 240 hour(s))  Urine culture     Status: None   Collection Time: 09/19/18 10:25 PM  Result Value Ref Range Status   Specimen Description URINE, RANDOM  Final   Special Requests NONE  Final   Culture   Final    NO GROWTH Performed at [facility], [place] [address], [place], [place] [address]    Report Status 09/21/2018 FINAL  Final  MRSA PCR Screening     Status: None   Collection Time: 09/20/18  2:10 AM  Result Value Ref Range Status   MRSA by PCR NEGATIVE NEGATIVE Final    Comment:        The GeneXpert MRSA Assay (FDA approved for NASAL specimens only), is one component of a comprehensive MRSA colonization surveillance program. It is not intended to diagnose MRSA infection nor to guide or monitor treatment for MRSA infections. Performed at [facility], [place] [address] [place], [place] [address]     Estimated body mass index is 28.69 kg/m as calculated from the following:   Height as of this encounter: 5\' 5"  (1.651 m).   Weight as of this encounter: 78.2 kg.   Radiology Studies: Ct Foot Right Wo Contrast  Result Date: 09/24/2018 CLINICAL DATA:  Foot fracture. EXAM: CT OF THE RIGHT FOOT WITHOUT CONTRAST TECHNIQUE: Multidetector CT imaging of the right foot was performed according to the standard protocol. Multiplanar CT image reconstructions were also generated. COMPARISON:  Radiographs dated 09/23/2018 FINDINGS: Bones/Joint/Cartilage There are old incompletely healed fractures of the medial cuneiform and of the bases of the second and third metatarsals. There is slight widening of the joint space between the base of the second metatarsal and the medial cuneiform but at least some components of the Lisfranc ligament are intact and this slight widening is not felt to be significant. There are no acute  fractures. Ligaments Suboptimally assessed by  CT. No abnormal widening of the Lisfranc joints. Muscles and Tendons Negative. Soft tissues Negative. IMPRESSION: Old incompletely healed fractures of the midfoot as described. No significant widening of the Lisfranc joints. Electronically Signed   By: Lorriane Shire M.D.   On: 09/24/2018 11:34   Scheduled Meds: . sodium chloride   Intravenous Once  . budesonide (PULMICORT) nebulizer solution  0.25 mg Nebulization BID  . calcium carbonate  1 tablet Oral BID WC  . cholecalciferol  1,000 Units Oral Daily  . ferrous sulfate  325 mg Oral Daily  . insulin aspart  0-5 Units Subcutaneous QHS  . insulin aspart  0-9 Units Subcutaneous TID WC  . insulin aspart  5 Units Subcutaneous TID WC  . insulin detemir  10 Units Subcutaneous BID  . lacosamide  100 mg Oral BID  . pantoprazole  40 mg Oral Daily  . predniSONE  60 mg Oral Q breakfast  . vitamin B-12  1,000 mcg Oral Daily   Continuous Infusions:   LOS: 5 days   Kerney Elbe, DO [facility] PAGER is on AMION  If 7PM-7AM, please contact night-coverage [email or web address] Password [facility] 09/25/2018, 12:33 PM

## 2018-09-26 LAB — CBC WITH DIFFERENTIAL/PLATELET
Abs Immature Granulocytes: 0.15 10*3/uL — ABNORMAL HIGH (ref 0.00–0.07)
BASOS PCT: 0 %
Basophils Absolute: 0.1 10*3/uL (ref 0.0–0.1)
Eosinophils Absolute: 0 10*3/uL (ref 0.0–0.5)
Eosinophils Relative: 0 %
HCT: 26.9 % — ABNORMAL LOW (ref 36.0–46.0)
Hemoglobin: 7.4 g/dL — ABNORMAL LOW (ref 12.0–15.0)
Immature Granulocytes: 1 %
Lymphocytes Relative: 16 %
Lymphs Abs: 3.1 10*3/uL (ref 0.7–4.0)
MCH: 21.1 pg — AB (ref 26.0–34.0)
MCHC: 27.5 g/dL — ABNORMAL LOW (ref 30.0–36.0)
MCV: 76.6 fL — AB (ref 80.0–100.0)
Monocytes Absolute: 2.1 10*3/uL — ABNORMAL HIGH (ref 0.1–1.0)
Monocytes Relative: 11 %
Neutro Abs: 13.7 10*3/uL — ABNORMAL HIGH (ref 1.7–7.7)
Neutrophils Relative %: 72 %
PLATELETS: 89 10*3/uL — AB (ref 150–400)
RBC: 3.51 MIL/uL — ABNORMAL LOW (ref 3.87–5.11)
RDW: 25.7 % — ABNORMAL HIGH (ref 11.5–15.5)
WBC: 19.2 10*3/uL — ABNORMAL HIGH (ref 4.0–10.5)
nRBC: 0.4 % — ABNORMAL HIGH (ref 0.0–0.2)

## 2018-09-26 LAB — COMPREHENSIVE METABOLIC PANEL
ALT: 78 U/L — ABNORMAL HIGH (ref 0–44)
AST: 20 U/L (ref 15–41)
Albumin: 3.3 g/dL — ABNORMAL LOW (ref 3.5–5.0)
Alkaline Phosphatase: 95 U/L (ref 38–126)
Anion gap: 5 (ref 5–15)
BUN: 26 mg/dL — ABNORMAL HIGH (ref 6–20)
CO2: 20 mmol/L — AB (ref 22–32)
Calcium: 8.6 mg/dL — ABNORMAL LOW (ref 8.9–10.3)
Chloride: 114 mmol/L — ABNORMAL HIGH (ref 98–111)
Creatinine, Ser: 0.65 mg/dL (ref 0.44–1.00)
GFR calc Af Amer: >60 mL/min (ref >60)
GFR calc non Af Amer: >60 mL/min (ref >60)
Glucose, Bld: 121 mg/dL — ABNORMAL HIGH (ref 70–99)
Potassium: 3.9 mmol/L (ref 3.5–5.1)
SODIUM: 139 mmol/L (ref 135–145)
Total Bilirubin: 0.7 mg/dL (ref 0.3–1.2)
Total Protein: 5.8 g/dL — ABNORMAL LOW (ref 6.5–8.1)

## 2018-09-26 LAB — TYPE AND SCREEN
ABO/RH(D): O POS
Antibody Screen: POSITIVE
DAT, IgG: POSITIVE

## 2018-09-26 LAB — PHOSPHORUS: Phosphorus: 3.5 mg/dL (ref 2.5–4.6)

## 2018-09-26 LAB — MAGNESIUM: Magnesium: 1.9 mg/dL (ref 1.7–2.4)

## 2018-09-26 LAB — GLUCOSE, CAPILLARY
Glucose-Capillary: 98 mg/dL (ref 70–99)
Glucose-Capillary: 145 mg/dL — ABNORMAL HIGH (ref 70–99)

## 2018-09-26 MED ORDER — DIPHENHYDRAMINE HCL 12.5 MG/5ML PO ELIX: 12.5000 mg | ORAL_SOLUTION | Freq: Three times a day (TID) | ORAL | Status: DC | PRN

## 2018-09-26 MED ORDER — PROMETHAZINE HCL 12.5 MG PO TABS: 12.5000 mg | ORAL_TABLET | Freq: Four times a day (QID) | ORAL | 0 refills | Status: DC | PRN

## 2018-09-26 MED ORDER — PREDNISONE 20 MG PO TABS: 60.0000 mg | ORAL_TABLET | Freq: Every day | ORAL | 0 refills | Status: DC

## 2018-09-26 MED FILL — PROMETHAZINE 12.5 MG TABLET: 12.5 | 8 days supply | Qty: 30 | Fill #0

## 2018-09-26 MED FILL — predniSONE 20 MG TABS: 20 | 10 days supply | Qty: 30 | Fill #0

## 2018-09-26 NOTE — Progress Notes (Signed)
PHARMACIST - PHYSICIAN COMMUNICATION  DR:   Wynelle Cleveland  CONCERNING: IV to Oral Route Change Policy  RECOMMENDATION: This patient is receiving Benadryl by the intravenous route.  Based on criteria approved by the Pharmacy and Therapeutics Committee, the intravenous medication(s) is/are being converted to the equivalent oral dose form(s).   DESCRIPTION: These criteria include:  The patient is eating (either orally or via tube) and/or has been taking other orally administered medications for a least 24 hours  The patient has no evidence of active gastrointestinal bleeding or impaired GI absorption (gastrectomy, short bowel, patient on TNA or NPO).  If you have questions about this conversion, please contact the Pharmacy Department  []   [phone] )  [facility] []   [phone] )  [facility] [x]   [phone] )  [facility] []   [phone] )  [facility] []   [phone] )  [place], [place] 09/26/2018 10:00 AM

## 2018-09-26 NOTE — Discharge Instructions (Signed)
Check your sugars and BP at least 2 x a day while on Prednisone.    You were cared for by a hospitalist during your hospital stay. If you have any questions about your discharge medications or the care you received while you were in the hospital after you are discharged, you can call the unit and asked to speak with the hospitalist on call if the hospitalist that took care of you is not available. Once you are discharged, your primary care physician will handle any further medical issues.   Please note that NO REFILLS for any discharge medications will be authorized once you are discharged, as it is imperative that you return to your primary care physician (or establish a relationship with a primary care physician if you do not have one) for your aftercare needs so that they can reassess your need for medications and monitor your lab values.  Please take all your medications with you for your next visit with your Primary MD. Please ask your Primary MD to get all Hospital records sent to his/her office. Please request your Primary MD to go over all hospital test results at the follow up.   If you experience worsening of your admission symptoms, develop shortness of breath, chest pain, suicidal or homicidal thoughts or a life threatening emergency, you must seek medical attention immediately by calling 911 or calling your MD.   Dennis Bast must read the complete instructions/literature along with all the possible adverse reactions/side effects for all the medicines you take including new medications that have been prescribed to you. Take new medicines after you have completely understood and accpet all the possible adverse reactions/side effects.    Do not drive when taking pain medications or sedatives.     Do not take more than prescribed Pain, Sleep and Anxiety Medications   If you have smoked or chewed Tobacco in the last 2 yrs please stop. Stop any regular alcohol  and or recreational drug use.     Wear Seat belts while driving.

## 2018-09-26 NOTE — Discharge Summary (Signed)
Physician Discharge Summary  Amber Faulkner SPQ:[id number] DOB: [birth year] DOA: 09/19/2018  PCP: Amber Marry, MD  Admit date: 09/19/2018 Discharge date: 09/26/2018  Admitted From: home  Disposition:  home   Recommendations for Outpatient Follow-up:  1. F/u LFTs please  Home Health:  none  Equipment/Devices:  none    Discharge Condition:  stable   CODE STATUS:  Full code   Consultations:  neurology    Discharge Diagnoses:  Principal Problem:   Acute encephalopathy Active Problems:   Essential hypertension   Uncontrolled type 2 diabetes mellitus with diabetic polyneuropathy, with long-term current use of insulin (HCC)   Elevated LFTs      Brief Summary: The patient is a 58 year old female with a history of COPD who continues to smoke, hypertension, insulin requiring diabetes mellitus, seizure disorder, ITP status post splenectomy who was brought from home where she was found on the floor. She was unable to give any history.  On arrival to the emergency room, she was with significant aphasia. Previous records also indicate that she is on chronic pain medications and anxiety medications. Patient was just repeating sentences. There was no focal deficit. She was found with mild LFT elevation. CT scan of the abdomen did not show any significant findings. Chest x-ray with suspected pneumonia.MRI showed bilateral nonspecific changes.  She was last admitted from 08/14/2018 through 08/15/2018 4 anemia  Hospital Course:  Acute Metabolic Encephalopathy, improved  -? Polypharmacy,  possibility of seizure and postictal state -Neurology was consulted and sedating medications were held -Urine tox screen was negative -EEG showed> background activity is slow and poorly organizedIt consists of low voltage,polymorphic delta activity that is continuous and diffusely distributed. There are frequent periodic discharges of triphasic morphology that are seen bihemispherically, but more  common over the left hemisphere.  Hyperventilationand intermittent photic stimulation were notperformed. -MRI did not show an acute infarction-see full report below - Mental status is completely back to baseline- neurology signed off on 3/13 -  no changes in her medications have been made but she is advised to take her medications carefully as ordered  Thrombocytopenia -History of ITP-  is post splenectomy - Hematology was consulted and patient was evaluated by Amber Faulkner who recommended resumption of prednisone 30 mg daily for minimal of 10 days and therefore she is going to be discharged with 60 mg of prednisone daily - Her platelets popped as low as 64 and are subsequently improving -she has an outpatient follow-up appointment with Amber Faulkner  Elevated LFTs -  AST 407, ALT 593 with normal alk phos and bilirubin - LFTs steadily improving -  CT abdomen pelvis 3/11 was negative for any abnormality -  hepatitis panel positive for elevated HCV antibody but HCV was not detected -  d/c statin  Anemia of chronic disease -Hemoglobin 7.4 -Hematology recommends holding off on blood transfusion for now due to deficiency of  blood products  COPD Remained stable  Right foot fracture- R foot Lisfranc injury - The patient suffered in January after a seizure and was diagnosed with a fracture of her right foot - she has not been able to follow-up for her outpatient eval's and therefore Amber Faulkner evaluated her in the hospital -He has advised that she may be weightbearing as tolerated-she will also continue to wear her walking boot - She will follow-up with her orthopedic surgeon in [place] or if she chooses to with Amber Faulkner   Discharge Exam: Vitals:   09/26/18 0751 09/26/18 0836  BP:  135/75   Pulse: 70   Resp:    Temp: (!) 97.4 F (36.3 C)   SpO2: 97% 95%   Vitals:   09/26/18 0011 09/26/18 0601 09/26/18 0751 09/26/18 0836  BP: 132/73  135/75   Pulse: 80  70   Resp:       Temp: 98 F (36.7 C)  (!) 97.4 F (36.3 C)   TempSrc: Oral  Oral   SpO2: 94%  97% 95%  Weight:  83.7 kg    Height:        General: Pt is alert, awake, not in acute distress Cardiovascular: RRR, S1/S2 +, no rubs, no gallops Respiratory: CTA bilaterally, no wheezing, no rhonchi Abdominal: Soft, NT, ND, bowel sounds + Extremities: no edema, no cyanosis   Discharge Instructions  Discharge Instructions    Diet - low sodium heart healthy   Complete by:  As directed    Diet Carb Modified   Complete by:  As directed    Increase activity slowly   Complete by:  As directed      Allergies as of 09/26/2018      Reactions   Cephalosporins Anaphylaxis   Vancomycin Anaphylaxis   Penicillins Hives, Rash   Ampicillin Other (See Comments)   Other reaction(s): anaphylaxis   Feraheme [ferumoxytol] Itching   Resolved with Benadryl   Lisinopril Other (See Comments)   Nausea,dizziness,headache   Penicillin G Other (See Comments)   Other reaction(s): anaphylaxis      Medication List    STOP taking these medications   levofloxacin 750 MG tablet Commonly known as:  LEVAQUIN   rosuvastatin 20 MG tablet Commonly known as:  CRESTOR     TAKE these medications   Advair Diskus 250-50 MCG/DOSE Aepb Generic drug:  Fluticasone-Salmeterol Inhale 1 puff into the lungs 2 times daily.   albuterol 108 (90 Base) MCG/ACT inhaler Commonly known as:  PROVENTIL HFA;VENTOLIN HFA Inhale 2 puffs into the lungs every 6 (six) hours as needed for wheezing or shortness of breath. What changed:  See the new instructions.   AMBULATORY NON FORMULARY MEDICATION Rolling Walker use as needed.  Disp 1 G35   Calcium 600 600 MG Tabs tablet Generic drug:  calcium carbonate Take 600 mg by mouth 2 (two) times daily with a meal.   carisoprodol 350 MG tablet Commonly known as:  SOMA Take 350 mg by mouth 3 (three) times daily.   cholecalciferol 25 MCG (1000 UT) tablet Commonly known as:  VITAMIN D3 Take  1,000 Units by mouth daily.   DULoxetine 30 MG capsule Commonly known as:  CYMBALTA Take 30 mg by mouth 2 (two) times daily.   Easy Touch Pen Needles 32G X 6 MM Misc Generic drug:  Insulin Pen Needle USE AS DIRECTED   ferrous sulfate 325 (65 FE) MG tablet Take 1 tablet (325 mg total) by mouth daily.   gabapentin 300 MG capsule Commonly known as:  NEURONTIN Take 1 capsule (300 mg total) by mouth 4 (four) times daily.   Insulin Detemir 100 UNIT/ML Pen Commonly known as:  Levemir FlexTouch Inject 20 Units into the skin 2 (two) times daily.   insulin lispro 100 UNIT/ML KwikPen Commonly known as:  HumaLOG KwikPen Inject 0.15 mLs (15 Units total) into the skin 3 (three) times daily.   omeprazole 40 MG capsule Commonly known as:  PRILOSEC Take 1 capsule (40 mg total) by mouth daily.   ondansetron 8 MG disintegrating tablet Commonly known as:  ZOFRAN-ODT Take 1 tablet (8  mg total) by mouth every 8 (eight) hours as needed for nausea or vomiting.   ONE TOUCH ULTRA TEST test strip Generic drug:  glucose blood USE TO CHECK BLOOD SUGAR THREE TIMES A DAY   Oxycodone HCl 20 MG Tabs Take 20 mg by mouth every 6 (six) hours as needed (pain). And at bedtime as needed   Pharmacist Choice Lancets Misc USE TO CHECK BLOOD SUGAR THREE TIMES A DAY   potassium chloride 10 MEQ tablet Commonly known as:  K-DUR,KLOR-CON Take 1 tablet (10 mEq total) by mouth once for 1 dose. What changed:  when to take this   predniSONE 20 MG tablet Commonly known as:  DELTASONE Take 3 tablets (60 mg total) by mouth daily with breakfast. Start taking on:  September 27, 2018   promethazine 12.5 MG tablet Commonly known as:  PHENERGAN Take 1 tablet (12.5 mg total) by mouth every 6 (six) hours as needed for nausea or vomiting.   QUEtiapine 200 MG tablet Commonly known as:  SEROQUEL Take 1 tablet (200 mg total) by mouth at bedtime.   Spiriva HandiHaler 18 MCG inhalation capsule Generic drug:   tiotropium Place 1 capsule into inhaler and inhale daily.   triamcinolone cream 0.1 % Commonly known as:  KENALOG Apply 1 application topically 2 (two) times daily.   venlafaxine XR 75 MG 24 hr capsule Commonly known as:  EFFEXOR-XR Take 1 capsule (75 mg total) by mouth daily with breakfast. What changed:  See the new instructions.   Vimpat 100 MG Tabs Generic drug:  Lacosamide Take 100 mg by mouth 2 (two) times daily.   vitamin B-12 1000 MCG tablet Commonly known as:  CYANOCOBALAMIN Take 1,000 mcg by mouth daily.      Follow-up Information    [facility] Follow up.   Contact information: [address] [place] [phone]       Amber Marry, MD. Schedule an appointment as soon as possible for a visit in 1 week(s).   Specialty:  Family Medicine Contact information: [address] [place]  [address] [phone]        Zoila Shutter, MD Follow up.   Specialty:  Oncology Why:  Tues 9:30 at [facility] Contact information: [place] [address] [phone]          Allergies  Allergen Reactions  . Cephalosporins Anaphylaxis  . Vancomycin Anaphylaxis  . Penicillins Hives and Rash  . Ampicillin Other (See Comments)    Other reaction(s): anaphylaxis  . Feraheme [Ferumoxytol] Itching    Resolved with Benadryl  . Lisinopril Other (See Comments)    Nausea,dizziness,headache  . Penicillin G Other (See Comments)    Other reaction(s): anaphylaxis     Procedures/Studies:   Ct Head Wo Contrast  Result Date: 09/19/2018 CLINICAL DATA:  58 year old female with increased confusion over the past week, found down today. EXAM: CT HEAD WITHOUT CONTRAST TECHNIQUE: Contiguous axial images were obtained from the base of the skull through the vertex without intravenous contrast. COMPARISON:  PET-CT 01/06/2015. FINDINGS: Brain: Normal cerebral volume. No  midline shift, ventriculomegaly, mass effect, evidence of mass lesion, intracranial hemorrhage or evidence of cortically based acute infarction. Possible small chronic infarct in the right cerebellar hemisphere on series 3, image 7. Mild scattered cerebral white matter hypodensity. Vascular: Calcified atherosclerosis at the skull base. No suspicious intracranial vascular hyperdensity. Skull: No acute osseous abnormality identified. Sinuses/Orbits: Visualized paranasal sinuses and mastoids  are well pneumatized. Minor mucosal thickening in the left maxillary sinus. Other: Negative orbits. Visualized scalp soft tissues are within normal limits. IMPRESSION: 1. No acute intracranial abnormality. 2. Possible small chronic right cerebellar infarct. Mild nonspecific white matter changes most commonly due to small vessel disease. Electronically Signed   By: Genevie Ann M.D.   On: 09/19/2018 19:48   Mr Jeri Cos And Wo Contrast  Result Date: 09/20/2018 CLINICAL DATA:  58 y/o F; increased confusion over the last week. Found on floor today. EXAM: MRI HEAD WITHOUT AND WITH CONTRAST TECHNIQUE: Multiplanar, multiecho pulse sequences of the brain and surrounding structures were obtained without and with intravenous contrast. CONTRAST:  7.5 cc Gadavist COMPARISON:  09/19/2018 CT head. FINDINGS: Brain: Ill-defined foci of reduced diffusion are present within the right greater than left centrum semiovale a posterior aspect of inferior frontal gyrus as well as the splenium of corpus callosum (series 7, image 49). No associated hemorrhage or mass effect. There are several foci of T2 FLAIR hyperintense signal abnormality within subcortical and periventricular white matter, left greater than right posterior limb of internal capsule,, body of corpus callosum, and the right cerebellar hemisphere. There are a few punctate foci of susceptibility hypointensity scattered in a nonspecific distribution throughout the bilateral frontal and parietal  lobes compatible with hemosiderin deposition of chronic microhemorrhage. No extra-axial collection, hydrocephalus, mass effect, or herniation. After administration of intravenous contrast there is no abnormal enhancement. Vascular: Normal flow voids. Skull and upper cervical spine: Normal marrow signal. Sinuses/Orbits: Mild paranasal sinus mucosal thickening greatest in the left maxillary sinus. No abnormal signal of mastoid air cells. Orbits are unremarkable. Other: None. IMPRESSION: 1. Ill-defined white matter lesions with reduced diffusion are present within the left-greater-than-right inferior frontal gyri as well as the splenium of corpus callosum. The pattern is atypical for ischemic infarction. There is a broad differential, for example transit metabolic derangement, demyelination, seizure, drug toxicity (antiepileptic, metronidazole, sympathomimetic.), ETOH encephalopathy, or hypoglycemia. 2. Background of nonspecific white matter hyperintensities may represent chronic sequelae of the acute process described above or age advanced chronic microvascular ischemic changes. Electronically Signed   By: Kristine Garbe M.D.   On: 09/20/2018 00:34   Ct Abdomen Pelvis W Contrast  Result Date: 09/19/2018 CLINICAL DATA:  Right upper quadrant pain. EXAM: CT ABDOMEN AND PELVIS WITH CONTRAST TECHNIQUE: Multidetector CT imaging of the abdomen and pelvis was performed using the standard protocol following bolus administration of intravenous contrast. CONTRAST:  161m OMNIPAQUE IOHEXOL 300 MG/ML  SOLN COMPARISON:  Head CT 01/06/2015 FINDINGS: There is residual contrast material demonstrated throughout the colon. Streak artifact arising from this dense contrast material limits the examination in some areas. Lower chest: Emphysematous changes and linear fibrosis or scarring in the lung bases. Suggestion of patchy airspace infiltrates in the left lung base possibly due to pneumonia. Motion artifact limits  evaluation. Hepatobiliary: No focal liver abnormality is seen. No gallstones, gallbladder wall thickening, or biliary dilatation. Pancreas: Unremarkable. No pancreatic ductal dilatation or surrounding inflammatory changes. Spleen: Surgically absent Adrenals/Urinary Tract: Limited visualization of the kidneys. No obvious abnormalities identified. Bladder wall is not thickened. No bladder filling defects. Residual contrast material in the bladder. Stomach/Bowel: Stomach, small bowel, and colon are not abnormally distended. Limited visualization particularly of the colon due to streak artifact. No obvious inflammatory changes. Appendix is not identified. Vascular/Lymphatic: Limited visualization of the abdominal aorta due to streak artifact but no aneurysm is suggested. Reproductive: Uterus is surgically absent. No obvious pelvic mass although visualization of  the pelvis is limited due to streak artifact. Other: No free air or free fluid in the visualized abdomen. Musculoskeletal: No acute or significant osseous findings. IMPRESSION: 1. Residual contrast material throughout the colon limits examination. 2. No acute process demonstrated in the abdomen or pelvis. No evidence of bowel obstruction or inflammation. 3. Suggestion of patchy airspace infiltrates in the left lung base possibly indicating pneumonia. Electronically Signed   By: Lucienne Capers M.D.   On: 09/19/2018 22:17   Ct Foot Right Wo Contrast  Result Date: 09/24/2018 CLINICAL DATA:  Foot fracture. EXAM: CT OF THE RIGHT FOOT WITHOUT CONTRAST TECHNIQUE: Multidetector CT imaging of the right foot was performed according to the standard protocol. Multiplanar CT image reconstructions were also generated. COMPARISON:  Radiographs dated 09/23/2018 FINDINGS: Bones/Joint/Cartilage There are old incompletely healed fractures of the medial cuneiform and of the bases of the second and third metatarsals. There is slight widening of the joint space between the base  of the second metatarsal and the medial cuneiform but at least some components of the Lisfranc ligament are intact and this slight widening is not felt to be significant. There are no acute fractures. Ligaments Suboptimally assessed by CT. No abnormal widening of the Lisfranc joints. Muscles and Tendons Negative. Soft tissues Negative. IMPRESSION: Old incompletely healed fractures of the midfoot as described. No significant widening of the Lisfranc joints. Electronically Signed   By: Lorriane Shire M.D.   On: 09/24/2018 11:34   Dg Chest Port 1 View  Result Date: 09/22/2018 CLINICAL DATA:  Cough and shortness of breath. EXAM: PORTABLE CHEST 1 VIEW COMPARISON:  09/19/2010 FINDINGS: The cardiomediastinal silhouette is unremarkable. Streaky LEFT basilar opacities are unchanged. There is no evidence of pulmonary edema, suspicious pulmonary nodule/mass, pleural effusion, or pneumothorax. No acute bony abnormalities are identified. Cervical spine fusion changes again noted. IMPRESSION: Unchanged appearance of the chest with streaky LEFT basilar opacities again noted. Electronically Signed   By: Margarette Canada M.D.   On: 09/22/2018 07:41   Dg Chest Port 1 View  Result Date: 09/19/2018 CLINICAL DATA:  58 year old female found down with confusion. EXAM: PORTABLE CHEST 1 VIEW COMPARISON:  Chest radiographs 08/13/2018 and earlier. FINDINGS: Portable AP semi upright view at 1915 hours. Lung volumes and mediastinal contours are within normal limits. Subtotal clearing of left basilar opacity since 08/13/2018. No areas of worsening ventilation. No pneumothorax or pulmonary edema. Hyperdense material in the left upper quadrant may be retained barium at the splenic flexure, uncertain. Extensive prior cervical spine surgery. No acute osseous abnormality identified. IMPRESSION: Regressed but not fully resolved mild left lung base opacity since February. No new cardiopulmonary abnormality. Electronically Signed   By: Genevie Ann M.D.    On: 09/19/2018 19:33   Dg Foot 2 Views Right  Result Date: 09/23/2018 CLINICAL DATA:  Foot pain.  History of prior injury in January. EXAM: RIGHT FOOT - 2 VIEW COMPARISON:  None. FINDINGS: There is a fracture involving the base of the second metatarsal medially. There is also widening of the joint space between the middle and medial cuneiforms and slight lateral subluxation of the first metatarsal base in relation to the medial cuneiform. Findings highly suspicious for Lisfranc ligament tear/rupture. There could be other fractures that are not well demonstrated on the plain films. Mild pes cavus.  Small calcaneal heel spur. IMPRESSION: Plain film findings suspicious for a Lisfranc ligament disruption and fracture at the base of the second metatarsal. No other definite fractures are identified but CT may be  helpful for further evaluation. Electronically Signed   By: Marijo Sanes M.D.   On: 09/23/2018 13:29     The results of significant diagnostics from this hospitalization (including imaging, microbiology, ancillary and laboratory) are listed below for reference.     Microbiology: Recent Results (from the past 240 hour(s))  Urine culture     Status: None   Collection Time: 09/19/18 10:25 PM  Result Value Ref Range Status   Specimen Description URINE, RANDOM  Final   Special Requests NONE  Final   Culture   Final    NO GROWTH Performed at [facility], [address], [place], [place] [address]    Report Status 09/21/2018 FINAL  Final  MRSA PCR Screening     Status: None   Collection Time: 09/20/18  2:10 AM  Result Value Ref Range Status   MRSA by PCR NEGATIVE NEGATIVE Final    Comment:        The GeneXpert MRSA Assay (FDA approved for NASAL specimens only), is one component of a comprehensive MRSA colonization surveillance program. It is not intended to diagnose MRSA infection nor to guide or monitor treatment for MRSA infections. Performed at [facility], [place] [address] [place], [place] [address]      Labs: BNP (last 3 results) No results for input(s): BNP in the last 8760 hours. Basic Metabolic Panel: Recent Labs  Lab 09/22/18 0259 09/23/18 0316 09/24/18 0310 09/25/18 0208 09/26/18 0248  NA 137 138 135 136 139  K 3.6 3.7 4.3 3.8 3.9  CL 107 108 108 110 114*  CO2 22 21* 21* 20* 20*  GLUCOSE 102* 150* 235* 252* 121*  BUN _0 26* 26*  CREATININE 0.54 0.56 0.52 0.72 0.65  CALCIUM 8.3* 8.6* 9.3 8.4* 8.6*  MG 1.6* 1.9 1.8 1.8 1.9  PHOS 3.3 3.8 4.7* 4.6 3.5   Liver Function Tests: Recent Labs  Lab 09/22/18 0259 09/23/18 0316 09/24/18 0310 09/25/18 0208 09/26/18 0248  AST 66* 32 _1 ALT 290* 195* 146* 103* 78*  ALKPHOS 91 89 97 94 95  BILITOT 0.6 0.5 0.6 0.4 0.7  PROT 5.7* 5.8* 5.9* 5.7* 5.8*  ALBUMIN 3.1* 3.2* 3.3* 3.2* 3.3*   Recent Labs  Lab 09/19/18 1949  LIPASE 17   Recent Labs  Lab 09/19/18 1949  AMMONIA 14   CBC: Recent Labs  Lab 09/22/18 0259 09/23/18 0316 09/24/18 0310 09/25/18 0208 09/26/18 0248  WBC 14.4* 14.2* 16.6* 20.3* 19.2*  NEUTROABS 8.1* 7.7 11.3* 14.2* 13.7*  HGB 8.6* 8.1* 8.2* 7.4* 7.4*  HCT 29.0* 28.7* 27.9* 25.2* 26.9*  MCV 73.8* 75.1* 74.8* 74.3* 76.6*  PLT 78* 64* 67* 75* 89*   Cardiac Enzymes: No results for input(s): CKTOTAL, CKMB, CKMBINDEX, TROPONINI in the last 168 hours. BNP: Invalid input(s): POCBNP CBG: Recent Labs  Lab 09/25/18 1057 09/25/18 1717 09/25/18 2057 09/26/18 0748 09/26/18 1136  GLUCAP 196* 425* 142* 98 145*   D-Dimer No results for input(s): DDIMER in the last 72 hours. Hgb A1c No results for input(s): HGBA1C in the last 72 hours. Lipid Profile No results for input(s): CHOL, HDL, LDLCALC, TRIG, CHOLHDL, LDLDIRECT in the last 72 hours. Thyroid function studies No results for input(s): TSH, T4TOTAL, T3FREE, THYROIDAB in the last 72 hours.  Invalid input(s): FREET3 Anemia work up No results for input(s): VITAMINB12, FOLATE, FERRITIN, TIBC,  IRON, RETICCTPCT in the last 72 hours. Urinalysis    Component Value Date/Time   COLORURINE  YELLOW 09/19/2018 2121   APPEARANCEUR CLEAR 09/19/2018 2121   LABSPEC >1.046 (H) 09/19/2018 2121   PHURINE 7.0 09/19/2018 2121   GLUCOSEU NEGATIVE 09/19/2018 2121   HGBUR LARGE (A) 09/19/2018 2121   BILIRUBINUR NEGATIVE 09/19/2018 2121   BILIRUBINUR Negative 02/02/2018 1609   KETONESUR 80 (A) 09/19/2018 2121   PROTEINUR 30 (A) 09/19/2018 2121   UROBILINOGEN 0.2 02/02/2018 1609   NITRITE NEGATIVE 09/19/2018 2121   LEUKOCYTESUR NEGATIVE 09/19/2018 2121   Sepsis Labs Invalid input(s): PROCALCITONIN,  WBC,  LACTICIDVEN Microbiology Recent Results (from the past 240 hour(s))  Urine culture     Status: None   Collection Time: 09/19/18 10:25 PM  Result Value Ref Range Status   Specimen Description URINE, RANDOM  Final   Special Requests NONE  Final   Culture   Final    NO GROWTH Performed at [facility], [place] [address] [place], [place] [address]    Report Status 09/21/2018 FINAL  Final  MRSA PCR Screening     Status: None   Collection Time: 09/20/18  2:10 AM  Result Value Ref Range Status   MRSA by PCR NEGATIVE NEGATIVE Final    Comment:        The GeneXpert MRSA Assay (FDA approved for NASAL specimens only), is one component of a comprehensive MRSA colonization surveillance program. It is not intended to diagnose MRSA infection nor to guide or monitor treatment for MRSA infections. Performed at [facility], [place] [address], [place], [place] [address]      Time coordinating discharge in minutes: 2  SIGNED:   Debbe Odea, MD  [facility] 09/26/2018, 3:14 PM Pager   If 7PM-7AM, please contact night-coverage [email or web address] Password [email or web address]

## 2018-10-02 ENCOUNTER — Other Ambulatory Visit: Payer: Self-pay

## 2018-10-02 ENCOUNTER — Telehealth: Payer: Self-pay | Admitting: Internal Medicine

## 2018-10-02 ENCOUNTER — Inpatient Hospital Stay: Payer: Medicare Other

## 2018-10-02 ENCOUNTER — Inpatient Hospital Stay: Payer: Medicare Other | Attending: Internal Medicine | Admitting: Internal Medicine

## 2018-10-02 VITALS — BP 150/89 | HR 85 | Temp 98.7°F | Resp 19 | Ht 65.0 in | Wt 192.0 lb

## 2018-10-02 DIAGNOSIS — Z8701 Personal history of pneumonia (recurrent): Secondary | ICD-10-CM | POA: Diagnosis not present

## 2018-10-02 DIAGNOSIS — D72829 Elevated white blood cell count, unspecified: Secondary | ICD-10-CM | POA: Diagnosis not present

## 2018-10-02 DIAGNOSIS — F4310 Post-traumatic stress disorder, unspecified: Secondary | ICD-10-CM

## 2018-10-02 DIAGNOSIS — E785 Hyperlipidemia, unspecified: Secondary | ICD-10-CM

## 2018-10-02 DIAGNOSIS — E119 Type 2 diabetes mellitus without complications: Secondary | ICD-10-CM

## 2018-10-02 DIAGNOSIS — Z79899 Other long term (current) drug therapy: Secondary | ICD-10-CM

## 2018-10-02 DIAGNOSIS — I10 Essential (primary) hypertension: Secondary | ICD-10-CM | POA: Diagnosis not present

## 2018-10-02 DIAGNOSIS — Z888 Allergy status to other drugs, medicaments and biological substances status: Secondary | ICD-10-CM | POA: Diagnosis not present

## 2018-10-02 DIAGNOSIS — Z8673 Personal history of transient ischemic attack (TIA), and cerebral infarction without residual deficits: Secondary | ICD-10-CM

## 2018-10-02 DIAGNOSIS — D693 Immune thrombocytopenic purpura: Secondary | ICD-10-CM

## 2018-10-02 DIAGNOSIS — R740 Nonspecific elevation of levels of transaminase and lactic acid dehydrogenase [LDH]: Secondary | ICD-10-CM | POA: Diagnosis not present

## 2018-10-02 DIAGNOSIS — D696 Thrombocytopenia, unspecified: Secondary | ICD-10-CM

## 2018-10-02 DIAGNOSIS — Z794 Long term (current) use of insulin: Secondary | ICD-10-CM

## 2018-10-02 DIAGNOSIS — F17210 Nicotine dependence, cigarettes, uncomplicated: Secondary | ICD-10-CM | POA: Diagnosis not present

## 2018-10-02 DIAGNOSIS — Z21 Asymptomatic human immunodeficiency virus [HIV] infection status: Secondary | ICD-10-CM

## 2018-10-02 DIAGNOSIS — G35 Multiple sclerosis: Secondary | ICD-10-CM

## 2018-10-02 DIAGNOSIS — Z9081 Acquired absence of spleen: Secondary | ICD-10-CM | POA: Insufficient documentation

## 2018-10-02 DIAGNOSIS — R7989 Other specified abnormal findings of blood chemistry: Secondary | ICD-10-CM | POA: Diagnosis not present

## 2018-10-02 DIAGNOSIS — D508 Other iron deficiency anemias: Secondary | ICD-10-CM | POA: Diagnosis not present

## 2018-10-02 LAB — CBC WITH DIFFERENTIAL (CANCER CENTER ONLY)
Abs Immature Granulocytes: 0.16 10*3/uL — ABNORMAL HIGH (ref 0.00–0.07)
BASOS PCT: 0 %
Basophils Absolute: 0.1 10*3/uL (ref 0.0–0.1)
Eosinophils Absolute: 0.2 10*3/uL (ref 0.0–0.5)
Eosinophils Relative: 1 %
HCT: 32.2 % — ABNORMAL LOW (ref 36.0–46.0)
Hemoglobin: 9.1 g/dL — ABNORMAL LOW (ref 12.0–15.0)
Immature Granulocytes: 1 %
Lymphocytes Relative: 14 %
Lymphs Abs: 3 10*3/uL (ref 0.7–4.0)
MCH: 24.1 pg — ABNORMAL LOW (ref 26.0–34.0)
MCHC: 28.3 g/dL — ABNORMAL LOW (ref 30.0–36.0)
MCV: 85.4 fL (ref 80.0–100.0)
Monocytes Absolute: 1.9 10*3/uL — ABNORMAL HIGH (ref 0.1–1.0)
Monocytes Relative: 9 %
Neutro Abs: 15.9 10*3/uL — ABNORMAL HIGH (ref 1.7–7.7)
Neutrophils Relative %: 75 %
Platelet Count: 136 10*3/uL — ABNORMAL LOW (ref 150–400)
RBC: 3.77 MIL/uL — ABNORMAL LOW (ref 3.87–5.11)
RDW: 32 % — ABNORMAL HIGH (ref 11.5–15.5)
WBC Count: 21.1 10*3/uL — ABNORMAL HIGH (ref 4.0–10.5)
nRBC: 1.1 % — ABNORMAL HIGH (ref 0.0–0.2)

## 2018-10-02 LAB — CMP (CANCER CENTER ONLY)
ALT: 29 U/L (ref 0–44)
AST: 13 U/L — ABNORMAL LOW (ref 15–41)
Albumin: 3.3 g/dL — ABNORMAL LOW (ref 3.5–5.0)
Alkaline Phosphatase: 98 U/L (ref 38–126)
Anion gap: 12 (ref 5–15)
BUN: 11 mg/dL (ref 6–20)
CO2: 24 mmol/L (ref 22–32)
Calcium: 8.4 mg/dL — ABNORMAL LOW (ref 8.9–10.3)
Chloride: 108 mmol/L (ref 98–111)
Creatinine: 0.59 mg/dL (ref 0.44–1.00)
GFR, Est AFR Am: >60 mL/min (ref >60)
GFR, Estimated: >60 mL/min (ref >60)
GLUCOSE: 150 mg/dL — AB (ref 70–99)
Potassium: 3.4 mmol/L — ABNORMAL LOW (ref 3.5–5.1)
Sodium: 144 mmol/L (ref 135–145)
Total Bilirubin: 0.3 mg/dL (ref 0.3–1.2)
Total Protein: 6.2 g/dL — ABNORMAL LOW (ref 6.5–8.1)

## 2018-10-02 LAB — LACTATE DEHYDROGENASE: LDH: 221 U/L — ABNORMAL HIGH (ref 98–192)

## 2018-10-02 MED ORDER — PREDNISONE 20 MG PO TABS: ORAL_TABLET | ORAL | 0 refills | Status: DC

## 2018-10-02 NOTE — Telephone Encounter (Signed)
Gave avs and calendar ° °

## 2018-10-02 NOTE — Progress Notes (Signed)
Diagnosis Chronic ITP (idiopathic thrombocytopenia) (HCC) - Plan: CBC with Differential ([place] Only), CMP ([place] only), Lactate dehydrogenase (LDH), CBC with Differential (Cancer Center Only), CMP ([place] only), Lactate dehydrogenase (LDH), Ferritin  Other iron deficiency anemia - Plan: CBC with Differential ([place] Only), CMP ([place] only), Lactate dehydrogenase (LDH), Ferritin  Staging Cancer Staging No matching staging information was found for the patient.  Assessment and Plan:  1.  Immune mediated thrombocytopenia. Pt was followed by Roxana Hires at [facility].  She is also followed by Verdell Carmine.    Pt has history of ITP treated in the past with steroids, IVIG and splenectomy.  She was admitted 09/20/2018 due to AMS.  Plt count on admission was 106,000.  Plt count has trended downward and is now 64,000. Cr 0.56.  LDH 199.  No significant fragmentation noted on smear.  She has elevated ALT of 195.  Pt had CT abdomen and pelvis done 09/19/2018 that showed  IMPRESSION: 1. Residual contrast material throughout the colon limits examination. 2. No acute process demonstrated in the abdomen or pelvis. No evidence of bowel obstruction or inflammation. 3. Suggestion of patchy airspace infiltrates in the left lung base possibly indicating pneumonia.  Pt afebrile.  Suspected thrombocytopenia multifactorial due to reported ibuprofen use as well as LFT abnormalities.  She also had platelet clumping noted on smear.   Due to history of ITP, pt was given trial of steroids with Prednisone 60 mg po daily with food. Plt count improved with steroids.   Labs done today 10/02/2018 reviewed and showed WCB 21.1 HB 9.1 plts 136,000.  Chemistries WNL with K+ 3.4 Cr 0.59 normal LFTs.  Pt will begin to taper Prednisone and she is provided tapering schedule.  Prescription for #40 sent to pharmacy.    Pt will RTC in 3 weeks and should be on 20 mg Prednisone at that time for  follow-up and repeat labs.    2.  Iron deficiency anemia.  CT shows no significant abdominal abnormalities.  Pt was treated with IV iron while hospitalized.  HB improved at 9.1.  Will repeat labs on RTC.  Pt should follow-up with GI as directed.    3.  Leucocytosis.  WBC 21.1.   Likely due to Prednisone.  Will repeat labs in 3 weeks on RTC.    4.  Elevated LFTs.  ALT 195 while hospitalized.  Likely due to shock and hypotension.  LFTs WNL on labs done today 10/02/2018.    5.  Reported HIV+.  Recent HIV testing was negative.  If Hiv+ this can also affect plt counts. Follow-up with PCP or ID as directed.    6.  ? LLL pneumonia.  Pt afebrile.  Treated with abx while hospitalized.  Follow-up with PCP if ongoing symptoms.    7. Polypharmacy and reported substance abuse.  Follow-up with PCP.   8.  Smoking.  Cessation recommended.  Follow-up with PCP.    25 minutes spent with more than 50% spent in review of records, counseling and coordination of care.    Current Status:  Pt is seen for follow-up.  She is here to go over labs.  She remains on prednisone.     Problem List Patient Active Problem List   Diagnosis Date Noted  . Acute encephalopathy [G93.40] 09/20/2018  . Abdominal pain [R10.9] 09/20/2018  . Elevated LFTs [R94.5] 09/20/2018  . Encephalopathy acute [G93.40] 09/20/2018  . Symptomatic anemia [D64.9] 08/14/2018  . Odynophagia [R13.10] 08/14/2018  . Migraine  headache [G43.909] 02/02/2018  . Diabetes mellitus with proteinuria ([place]) [E11.29, R80.9] 01/05/2017  . Uncontrolled type 2 diabetes mellitus with diabetic polyneuropathy, with long-term current use of insulin ([place]) [E11.42, Z79.4, E11.65] 10/27/2015  . History of stroke [Z86.73] 07/16/2015  . COPD, moderate ([place]) [J44.9] 04/21/2015  . Cervical spinal cord compression (HCC) [G95.20] 04/14/2015  . PTSD (post-traumatic stress disorder) [F43.10] 02/04/2015  . Multiple sclerosis exacerbation ([place]) [G35] 12/05/2014  .  Neuropathy [G62.9] 12/30/2011  . EDEMA [R60.9] 09/03/2010  . DYSPNEA [R06.02] 09/03/2010  . Thrombocytopenia ([place]) [D69.6] 06/02/2010  . KNEE PAIN, LEFT [M25.569] 05/26/2009  . SMOKER [F17.200] 04/28/2009  . BACK PAIN, LUMBAR [M54.5] 01/28/2009  . Depression [F32.9] 09/02/2008  . INSOMNIA [G47.00] 09/02/2008  . Hyperlipidemia [E78.5] 02/21/2008  . Essential hypertension [I10] 02/21/2008    Past Medical History Past Medical History:  Diagnosis Date  . ADD (attention deficit disorder)   . Anxiety   . Chronic pain   . Diabetes mellitus   . HIV infection ([place])   . Hyperlipidemia   . Neuromuscular disorder ([place])   . Pneumonia 09/2018    Past Surgical History Past Surgical History:  Procedure Laterality Date  . ABDOMINAL HYSTERECTOMY  1991   complete  . CARPAL TUNNEL RELEASE  1993-1994   both wrist  . CERVICAL DISCECTOMY  03/25/2017   Dr. Cyndia Diver, [place]  . CESAREAN SECTION  1980  . HERNIA REPAIR  1976  . SPLENECTOMY  09/2016  . TARSAL TUNNEL RELEASE  1993   left ankle  . TUMOR REMOVAL  1993   left thigh    Family History Family History  Problem Relation Age of Onset  . Cancer Sister        hepatic and pancreatic  . Cancer Mother        brain and lung  . COPD Mother   . Hyperlipidemia Father   . Hypertension Father   . Diabetes Father   . Heart disease Father   . Cancer Maternal Aunt        breast     Social History  reports that she has been smoking cigarettes. She has been smoking about 1.50 packs per day. She has never used smokeless tobacco. She reports that she does not drink alcohol or use drugs.  Medications  Current Outpatient Medications:  .  ADVAIR DISKUS 250-50 MCG/DOSE AEPB, Inhale 1 puff into the lungs 2 times daily., Disp: 3 each, Rfl: 3 .  albuterol (PROVENTIL HFA;VENTOLIN HFA) 108 (90 Base) MCG/ACT inhaler, Inhale 2 puffs into the lungs every 6 (six) hours as needed for wheezing or shortness of breath. (Patient taking  differently: Inhale 2 puffs into the lungs every 6 (six) hours as needed for wheezing or shortness of breath. ), Disp: 8.5 g, Rfl: 2 .  calcium carbonate (CALCIUM 600) 600 MG TABS tablet, Take 600 mg by mouth 2 (two) times daily with a meal., Disp: , Rfl:  .  carisoprodol (SOMA) 350 MG tablet, Take 350 mg by mouth 3 (three) times daily., Disp: , Rfl:  .  cholecalciferol (VITAMIN D3) 25 MCG (1000 UT) tablet, Take 1,000 Units by mouth daily., Disp: , Rfl:  .  DULoxetine (CYMBALTA) 30 MG capsule, Take 30 mg by mouth 2 (two) times daily., Disp: , Rfl: 11 .  ferrous sulfate 325 (65 FE) MG tablet, Take 1 tablet (325 mg total) by mouth daily., Disp: 30 tablet, Rfl: 0 .  gabapentin (NEURONTIN) 300 MG capsule, Take 1 capsule (300 mg total) by  mouth 4 (four) times daily., Disp: 120 capsule, Rfl: 3 .  Insulin Detemir (LEVEMIR FLEXTOUCH) 100 UNIT/ML Pen, Inject 20 Units into the skin 2 (two) times daily., Disp: , Rfl:  .  insulin lispro (HUMALOG KWIKPEN) 100 UNIT/ML KwikPen, Inject 0.15 mLs (15 Units total) into the skin 3 (three) times daily., Disp: 15 mL, Rfl: 3 .  Lacosamide (VIMPAT) 100 MG TABS, Take 100 mg by mouth 2 (two) times daily. , Disp: , Rfl:  .  omeprazole (PRILOSEC) 40 MG capsule, Take 1 capsule (40 mg total) by mouth daily., Disp: 90 capsule, Rfl: 3 .  ondansetron (ZOFRAN-ODT) 8 MG disintegrating tablet, Take 1 tablet (8 mg total) by mouth every 8 (eight) hours as needed for nausea or vomiting., Disp: , Rfl:  .  Oxycodone HCl 20 MG TABS, Take 20 mg by mouth every 6 (six) hours as needed (pain). And at bedtime as needed, Disp: , Rfl: 0 .  predniSONE (DELTASONE) 20 MG tablet, Take 3 tablets (60 mg total) by mouth daily with breakfast., Disp: 30 tablet, Rfl: 0 .  promethazine (PHENERGAN) 12.5 MG tablet, Take 1 tablet (12.5 mg total) by mouth every 6 (six) hours as needed for nausea or vomiting., Disp: 30 tablet, Rfl: 0 .  QUEtiapine (SEROQUEL) 200 MG tablet, Take 1 tablet (200 mg total) by mouth at  bedtime., Disp: 90 tablet, Rfl: 1 .  SPIRIVA HANDIHALER 18 MCG inhalation capsule, Place 1 capsule into inhaler and inhale daily., Disp: 90 capsule, Rfl: 3 .  triamcinolone cream (KENALOG) 0.1 %, Apply 1 application topically 2 (two) times daily., Disp: 453.6 g, Rfl: 12 .  venlafaxine XR (EFFEXOR-XR) 75 MG 24 hr capsule, Take 1 capsule (75 mg total) by mouth daily with breakfast. (Patient taking differently: Take 75 mg by mouth daily. Take 1 capsule (75 mg total) by mouth daily with breakfast.), Disp: 90 capsule, Rfl: 1 .  vitamin B-12 (CYANOCOBALAMIN) 1000 MCG tablet, Take 1,000 mcg by mouth daily., Disp: , Rfl:  .  AMBULATORY NON FORMULARY MEDICATION, [place] use as needed.  Disp 1 G35, Disp: 1 each, Rfl: 0 .  EASY TOUCH PEN NEEDLES 32G X 6 MM MISC, USE AS DIRECTED, Disp: 100 each, Rfl: prn .  ONE TOUCH ULTRA TEST test strip, USE TO CHECK BLOOD SUGAR THREE TIMES A DAY, Disp: 500 each, Rfl: 11 .  PHARMACIST CHOICE LANCETS MISC, USE TO CHECK BLOOD SUGAR THREE TIMES A DAY, Disp: 200 each, Rfl: 11 .  potassium chloride (K-DUR,KLOR-CON) 10 MEQ tablet, Take 1 tablet (10 mEq total) by mouth once for 1 dose. (Patient taking differently: Take 10 mEq by mouth daily. ), Disp: 30 tablet, Rfl: 3  Allergies Cephalosporins; Vancomycin; Penicillins; Ampicillin; Feraheme [ferumoxytol]; Lisinopril; and Penicillin g  Review of Systems Review of Systems - Oncology ROS negative   Physical Exam  Vitals Wt Readings from Last 3 Encounters:  10/02/18 192 lb (87.1 kg)  09/26/18 184 lb 9.6 oz (83.7 kg)  09/04/18 178 lb (80.7 kg)   Temp Readings from Last 3 Encounters:  10/02/18 98.7 F (37.1 C) (Oral)  09/26/18 (!) 97.4 F (36.3 C) (Oral)  09/04/18 98.2 F (36.8 C)   BP Readings from Last 3 Encounters:  10/02/18 (!) 150/89  09/26/18 135/75  09/04/18 130/75   Pulse Readings from Last 3 Encounters:  10/02/18 85  09/26/18 70  09/04/18 86    Constitutional: Well-developed, well-nourished, and  in no distress.   HENT: Head: Normocephalic and atraumatic.  Mouth/Throat: No oropharyngeal exudate. Mucosa  moist. Eyes: Pupils are equal, round, and reactive to light. Conjunctivae are normal. No scleral icterus.  Neck: Normal range of motion. Neck supple. No JVD present.  Cardiovascular: Normal rate, regular rhythm and normal heart sounds.  Exam reveals no gallop and no friction rub.   No murmur heard. Pulmonary/Chest: Effort normal and breath sounds normal. No respiratory distress. Coarse BS.   Abdominal: Soft. Bowel sounds are normal. No distension. There is no tenderness. There is no guarding.  Musculoskeletal: No edema or tenderness.  Lymphadenopathy: No cervical, axillary or supraclavicular adenopathy.  Neurological: Alert and oriented to person, place, and time. No cranial nerve deficit.  Skin: Skin is warm and dry. No rash noted. No erythema. No pallor.  Psychiatric: Affect and judgment normal.   Labs Appointment on 10/02/2018  Component Date Value Ref Range Status  . WBC Count 10/02/2018 21.1* 4.0 - 10.5 K/uL Final  . RBC 10/02/2018 3.77* 3.87 - 5.11 MIL/uL Final  . Hemoglobin 10/02/2018 9.1* 12.0 - 15.0 g/dL Final  . HCT 10/02/2018 32.2* 36.0 - 46.0 % Final  . MCV 10/02/2018 85.4  80.0 - 100.0 fL Final  . MCH 10/02/2018 24.1* 26.0 - 34.0 pg Final  . MCHC 10/02/2018 28.3* 30.0 - 36.0 g/dL Final  . RDW 10/02/2018 32.0* 11.5 - 15.5 % Final  . Platelet Count 10/02/2018 136* 150 - 400 K/uL Final  . nRBC 10/02/2018 1.1* 0.0 - 0.2 % Final  . Neutrophils Relative % 10/02/2018 75  % Final  . Neutro Abs 10/02/2018 15.9* 1.7 - 7.7 K/uL Final  . Lymphocytes Relative 10/02/2018 14  % Final  . Lymphs Abs 10/02/2018 3.0  0.7 - 4.0 K/uL Final  . Monocytes Relative 10/02/2018 9  % Final  . Monocytes Absolute 10/02/2018 1.9* 0.1 - 1.0 K/uL Final  . Eosinophils Relative 10/02/2018 1  % Final  . Eosinophils Absolute 10/02/2018 0.2  0.0 - 0.5 K/uL Final  . Basophils Relative 10/02/2018 0  %  Final  . Basophils Absolute 10/02/2018 0.1  0.0 - 0.1 K/uL Final  . Immature Granulocytes 10/02/2018 1  % Final  . Abs Immature Granulocytes 10/02/2018 0.16* 0.00 - 0.07 K/uL Final   Performed at [facility], [place] [address] [place], [place] [address]  . Sodium 10/02/2018 144  135 - 145 mmol/L Final  . Potassium 10/02/2018 3.4* 3.5 - 5.1 mmol/L Final  . Chloride 10/02/2018 108  98 - 111 mmol/L Final  . CO2 10/02/2018 24  22 - 32 mmol/L Final  . Glucose, Bld 10/02/2018 150* 70 - 99 mg/dL Final  . BUN 10/02/2018 11  6 - 20 mg/dL Final  . Creatinine 10/02/2018 0.59  0.44 - 1.00 mg/dL Final  . Calcium 10/02/2018 8.4* 8.9 - 10.3 mg/dL Final  . Total Protein 10/02/2018 6.2* 6.5 - 8.1 g/dL Final  . Albumin 10/02/2018 3.3* 3.5 - 5.0 g/dL Final  . AST 10/02/2018 13* 15 - 41 U/L Final  . ALT 10/02/2018 29  0 - 44 U/L Final  . Alkaline Phosphatase 10/02/2018 98  38 - 126 U/L Final  . Total Bilirubin 10/02/2018 0.3  0.3 - 1.2 mg/dL Final  . GFR, Est Non Af Am 10/02/2018 >60  >60 mL/min Final  . GFR, Est AFR Am 10/02/2018 >60  >60 mL/min Final  . Anion gap 10/02/2018 12  5 - 15 Final   Performed at [facility], [place] [address] [place], [place] [address]  . LDH 10/02/2018 221* 98 - 192 U/L  Final   Performed at [facility], [place] [address], [place], [place] [address]     Pathology Orders Placed This Encounter  Procedures  . CBC with Differential (Cancer Center Only)    Standing Status:   Future    Number of Occurrences:   1    Standing Expiration Date:   10/02/2019  . CMP ([place] only)    Standing Status:   Future    Number of Occurrences:   1    Standing Expiration Date:   10/02/2019  . Lactate dehydrogenase (LDH)    Standing Status:   Future    Number of Occurrences:   1    Standing Expiration Date:   10/02/2019  . CBC with Differential (Cancer Center Only)    Standing Status:   Future    Standing  Expiration Date:   10/02/2019  . CMP ([place] only)    Standing Status:   Future    Standing Expiration Date:   10/02/2019  . Lactate dehydrogenase (LDH)    Standing Status:   Future    Standing Expiration Date:   10/02/2019  . Ferritin    Standing Status:   Future    Standing Expiration Date:   10/02/2019       Zoila Shutter MD

## 2018-10-19 ENCOUNTER — Telehealth: Payer: Self-pay | Admitting: Internal Medicine

## 2018-10-19 NOTE — Telephone Encounter (Signed)
[facility] out of office 4/14. Moved lab/fu to 4/17. Left message.

## 2018-10-23 ENCOUNTER — Other Ambulatory Visit: Payer: Medicare Other

## 2018-10-23 ENCOUNTER — Ambulatory Visit: Payer: Medicare Other | Admitting: Internal Medicine

## 2018-10-24 ENCOUNTER — Other Ambulatory Visit: Payer: Self-pay | Admitting: Family Medicine

## 2018-10-24 NOTE — Telephone Encounter (Signed)
[place] for 90 day? She was last seen 09/04/2018 for DM her follow up w/you is 12/03/2018. I have not changed the amount on the script.Maryruth Eve, [name], CMA

## 2018-10-25 ENCOUNTER — Telehealth: Payer: Self-pay

## 2018-10-25 NOTE — Telephone Encounter (Signed)
Patient scheduled for a lab appointment and visit with Dr. Walden Field 10/26/18. Patient states she had labs done recently at [facility] and doesn't want labs drawn again. Request for records faxed to number provided by patient- [phone]. Patient instructed to call the office in the morning to confirm if lab work was received. Patient verbalized understanding.

## 2018-10-26 ENCOUNTER — Inpatient Hospital Stay: Payer: Medicare Other | Admitting: Internal Medicine

## 2018-10-26 ENCOUNTER — Telehealth: Payer: Self-pay | Admitting: Internal Medicine

## 2018-10-26 ENCOUNTER — Inpatient Hospital Stay: Payer: Medicare Other

## 2018-10-26 NOTE — Telephone Encounter (Signed)
Scheduled appt per 4/17 sch messa e- pt is aware of appt date and time

## 2018-10-31 ENCOUNTER — Inpatient Hospital Stay: Payer: Medicare Other | Attending: Internal Medicine

## 2018-11-01 ENCOUNTER — Telehealth: Payer: Self-pay | Admitting: Family Medicine

## 2018-11-01 DIAGNOSIS — I10 Essential (primary) hypertension: Secondary | ICD-10-CM

## 2018-11-01 DIAGNOSIS — D693 Immune thrombocytopenic purpura: Secondary | ICD-10-CM

## 2018-11-01 DIAGNOSIS — D509 Iron deficiency anemia, unspecified: Secondary | ICD-10-CM

## 2018-11-01 NOTE — Telephone Encounter (Signed)
Pt reports she had some labs ordered by a Provider at the cancer center, but she doesn't have transportation to go back there to get them done. Wants to know if PCP will order so she can get them done in our building.  Also wants to know if there is anything PCP would like to add on. Routing.

## 2018-11-02 ENCOUNTER — Inpatient Hospital Stay: Payer: Medicare Other | Admitting: Internal Medicine

## 2018-11-02 NOTE — Telephone Encounter (Signed)
[place] labs ordered. She have have them CC her oncologist when she get them draw.

## 2018-11-02 NOTE — Telephone Encounter (Signed)
Pt advised. Verbalized understanding.

## 2018-11-08 ENCOUNTER — Telehealth: Payer: Self-pay | Admitting: *Deleted

## 2018-11-08 NOTE — Telephone Encounter (Signed)
Called pt's friend earlier asking how to get in touch with pt.  Friend stated pt was asleep, and she will have pt call office back.   No response from pt.  Called pt now at 225 pm.   Per pt she had not had lab drawn from PCP office yet.   Instructed pt to either have labs drawn by PCP or come by our office to have labs drawn.  Informed pt that it is very important for pt to repeat labs so Dr. Walden Field can review results, and to plan pt's care accordingly, and to have follow up appt with MD.  Asked pt to call office and let nurse know what the outcome is.  Pt voiced understanding.

## 2018-11-08 NOTE — Telephone Encounter (Signed)
Attempted to call pt to find out if she had labs drawn at PCP office as instructed by Dr. Walden Field.  No answer.  Left message on voice mail requesting a call back to nurse.

## 2018-11-13 ENCOUNTER — Telehealth: Payer: Self-pay

## 2018-11-13 NOTE — Telephone Encounter (Signed)
[place] called and states the insurance no longer prefers the Advair but does prefer the Breo or Symbicort. Please advise.

## 2018-11-14 MED ORDER — FLUTICASONE FUROATE-VILANTEROL 100-25 MCG/INH IN AEPB: 1.0000 | INHALATION_SPRAY | RESPIRATORY_TRACT | 3 refills | Status: DC

## 2018-11-14 NOTE — Telephone Encounter (Signed)
Left VM with update.  

## 2018-11-14 NOTE — Telephone Encounter (Signed)
OK, new rx sent for Breo.

## 2018-11-18 ENCOUNTER — Other Ambulatory Visit: Payer: Self-pay | Admitting: Family Medicine

## 2018-11-19 ENCOUNTER — Other Ambulatory Visit: Payer: Self-pay | Admitting: *Deleted

## 2018-11-23 ENCOUNTER — Other Ambulatory Visit: Payer: Self-pay | Admitting: Family Medicine

## 2018-11-23 DIAGNOSIS — E876 Hypokalemia: Secondary | ICD-10-CM

## 2018-11-26 ENCOUNTER — Telehealth: Payer: Self-pay | Admitting: *Deleted

## 2018-11-26 NOTE — Telephone Encounter (Signed)
LVM informing pt that her prescription plan will no longer cover the Levemir flextouch pen. Dr. Madilyn Fireman wanted to know if she would be ok with switching to Toujeo?  Asked that she rtn call if this is ok.Amber Faulkner, [name], CMA

## 2018-12-02 ENCOUNTER — Other Ambulatory Visit: Payer: Self-pay | Admitting: Family Medicine

## 2018-12-04 ENCOUNTER — Encounter: Payer: Self-pay | Admitting: Family Medicine

## 2018-12-04 ENCOUNTER — Ambulatory Visit (INDEPENDENT_AMBULATORY_CARE_PROVIDER_SITE_OTHER): Payer: Medicare Other | Admitting: Family Medicine

## 2018-12-04 VITALS — BP 118/72 | HR 125 | Ht 64.0 in | Wt 179.0 lb

## 2018-12-04 DIAGNOSIS — R809 Proteinuria, unspecified: Secondary | ICD-10-CM

## 2018-12-04 DIAGNOSIS — D693 Immune thrombocytopenic purpura: Secondary | ICD-10-CM | POA: Diagnosis not present

## 2018-12-04 DIAGNOSIS — R251 Tremor, unspecified: Secondary | ICD-10-CM

## 2018-12-04 DIAGNOSIS — M47817 Spondylosis without myelopathy or radiculopathy, lumbosacral region: Secondary | ICD-10-CM | POA: Insufficient documentation

## 2018-12-04 DIAGNOSIS — T50905A Adverse effect of unspecified drugs, medicaments and biological substances, initial encounter: Secondary | ICD-10-CM

## 2018-12-04 DIAGNOSIS — J449 Chronic obstructive pulmonary disease, unspecified: Secondary | ICD-10-CM | POA: Diagnosis not present

## 2018-12-04 DIAGNOSIS — F4310 Post-traumatic stress disorder, unspecified: Secondary | ICD-10-CM

## 2018-12-04 DIAGNOSIS — E1129 Type 2 diabetes mellitus with other diabetic kidney complication: Secondary | ICD-10-CM

## 2018-12-04 DIAGNOSIS — M4802 Spinal stenosis, cervical region: Secondary | ICD-10-CM | POA: Insufficient documentation

## 2018-12-04 MED ORDER — INSULIN GLARGINE (1 UNIT DIAL) 300 UNIT/ML ~~LOC~~ SOPN: 20.0000 [IU] | PEN_INJECTOR | Freq: Two times a day (BID) | SUBCUTANEOUS | 1 refills | Status: AC

## 2018-12-04 NOTE — Progress Notes (Signed)
Virtual Visit via Video Note  I connected with Amber Faulkner on 12/04/18 at 10:50 AM EDT by a video enabled telemedicine application and verified that I am speaking with the correct person using two identifiers.   I discussed the limitations of evaluation and management by telemedicine and the availability of in person appointments. The patient expressed understanding and agreed to proceed.  Pt was at home and I was in my office for the virtual visit.     Subjective:    CC: DM  HPI:  Diabetes - no hypoglycemic events. No wounds or sores that are not healing well. No increased thirst or urination. Checking glucose at home. Taking medications as prescribed without any side effects.  She has noticed a tremor in he whole body since had Pneumonia in April. Her aunt had parkinson. not on stimulants like caffeine. She says steroid seem to help her.  She denies any medication changes or known triggers.  She was recently diagnosed with chronic idiopathic thrombocytopenia-she is having labs checked today with Dr. Walden Field for her plateletes   F/U COPD - using albuterol a few times a week.  She has been using her Advair daily.  She is not actively using her Spiriva right now.  PTSD/depression-currently on Effexor.  Past medical history, Surgical history, Family history not pertinant except as noted below, Social history, Allergies, and medications have been entered into the medical record, reviewed, and corrections made.   Review of Systems: No fevers, chills, night sweats, weight loss, chest pain, or shortness of breath.   Objective:    General: Speaking clearly in complete sentences without any shortness of breath.  Alert and oriented x3.  Normal judgment. No apparent acute distress. Sitting at home.     Impression and Recommendations:   Diabetes - no hypoglycemic events. No wounds or sores that are not healing well. No increased thirst or urination. Checking glucose at home. Taking  medications as prescribed without any side effects.  Heavily next A1c looks fantastic.  We will plan to go to the lab later today.  Tremor -unclear etiology.  No new medications.  I do not think it is related to the combination of Cymbalta and Effexor as it sounds like she is skipping a lot of doses with her Cymbalta because of intolerance.  It is interesting that she feels better and the tremor seems to go away when she takes steroids.  We will check a cortisol level.  Though it will not technically be a morning serum cortisol.  Medication side effect-she is having a lot of nausea with her Cymbalta so she is often been skipping doses.  Encouraged her to speak with Dr. Berdine Addison who writes this medication for her because if she is not significantly benefiting and it is causing nausea then it may just need to be discontinued.  COPD-she wants to hold off on using the Spiriva and just use the Advair for right now.  Also using albuterol as needed.  If she notices she is starting to use it more frequently then please give [place] a call.  Chronic idiopathic from a cytopenia-getting additional lab work-up this afternoon.  PTSD/depression-doing well on Effexor.  Continue current regimen.  She is not benefiting from the Cymbalta then would recommend we discontinue it.   I discussed the assessment and treatment plan with the patient. The patient was provided an opportunity to ask questions and all were answered. The patient agreed with the plan and demonstrated an understanding of the instructions.  The patient was advised to call back or seek an in-person evaluation if the symptoms worsen or if the condition fails to improve as anticipated.   Beatrice Lecher, MD

## 2018-12-04 NOTE — Progress Notes (Signed)
Pt informed me that she will be ok with switching to Toujeo from Levemir, due to this no longer being covered by her insurance.Amber Faulkner, [place]

## 2018-12-05 LAB — LIPID PANEL
Cholesterol: 226 mg/dL — ABNORMAL HIGH (ref <200)
HDL: 65 mg/dL (ref >=50)
LDL Cholesterol (Calc): 141 mg/dL (calc) — ABNORMAL HIGH
Non-HDL Cholesterol (Calc): 161 mg/dL (calc) — ABNORMAL HIGH (ref <130)
Total CHOL/HDL Ratio: 3.5 (calc) (ref <5.0)
Triglycerides: 92 mg/dL (ref <150)

## 2018-12-05 LAB — CORTISOL: Cortisol, Plasma: 11.3 ug/dL

## 2018-12-05 LAB — HEMOGLOBIN A1C
Hgb A1c MFr Bld: 8.8 % of total Hgb — ABNORMAL HIGH (ref <5.7)
Mean Plasma Glucose: 206 (calc)
eAG (mmol/L): 11.4 (calc)

## 2018-12-05 LAB — TSH: TSH: 1.22 mIU/L

## 2018-12-06 ENCOUNTER — Other Ambulatory Visit: Payer: Self-pay | Admitting: *Deleted

## 2019-01-02 ENCOUNTER — Emergency Department (HOSPITAL_COMMUNITY): Payer: Medicare Other

## 2019-01-02 ENCOUNTER — Emergency Department (HOSPITAL_COMMUNITY)
Admission: EM | Admit: 2019-01-02 | Discharge: 2019-01-02 | Disposition: A | Discharge status: Home / Self Care | Payer: Medicare Other | Attending: Emergency Medicine | Admitting: Emergency Medicine

## 2019-01-02 DIAGNOSIS — Y92039 Unspecified place in apartment as the place of occurrence of the external cause: Secondary | ICD-10-CM | POA: Insufficient documentation

## 2019-01-02 DIAGNOSIS — S300XXA Contusion of lower back and pelvis, initial encounter: Secondary | ICD-10-CM | POA: Diagnosis not present

## 2019-01-02 DIAGNOSIS — R4182 Altered mental status, unspecified: Secondary | ICD-10-CM

## 2019-01-02 DIAGNOSIS — X58XXXA Exposure to other specified factors, initial encounter: Secondary | ICD-10-CM | POA: Insufficient documentation

## 2019-01-02 DIAGNOSIS — Y939 Activity, unspecified: Secondary | ICD-10-CM | POA: Diagnosis not present

## 2019-01-02 DIAGNOSIS — Z20828 Contact with and (suspected) exposure to other viral communicable diseases: Secondary | ICD-10-CM | POA: Insufficient documentation

## 2019-01-02 DIAGNOSIS — Z5320 Procedure and treatment not carried out because of patient's decision for unspecified reasons: Secondary | ICD-10-CM | POA: Insufficient documentation

## 2019-01-02 DIAGNOSIS — S30810A Abrasion of lower back and pelvis, initial encounter: Secondary | ICD-10-CM | POA: Diagnosis not present

## 2019-01-02 DIAGNOSIS — Y999 Unspecified external cause status: Secondary | ICD-10-CM | POA: Diagnosis not present

## 2019-01-02 LAB — COMPREHENSIVE METABOLIC PANEL
ALT: 14 U/L (ref 0–44)
AST: 15 U/L (ref 15–41)
Albumin: 3.4 g/dL — ABNORMAL LOW (ref 3.5–5.0)
Alkaline Phosphatase: 99 U/L (ref 38–126)
Anion gap: 8 (ref 5–15)
BUN: 17 mg/dL (ref 6–20)
CO2: 24 mmol/L (ref 22–32)
Calcium: 8.9 mg/dL (ref 8.9–10.3)
Chloride: 113 mmol/L — ABNORMAL HIGH (ref 98–111)
Creatinine, Ser: 0.69 mg/dL (ref 0.44–1.00)
GFR calc Af Amer: >60 mL/min (ref >60)
GFR calc non Af Amer: >60 mL/min (ref >60)
Glucose, Bld: 215 mg/dL — ABNORMAL HIGH (ref 70–99)
Potassium: 4 mmol/L (ref 3.5–5.1)
Sodium: 145 mmol/L (ref 135–145)
Total Bilirubin: 0.5 mg/dL (ref 0.3–1.2)
Total Protein: 5.9 g/dL — ABNORMAL LOW (ref 6.5–8.1)

## 2019-01-02 LAB — CBC WITH DIFFERENTIAL/PLATELET
Abs Immature Granulocytes: 0.06 10*3/uL (ref 0.00–0.07)
Basophils Absolute: 0.1 10*3/uL (ref 0.0–0.1)
Basophils Relative: 1 %
Eosinophils Absolute: 0.2 10*3/uL (ref 0.0–0.5)
Eosinophils Relative: 2 %
HCT: 33.9 % — ABNORMAL LOW (ref 36.0–46.0)
Hemoglobin: 10 g/dL — ABNORMAL LOW (ref 12.0–15.0)
Immature Granulocytes: 1 %
Lymphocytes Relative: 21 %
Lymphs Abs: 2.4 10*3/uL (ref 0.7–4.0)
MCH: 23.6 pg — ABNORMAL LOW (ref 26.0–34.0)
MCHC: 29.5 g/dL — ABNORMAL LOW (ref 30.0–36.0)
MCV: 80 fL (ref 80.0–100.0)
Monocytes Absolute: 1.3 10*3/uL — ABNORMAL HIGH (ref 0.1–1.0)
Monocytes Relative: 12 %
Neutro Abs: 7.1 10*3/uL (ref 1.7–7.7)
Neutrophils Relative %: 63 %
Platelets: UNDETERMINED 10*3/uL (ref 150–400)
RBC: 4.24 MIL/uL (ref 3.87–5.11)
RDW: 23 % — ABNORMAL HIGH (ref 11.5–15.5)
WBC: 11.1 10*3/uL — ABNORMAL HIGH (ref 4.0–10.5)
nRBC: 0 % (ref 0.0–0.2)

## 2019-01-02 LAB — POCT I-STAT 7, (LYTES, BLD GAS, ICA,H+H)
Acid-base deficit: 1 mmol/L (ref 0.0–2.0)
Bicarbonate: 23.5 mmol/L (ref 20.0–28.0)
Calcium, Ion: 1.28 mmol/L (ref 1.15–1.40)
HCT: 34 % — ABNORMAL LOW (ref 36.0–46.0)
Hemoglobin: 11.6 g/dL — ABNORMAL LOW (ref 12.0–15.0)
O2 Saturation: 95 %
Patient temperature: 98.6
Potassium: 3.8 mmol/L (ref 3.5–5.1)
Sodium: 144 mmol/L (ref 135–145)
TCO2: 25 mmol/L (ref 22–32)
pCO2 arterial: 36.9 mmHg (ref 32.0–48.0)
pH, Arterial: 7.411 (ref 7.350–7.450)
pO2, Arterial: 73 mmHg — ABNORMAL LOW (ref 83.0–108.0)

## 2019-01-02 LAB — URINALYSIS, ROUTINE W REFLEX MICROSCOPIC
Bacteria, UA: NONE SEEN
Bilirubin Urine: NEGATIVE
Glucose, UA: >=500 mg/dL — AB
Ketones, ur: NEGATIVE mg/dL
Leukocytes,Ua: NEGATIVE
Nitrite: NEGATIVE
Protein, ur: NEGATIVE mg/dL
Specific Gravity, Urine: 1.026 (ref 1.005–1.030)
pH: 5 (ref 5.0–8.0)

## 2019-01-02 LAB — RAPID URINE DRUG SCREEN, HOSP PERFORMED
Amphetamines: NOT DETECTED
Barbiturates: NOT DETECTED
Benzodiazepines: NOT DETECTED
Cocaine: NOT DETECTED
Opiates: POSITIVE — AB
Tetrahydrocannabinol: NOT DETECTED

## 2019-01-02 LAB — AMMONIA: Ammonia: 30 umol/L (ref 9–35)

## 2019-01-02 LAB — I-STAT BETA HCG BLOOD, ED (MC, WL, AP ONLY): I-stat hCG, quantitative: <5 m[IU]/mL (ref <5)

## 2019-01-02 LAB — CBG MONITORING, ED: Glucose-Capillary: 149 mg/dL — ABNORMAL HIGH (ref 70–99)

## 2019-01-02 LAB — PROTIME-INR
INR: 1 (ref 0.8–1.2)
Prothrombin Time: 13.3 seconds (ref 11.4–15.2)

## 2019-01-02 LAB — LACTIC ACID, PLASMA
Lactic Acid, Venous: 1.7 mmol/L (ref 0.5–1.9)
Lactic Acid, Venous: 1.5 mmol/L (ref 0.5–1.9)

## 2019-01-02 LAB — APTT: aPTT: 25 seconds (ref 24–36)

## 2019-01-02 LAB — LIPASE, BLOOD: Lipase: 22 U/L (ref 11–51)

## 2019-01-02 LAB — ETHANOL: Alcohol, Ethyl (B): <10 mg/dL (ref <10)

## 2019-01-02 LAB — CK: Total CK: 24 U/L — ABNORMAL LOW (ref 38–234)

## 2019-01-02 LAB — SARS CORONAVIRUS 2 BY RT PCR (HOSPITAL ORDER, PERFORMED IN ~~LOC~~ HOSPITAL LAB): SARS Coronavirus 2: NEGATIVE

## 2019-01-02 MED ORDER — SODIUM CHLORIDE 0.9 % IV BOLUS
1000.0000 mL | Freq: Once | INTRAVENOUS | Status: AC
  Administered 2019-01-02: 1000 mL via INTRAVENOUS

## 2019-01-02 MED ORDER — NALOXONE HCL 0.4 MG/ML IJ SOLN
0.4000 mg | Freq: Once | INTRAMUSCULAR | Status: DC
  Filled 2019-01-02: qty 1

## 2019-01-02 MED ORDER — NALOXONE HCL 0.4 MG/ML IJ SOLN
INTRAMUSCULAR | Status: AC
  Filled 2019-01-02: qty 1

## 2019-01-02 MED ORDER — ALBUTEROL SULFATE HFA 108 (90 BASE) MCG/ACT IN AERS
1.0000 | INHALATION_SPRAY | RESPIRATORY_TRACT | Status: DC | PRN
  Filled 2019-01-02: qty 6.7

## 2019-01-02 NOTE — Discharge Instructions (Signed)
You have been seen today for altered mental status. You have left against medical advise. Please return to the ER if you experience new or worsening symptoms. Please read and follow all provided instructions.   1. Medications: usual home medications 2. Treatment: rest, drink plenty of fluids 3. Follow Up: Please follow up with your primary doctor in 1 day for discussion of your diagnoses and further evaluation after today's visit; if you do not have a primary care doctor use the resource guide provided to find one; Please return to the ER for any new or worsening symptoms. Please obtain all of your results from medical records or have your doctors office obtain the results - share them with your doctor - you should be seen at your doctors office. Call today to arrange your follow up.   Take medications as prescribed. Please review all of the medicines and only take them if you do not have an allergy to them. Return to the emergency room for worsening condition or new concerning symptoms. Follow up with your regular doctor. If you don't have a regular doctor use one of the numbers below to establish a primary care doctor. ?  You should return to the ER if you develop severe or worsening symptoms.   Emergency Department Resource Guide 1) Find a Doctor and Pay Out of Pocket Although you won't have to find out who is covered by your insurance plan, it is a good idea to ask around and get recommendations. You will then need to call the office and see if the doctor you have chosen will accept you as a new patient and what types of options they offer for patients who are self-pay. Some doctors offer discounts or will set up payment plans for their patients who do not have insurance, but you will need to ask so you aren't surprised when you get to your appointment.  2) Contact Your Local Health Department Not all health departments have doctors that can see patients for sick visits, but many do, so it is  worth a call to see if yours does. If you don't know where your local health department is, you can check in your phone book. The CDC also has a tool to help you locate your state's health department, and many state websites also have listings of all of their local health departments.  3) Find a [facility] If your illness is not likely to be very severe or complicated, you may want to try a walk in clinic. These are popping up all over the country in pharmacies, drugstores, and shopping centers. They're usually staffed by nurse practitioners or physician assistants that have been trained to treat common illnesses and complaints. They're usually fairly quick and inexpensive. However, if you have serious medical issues or chronic medical problems, these are probably not your best option.  No Primary Care Doctor: Call [facility] at  [phone] - they can help you locate a primary care doctor that  accepts your insurance, provides certain services, etc. Physician Referral Service- [phone]  Chronic Pain Problems: Organization         Address  Phone   Notes  [facility]  [phone] Patients need to be referred by their primary care doctor.   Medication Assistance: Organization         Address  Phone   Notes  [facility] Medication [facility] [address] [place], [place] [address] [phone] --Must be a  resident of [facility] -- Must have NO insurance coverage whatsoever (no Medicaid/ Medicare, etc.) -- The pt. MUST have a primary care doctor that directs their care regularly and follows them in the community   [facility]  [phone]   [employer]  [phone]    Agencies that provide inexpensive medical care: Organization         Address  Phone   Notes  [place]  [phone]   [facility]    [phone]   [facility] [place], [place]  [phone]   [place] [address], [place] [phone]   [facility]    [phone]   [facility]    [phone]   [place] and [place] [address], [place] Phone:  [phone], Fax:  [phone] Hours of Operation:  9 am - 6 pm, M-F.  Also accepts Medicaid/Medicare and self-pay.  [facility] for [place] [address], [address], [place] Phone: [phone], Fax: [phone]. Hours of Operation:  8:30 am - 5:30 pm, M-F.  Also accepts Medicaid and self-pay.  [facility] [address] [place] Phone: [phone]   [place], [place], [place] [phone], Ext. [phone] Mondays & Thursdays: 7-9 AM.  First 15 patients are seen on a first come, first serve basis.    [place] Providers:  Organization         Address  Phone   Notes  [facility] [address], [place] [phone] Also accepts self-pay patients.  [facility] [address], [place]  [phone]   [place], [address], [place] [phone]   [facility] [address] [place] [phone]   Lucianne Lei [address], [place]   [phone] Only accepts [place] patients after they have their name applied to their card.   Self-Pay (no insurance) in [facility]:  Organization         Address  Phone   Notes  Sickle Cell Patients, [facility] [place] [phone]   [facility] [place] [phone]   [facility]  [place], [place], [place] [phone]   [facility]/Dr. Osei-Bonsu  [address] [place] or [address], [place] [phone] Phone number for both [place] and [place] locations is the same.  [facility] [place] [address], [place] [phone]   [facility] [address], [place] or [address] [phone]   [facility] [address], [place] [phone], phone; [phone], fax Sees patients 1st and 3rd Saturday of every month.  Must not qualify for public or private insurance (i.e. Medicaid, Medicare, [place] [employer] Choice, Veterans' Benefits)  Household income should be no more than 200% of the poverty level The clinic cannot treat you if you are pregnant or think you are pregnant  Sexually transmitted diseases are not treated at the clinic.

## 2019-01-02 NOTE — ED Provider Notes (Signed)
[facility] EMERGENCY DEPARTMENT Provider Note   CSN: [id number] Arrival date & time: 01/02/19  1302    History   Chief Complaint Chief Complaint  Patient presents with   Altered Mental Status    HPI Amber Faulkner is a 58 y.o. female with history of HIV, COPD presents today by EMS for altered mental status.  All history obtained from EMS, patient's caregiver who is also family member reports that patient has not been acting normally today, last seen normal last night, found by caregiver on the floor with bruising of the left side of the back, unknown downtime.  No history of fever/chills, seizure-like activity or any other pertinent history obtained.  I have attempted to contact patient's home via listed phone number, no answer.    HPI  Past Medical History:  Diagnosis Date   ADD (attention deficit disorder)    Anxiety    Chronic pain    Diabetes mellitus    HIV infection ([place])    Hyperlipidemia    Neuromuscular disorder ([place])    Pneumonia 09/2018    Patient Active Problem List   Diagnosis Date Noted   Lumbosacral spondylosis without myelopathy 12/04/2018   Cervical spinal stenosis 12/04/2018   Tremor 12/04/2018   History of encephalopathy 09/20/2018   Elevated LFTs 09/20/2018   Symptomatic anemia 08/14/2018   Odynophagia 08/14/2018   Migraine headache 02/02/2018   Diabetes mellitus with proteinuria ([place]) 01/05/2017   Chronic ITP (idiopathic thrombocytopenia) ([place]) 11/28/2016   Uncontrolled type 2 diabetes mellitus with diabetic polyneuropathy, with long-term current use of insulin ([place]) 10/27/2015   History of stroke 07/16/2015   COPD, moderate ([place]) 04/21/2015   Cervical spinal cord compression ([place]) 04/14/2015   PTSD (post-traumatic stress disorder) 02/04/2015   Multiple sclerosis exacerbation ([place]) 12/05/2014   Neuropathy 12/30/2011   EDEMA 09/03/2010   KNEE PAIN, LEFT 05/26/2009   SMOKER 04/28/2009   BACK  PAIN, LUMBAR 01/28/2009   Depression 09/02/2008   INSOMNIA 09/02/2008   Hyperlipidemia 02/21/2008   Essential hypertension 02/21/2008    Past Surgical History:  Procedure Laterality Date   ABDOMINAL HYSTERECTOMY  1991   complete   CARPAL TUNNEL RELEASE  1993-1994   both wrist   CERVICAL DISCECTOMY  03/25/2017   Dr. Cyndia Diver, [place]   SPLENECTOMY  09/2016   TARSAL TUNNEL RELEASE  1993   left ankle   TUMOR REMOVAL  1993   left thigh     OB History   No obstetric history on file.      Home Medications    Prior to Admission medications   Medication Sig Start Date End Date Taking? Authorizing Provider  albuterol (PROVENTIL HFA;VENTOLIN HFA) 108 (90 Base) MCG/ACT inhaler Inhale 2 puffs into the lungs every 6 (six) hours as needed for wheezing or shortness of breath. Patient taking differently: Inhale 2 puffs into the lungs every 6 (six) hours as needed for wheezing or shortness of breath.  08/27/18   Hali Marry, MD  AMBULATORY NON FORMULARY MEDICATION Rolling Walker use as needed.  Disp 1 G35 08/13/18   Gregor Hams, MD  aspirin 81 MG chewable tablet Chew 1 tablet by mouth daily.    [provider]  calcium carbonate (CALCIUM 600) 600 MG TABS tablet Take 600 mg by mouth 2 (two) times daily with a meal.    [provider]  carisoprodol (SOMA) 350 MG tablet Take 350  mg by mouth 3 (three) times daily.    Lawrence Marseilles, MD  cholecalciferol (VITAMIN D3) 25 MCG (1000 UT) tablet Take 1,000 Units by mouth daily.    [provider]  DULoxetine (CYMBALTA) 30 MG capsule Take 30 mg by mouth 2 (two) times daily. 04/26/18   [provider]  EASY TOUCH PEN NEEDLES 32G X 6 MM MISC USE AS DIRECTED 09/26/14   Hali Marry, MD  ferrous sulfate 325 (65 FE) MG tablet Take 1 tablet (325 mg total) by mouth daily. 08/15/18 08/15/19  Radene Gunning, NP  Fluticasone-Salmeterol (ADVAIR  DISKUS) 250-50 MCG/DOSE AEPB Inhale 1 puff into the lungs 2 (two) times daily.    [provider]  gabapentin (NEURONTIN) 300 MG capsule Take 1 capsule (300 mg total) by mouth 4 (four) times daily. 10/24/18   Hali Marry, MD  Insulin Glargine, 1 Unit Dial, (TOUJEO SOLOSTAR) 300 UNIT/ML SOPN Inject 20 Units into the skin 2 (two) times a day. 12/04/18   Hali Marry, MD  insulin lispro (HUMALOG) 100 UNIT/ML KwikPen Inject 0.15 mLs (15 Units total) into the skin 3 (three) times daily. 12/04/18   Hali Marry, MD  Lacosamide (VIMPAT) 100 MG TABS Take 100 mg by mouth 2 (two) times daily.  12/25/15   [provider]  losartan (COZAAR) 25 MG tablet  11/13/18   [provider]  omeprazole (PRILOSEC) 40 MG capsule Take 1 capsule (40 mg total) by mouth daily. 03/13/18   Hali Marry, MD  ondansetron (ZOFRAN-ODT) 8 MG disintegrating tablet Take 1 tablet (8 mg total) by mouth every 8 (eight) hours as needed for nausea or vomiting. 08/15/18   Black, Lezlie Octave, NP  ONE TOUCH ULTRA TEST test strip USE TO CHECK BLOOD SUGAR THREE TIMES A DAY 04/23/18   Hali Marry, MD  Oxycodone HCl 20 MG TABS Take 20 mg by mouth every 6 (six) hours as needed (pain). And at bedtime as needed    [provider]  PHARMACIST CHOICE LANCETS MISC USE TO CHECK BLOOD SUGAR THREE TIMES A DAY 04/23/18   Hali Marry, MD  potassium chloride (K-DUR) 10 MEQ tablet Take 1 tablet (10 mEq total) by mouth daily. 11/23/18   Hali Marry, MD  QUEtiapine (SEROQUEL) 200 MG tablet Take 1 tablet (200 mg total) by mouth at bedtime. 11/19/18   Hali Marry, MD  rosuvastatin (CRESTOR) 20 MG tablet  11/13/18   [provider]  SPIRIVA HANDIHALER 18 MCG inhalation capsule Place 1 capsule into inhaler and inhale daily. 05/29/18   Hali Marry, MD  triamcinolone cream (KENALOG) 0.1 % Apply 1 application topically 2 (two) times daily. 08/20/18   Gregor Hams, MD  venlafaxine XR (EFFEXOR-XR) 75 MG 24 hr capsule Take 1 capsule (75 mg total) by mouth daily with breakfast. Patient taking differently: Take 75 mg by mouth daily. Take 1 capsule (75 mg total) by mouth daily with breakfast. 09/06/18   Hali Marry, MD  vitamin B-12 (CYANOCOBALAMIN) 1000 MCG tablet Take 1,000 mcg by mouth daily.    [provider]    Family History Family History  Problem Relation Age of Onset   Cancer Sister        hepatic and pancreatic   Cancer Mother        brain and lung   COPD Mother    Hyperlipidemia Father    Hypertension Father    Diabetes Father    Heart disease  Father    Cancer Maternal Aunt        breast    Social History Social History   Tobacco Use   Smoking status: Current Every Day Smoker    Packs/day: 1.50    Types: Cigarettes   Smokeless tobacco: Never Used  Substance Use Topics   Alcohol use: No   Drug use: No     Allergies   Cephalosporins, Vancomycin, Penicillins, Ampicillin, Carisoprodol, Cymbalta [duloxetine hcl], Feraheme [ferumoxytol], Lisinopril, and Penicillin g   Review of Systems Review of Systems  Unable to perform ROS: Mental status change   Physical Exam Updated Vital Signs BP (!) 106/56    Pulse 95    Temp 98.4 F (36.9 C) (Rectal)    Resp (!) 24    SpO2 97%   Physical Exam Constitutional:      General: She is not in acute distress.    Appearance: Normal appearance. She is well-developed. She is obese. She is not ill-appearing or diaphoretic.  HENT:     Head: Normocephalic and atraumatic.     Right Ear: External ear normal.     Left Ear: External ear normal.     Nose: Nose normal.  Eyes:     General: Vision grossly intact. Gaze aligned appropriately.     Pupils: Pupils are equal, round, and reactive to light.  Neck:     Musculoskeletal: Normal range of motion.     Trachea: Trachea and phonation normal. No tracheal deviation.  Cardiovascular:     Rate and Rhythm:  Normal rate and regular rhythm.     Pulses: Normal pulses.     Heart sounds: Normal heart sounds.  Pulmonary:     Effort: Pulmonary effort is normal. No respiratory distress.     Breath sounds: Normal breath sounds.  Abdominal:     General: There is no distension.     Palpations: Abdomen is soft.     Tenderness: There is no abdominal tenderness. There is no guarding or rebound.  Musculoskeletal: Normal range of motion.       Back:     Comments: No midline C/T/L spinal tenderness to palpation, no paraspinal muscle tenderness, no deformity, crepitus, or step-off noted.  - Patient with bruising and abrasion to left lower back without crepitus or deformity, appears new. - Hips stable to compression bilaterally, patient will pull herself to a seated position without assistance or difficulty.  All major joints brought through range of motion without crepitus.  Skin:    General: Skin is warm and dry.  Neurological:     Mental Status: She is lethargic.     GCS: GCS eye subscore is 3. GCS verbal subscore is 3. GCS motor subscore is 5.  Psychiatric:        Behavior: Behavior normal.      ED Treatments / Results  Labs (all labs ordered are listed, but only abnormal results are displayed) Labs Reviewed  CBC WITH DIFFERENTIAL/PLATELET - Abnormal; Notable for the following components:      Result Value   WBC 11.1 (*)    Hemoglobin 10.0 (*)    HCT 33.9 (*)    MCH 23.6 (*)    MCHC 29.5 (*)    RDW 23.0 (*)    Monocytes Absolute 1.3 (*)    All other components within normal limits  COMPREHENSIVE METABOLIC PANEL - Abnormal; Notable for the following components:   Chloride 113 (*)    Glucose, Bld 215 (*)    Total Protein  5.9 (*)    Albumin 3.4 (*)    All other components within normal limits  URINALYSIS, ROUTINE W REFLEX MICROSCOPIC - Abnormal; Notable for the following components:   Glucose, UA >=500 (*)    Hgb urine dipstick SMALL (*)    All other components within normal limits    RAPID URINE DRUG SCREEN, HOSP PERFORMED - Abnormal; Notable for the following components:   Opiates POSITIVE (*)    All other components within normal limits  POCT I-STAT 7, (LYTES, BLD GAS, ICA,H+H) - Abnormal; Notable for the following components:   pO2, Arterial 73.0 (*)    HCT 34.0 (*)    Hemoglobin 11.6 (*)    All other components within normal limits  CULTURE, BLOOD (ROUTINE X 2)  CULTURE, BLOOD (ROUTINE X 2)  SARS CORONAVIRUS 2 (HOSPITAL ORDER, [facility])  LIPASE, BLOOD  LACTIC ACID, PLASMA  AMMONIA  ETHANOL  PROTIME-INR  APTT  LACTIC ACID, PLASMA  CK  I-STAT BETA HCG BLOOD, ED ([facility], [facility], AP ONLY)  CBG MONITORING, ED  I-STAT ARTERIAL BLOOD GAS, ED    EKG None  Radiology Ct Head Wo Contrast  Result Date: 01/02/2019 CLINICAL DATA:  Not communicating, altered level consciousness EXAM: CT HEAD WITHOUT CONTRAST CT CERVICAL SPINE WITHOUT CONTRAST TECHNIQUE: Multidetector CT imaging of the head and cervical spine was performed following the standard protocol without intravenous contrast. Multiplanar CT image reconstructions of the cervical spine were also generated. COMPARISON:  09/19/2018 FINDINGS: CT HEAD FINDINGS Brain: No evidence of acute infarction, hemorrhage, hydrocephalus, extra-axial collection or mass lesion/mass effect. Mild periventricular white matter hypodensity. Vascular: No hyperdense vessel or unexpected calcification. Skull: Normal. Negative for fracture or focal lesion. Sinuses/Orbits: No acute finding. Other: None. CT CERVICAL SPINE FINDINGS Examination of the cervical spine is severely limited by motion artifact. Alignment: Normal. Skull base and vertebrae: No obvious acute fracture. No primary bone lesion or focal pathologic process. Soft tissues and spinal canal: No prevertebral fluid or swelling. No visible canal hematoma. Disc levels: Status post anterior cervical discectomy and fusion of C3 through C7. Upper chest: Negative. Other:  None. IMPRESSION: 1. No acute intracranial pathology. Small-vessel white matter disease. 2. Examination of the cervical spine is severely limited by motion artifact. Within this limitation, there is no obvious fracture. Consider repeat examination with sedation or when mental status permits if there is high, persistent clinical concern for fracture. Status post anterior cervical discectomy fusion of C3 through C7. Electronically Signed   By: Eddie Candle M.D.   On: 01/02/2019 15:11   Ct Cervical Spine Wo Contrast  Result Date: 01/02/2019 CLINICAL DATA:  Not communicating, altered level consciousness EXAM: CT HEAD WITHOUT CONTRAST CT CERVICAL SPINE WITHOUT CONTRAST TECHNIQUE: Multidetector CT imaging of the head and cervical spine was performed following the standard protocol without intravenous contrast. Multiplanar CT image reconstructions of the cervical spine were also generated. COMPARISON:  09/19/2018 FINDINGS: CT HEAD FINDINGS Brain: No evidence of acute infarction, hemorrhage, hydrocephalus, extra-axial collection or mass lesion/mass effect. Mild periventricular white matter hypodensity. Vascular: No hyperdense vessel or unexpected calcification. Skull: Normal. Negative for fracture or focal lesion. Sinuses/Orbits: No acute finding. Other: None. CT CERVICAL SPINE FINDINGS Examination of the cervical spine is severely limited by motion artifact. Alignment: Normal. Skull base and vertebrae: No obvious acute fracture. No primary bone lesion or focal pathologic process. Soft tissues and spinal canal: No prevertebral fluid or swelling. No visible canal hematoma. Disc levels: Status post anterior cervical discectomy and  fusion of C3 through C7. Upper chest: Negative. Other: None. IMPRESSION: 1. No acute intracranial pathology. Small-vessel white matter disease. 2. Examination of the cervical spine is severely limited by motion artifact. Within this limitation, there is no obvious fracture. Consider repeat  examination with sedation or when mental status permits if there is high, persistent clinical concern for fracture. Status post anterior cervical discectomy fusion of C3 through C7. Electronically Signed   By: Eddie Candle M.D.   On: 01/02/2019 15:11   Dg Pelvis Portable  Result Date: 01/02/2019 CLINICAL DATA:  Possible fall EXAM: PORTABLE CHEST 1 VIEW PORTABLE PELVIS 1 VIEW COMPARISON:  09/22/2018 FINDINGS: No acute abnormality of the lungs in AP portable projection. Mild cardiomegaly. No displaced fracture or dislocation of the hips or bilateral proximal femurs in single AP view. Mild osteopenia. Mild bilateral hip joint arthrosis. IMPRESSION: 1. No acute abnormality of the lungs in AP portable projection. Mild cardiomegaly. 2. No displaced fracture or dislocation of the hips or bilateral proximal femurs in single AP view. Mild osteopenia. Please note that plain radiographs are significantly insensitive for hip and pelvic fracture in the setting of osteopenia. Recommend MRI to most sensitively evaluate for fracture and marrow edema if suspected. Electronically Signed   By: Eddie Candle M.D.   On: 01/02/2019 13:56   Dg Chest Portable 1 View  Result Date: 01/02/2019 CLINICAL DATA:  Possible fall EXAM: PORTABLE CHEST 1 VIEW PORTABLE PELVIS 1 VIEW COMPARISON:  09/22/2018 FINDINGS: No acute abnormality of the lungs in AP portable projection. Mild cardiomegaly. No displaced fracture or dislocation of the hips or bilateral proximal femurs in single AP view. Mild osteopenia. Mild bilateral hip joint arthrosis. IMPRESSION: 1. No acute abnormality of the lungs in AP portable projection. Mild cardiomegaly. 2. No displaced fracture or dislocation of the hips or bilateral proximal femurs in single AP view. Mild osteopenia. Please note that plain radiographs are significantly insensitive for hip and pelvic fracture in the setting of osteopenia. Recommend MRI to most sensitively evaluate for fracture and marrow edema if  suspected. Electronically Signed   By: Eddie Candle M.D.   On: 01/02/2019 13:56    Procedures Procedures (including critical care time)  Medications Ordered in ED Medications  albuterol (VENTOLIN HFA) 108 (90 Base) MCG/ACT inhaler 1-2 puff (has no administration in time range)  naloxone (NARCAN) 0.4 MG/ML injection (has no administration in time range)  naloxone [facility]) injection 0.4 mg (has no administration in time range)  sodium chloride 0.9 % bolus 1,000 mL (1,000 mLs Intravenous New Bag/Given 01/02/19 1413)     Initial Impression / Assessment and Plan / ED Course  I have reviewed the triage vital signs and the nursing notes.  Pertinent labs & imaging results that were available during my care of the patient were reviewed by me and considered in my medical decision making (see chart for details).     ABG unremarkable Urinalysis with small hemoglobin and greater than 500 glucose, no signs of infection Ammonia within normal limits Ethanol within normal limits CMP unremarkable APTT within normal limits PT/INR within normal limits CBC with leukocytosis of 11.1, hemoglobin 10.0  CT head/C-spine: IMPRESSION: 1. No acute intracranial pathology. Small-vessel white matter disease. 2. Examination of the cervical spine is severely limited by motion artifact. Within this limitation, there is no obvious fracture. Consider repeat examination with sedation or when mental status permits if there is high, persistent clinical concern for fracture. Status post anterior cervical discectomy fusion of C3 through C7  CXR:  IMPRESSION: 1. No acute abnormality of the lungs in AP portable projection. Mild cardiomegaly. 2. No displaced fracture or dislocation of the hips or bilateral proximal femurs in single AP view. Mild osteopenia. Please note that plain radiographs are significantly insensitive for hip and pelvic fracture in the setting of osteopenia. Recommend MRI to most sensitively evaluate for  fracture and marrow edema if suspected.  DG Pelvis: IMPRESSION: 1. No acute abnormality of the lungs in AP portable projection. Mild cardiomegaly. 2. No displaced fracture or dislocation of the hips or bilateral proximal femurs in single AP view. Mild osteopenia. Please note that plain radiographs are significantly insensitive for hip and pelvic fracture in the setting of osteopenia. Recommend MRI to most sensitively evaluate for fracture and marrow edema if suspected.  - Patient reassessed no change to medical status, occasionally sitting up and laying back down. - Patient has remained lethargic here in the emergency department she will occasionally pull herself to a seated position attempt to get out of bed and has been redirected multiple times by nursing staff.  Nursing staff asked patient if she has been using narcotics for which she responded "yeah" which led to patient being administered 0.4 mg of Narcan without change to mental status, it appears that patient responding with yes to any question that is asked. VSS.  Rediscussed case with Dr. Wilson Singer, will seek admission for AMS at this time.  Care handoff given to Darlin Drop PA-C at shift change, plan of care is to follow-up on remainder of lab work, reassess, consult for admission.    Note: Portions of this report may have been transcribed using voice recognition software. Every effort was made to ensure accuracy; however, inadvertent computerized transcription errors may still be present. Final Clinical Impressions(s) / ED Diagnoses   Final diagnoses:  Altered mental status, unspecified altered mental status type    ED Discharge Orders    None       Gari Crown 01/02/19 1644    Virgel Manifold, MD 01/18/19 [phone]

## 2019-01-02 NOTE — ED Notes (Signed)
Pt stated that she wants to leave. Informed the PA.

## 2019-01-02 NOTE — ED Notes (Signed)
Family at bedside. 

## 2019-01-02 NOTE — ED Triage Notes (Addendum)
Caregiver states not acting right. Hx of seizure and platelets low. Bruising to left side. May have fallen- found on floor by caregiver. No seizure activity noted. Alert to herself and follows commands but when not speaking. Usually verbal. Recent fall and ankle fx. Boot on r foot- broken in january

## 2019-01-02 NOTE — ED Notes (Signed)
Pt starting to arouse more- more communicative. Headed to CT

## 2019-01-02 NOTE — ED Notes (Signed)
Daughter coming to get her and takde her home  After she refused admission  She has signed out ama

## 2019-01-02 NOTE — ED Provider Notes (Addendum)
Care assumed from [facility], [place].  Please see his full H&P.  In short,  Amber Faulkner is a 58 y.o. female with a PMH of HIV, Anxiety, MS, CVA, presents for altered mental status. Last known normal last night. Patient was found by caregiver on the floor.   Physical Exam  BP (!) 106/56   Pulse 95   Temp 98.4 F (36.9 C) (Rectal)   Resp (!) 24   SpO2 97%   Physical Exam Vitals signs and nursing note reviewed.  Constitutional:      General: She is not in acute distress.    Appearance: She is well-developed. She is not diaphoretic.  HENT:     Head: Normocephalic and atraumatic.     Nose: No congestion or rhinorrhea.     Mouth/Throat:     Mouth: Mucous membranes are moist.     Pharynx: No posterior oropharyngeal erythema.  Eyes:     Extraocular Movements: Extraocular movements intact.     Conjunctiva/sclera: Conjunctivae normal.     Pupils: Pupils are equal, round, and reactive to light.  Neck:     Musculoskeletal: Normal range of motion.  Cardiovascular:     Rate and Rhythm: Normal rate and regular rhythm.     Heart sounds: Normal heart sounds. No murmur. No friction rub. No gallop.   Pulmonary:     Effort: Pulmonary effort is normal. No respiratory distress.     Breath sounds: Normal breath sounds. No wheezing or rales.  Abdominal:     Palpations: Abdomen is soft.     Tenderness: There is no abdominal tenderness.  Musculoskeletal: Normal range of motion.  Skin:    General: Skin is warm.     Findings: No erythema or rash.  Neurological:     Mental Status: She is alert.     Comments: Patient is now alert and oriented to person and place. Not oriented to time. Slow to answer, but is answering some questions.      ED Course/Procedures     Procedures  MDM  Patient present with AMS. Labs, vitals, and imaging reviewed. Leukocytosis noted at 11.1. Patient is afebrile. CXR, CT cervical spine, CXR, and pelvis x rays are negative.  COVID-19 test is negative. Mild  hyperglycemia noted at 215. UA reveals glucose and small Hgb. UDS positive for opiates. Narcan given at arrival. IVF are being given. Patient has become more responsive while in the ER. Patient pending hospitalist consult for admission. Discussed case with hospitalist. Hospitalist has agreed to admit patient.   18:47 Patient is requesting to be discharged and is willing to leave AMA at this time. Patient states she will have her friend pick her up. Patient is alert and oriented x 4. Patient is able to have a full conversation without difficulty. Patient states she is back to her baseline. Patient states she has had these symptoms before and is not concerned. Patient states she will follow up with her PCP.   The patient and I discussed the nature and purpose, risks and benefits, as well as, the alternatives of treatment. Time was given to allow the opportunity to ask questions and consider options. After the discussion, the patient decided to refuse the offerred treatment. The patient was informed that refusal could lead to, but was not limited to, death, permanent disability, or severe pain. If present, I asked the relatives and/or significant others to dissuade the patient without success. Prior to refusing, I determined that the patient had the capacity to  make their decision and understood the consequences of that decision. After refusal, I made every reasonable effort to treat them to the best of my ability.  The patient was notified that they may return to the emergency department at any time for further treatment.    I have discussed my concerns as her provider and the possibility that this may worsen. I have specifically discussed that without further evaluation I cannot guarantee there is not a life threatening event occuring.  Pt is A&Ox4, her own POA and states understanding of my concerns and the possible consequences.  I have made pt aware that this is an [place] discharge, but she may return at any  time for further evaluation and treatment.      Darlin Drop [place], [place] 01/02/19 Randol Kern    Julianne Rice, MD 01/02/19 Joen Laura

## 2019-01-02 NOTE — ED Notes (Signed)
Sitter has been requested pt attempts to get up off the bed intermittently bed alarm haS ALSO BEEN PLAC ED   Bed lowered at the head to try to keep her down

## 2019-01-02 NOTE — ED Notes (Signed)
The pt has decided that she does  Not want to stay here she wants to leave widely alert noq  Making calls on her phone

## 2019-01-07 LAB — CULTURE, BLOOD (ROUTINE X 2)
Culture: NO GROWTH
Culture: NO GROWTH
Special Requests: ADEQUATE
Special Requests: ADEQUATE

## 2019-01-17 ENCOUNTER — Other Ambulatory Visit: Payer: Self-pay | Admitting: Family Medicine

## 2019-02-13 ENCOUNTER — Other Ambulatory Visit: Payer: Self-pay | Admitting: Family Medicine

## 2019-03-09 ENCOUNTER — Encounter: Payer: Self-pay | Admitting: Family Medicine

## 2019-03-11 ENCOUNTER — Ambulatory Visit: Payer: Medicare Other | Admitting: *Deleted

## 2019-03-11 NOTE — Progress Notes (Unsigned)
Subjective:   Amber Faulkner is a 58 y.o. female who presents for an Initial Medicare Annual Wellness Visit.  Review of Systems    No ROS.  Medicare Wellness Virtual Visit.  Visual/audio telehealth visit, UTA vital signs.   See social history for additional risk factors.       Sleep patterns:    Home Safety/Smoke Alarms: Feels safe in home. Smoke alarms in place.  Living environment; Seat Belt Safety/Bike Helmet: Wears seat belt.   Female:   Pap-       Mammo-       Dexa scan- not eligible due to age       45-     Objective:    There were no vitals filed for this visit. There is no height or weight on file to calculate BMI.  Advanced Directives 10/02/2018 09/24/2018 09/19/2018 08/15/2018 11/21/2013  Does Patient Have a Medical Advance Directive? No Yes No No Patient has advance directive, copy not in chart  Type of Advance Directive - [place];Living will  Does patient want to make changes to medical advance directive? - Yes (Inpatient - patient requests chaplain consult to change a medical advance directive) - - -  Copy of [place] in Chart? - No - copy requested - - -  Would patient like information on creating a medical advance directive? - - No - Patient declined No - Patient declined -  Pre-existing out of facility DNR order (yellow form or pink MOST form) - - - - Yellow form placed in chart (order not valid for inpatient use)    Current Medications (verified) Outpatient Encounter Medications as of 03/11/2019  Medication Sig  . albuterol (PROVENTIL HFA;VENTOLIN HFA) 108 (90 Base) MCG/ACT inhaler Inhale 2 puffs into the lungs every 6 (six) hours as needed for wheezing or shortness of breath. (Patient taking differently: Inhale 2 puffs into the lungs every 6 (six) hours as needed for wheezing or shortness of breath. )  . AMBULATORY NON FORMULARY MEDICATION Rolling Walker use as needed.  Disp 1 G35  .  aspirin 81 MG chewable tablet Chew 1 tablet by mouth daily.  . calcium carbonate (CALCIUM 600) 600 MG TABS tablet Take 600 mg by mouth 2 (two) times daily with a meal.  . carisoprodol (SOMA) 350 MG tablet Take 350 mg by mouth 3 (three) times daily.  . cholecalciferol (VITAMIN D3) 25 MCG (1000 UT) tablet Take 1,000 Units by mouth daily.  . DULoxetine (CYMBALTA) 30 MG capsule Take 30 mg by mouth 2 (two) times daily.  Marland Kitchen EASY TOUCH PEN NEEDLES 32G X 6 MM MISC USE AS DIRECTED  . ferrous sulfate 325 (65 FE) MG tablet Take 1 tablet (325 mg total) by mouth daily.  . Fluticasone-Salmeterol (ADVAIR DISKUS) 250-50 MCG/DOSE AEPB Inhale 1 puff into the lungs 2 (two) times daily.  Marland Kitchen gabapentin (NEURONTIN) 300 MG capsule Take 1 capsule (300 mg total) by mouth 4 (four) times daily.  . Insulin Glargine, 1 Unit Dial, (TOUJEO SOLOSTAR) 300 UNIT/ML SOPN Inject 20 Units into the skin 2 (two) times a day.  . insulin lispro (HUMALOG) 100 UNIT/ML KwikPen Inject 0.15 mLs (15 Units total) into the skin 3 (three) times daily.  . Lacosamide (VIMPAT) 100 MG TABS Take 100 mg by mouth 2 (two) times daily.   Elmore Guise Devices (SIMPLE DIAGNOSTICS LANCING DEV) MISC USE TO CHECK BLOOD SUGAR THREE TIMES A DAY  . losartan (COZAAR)  25 MG tablet   . omeprazole (PRILOSEC) 40 MG capsule Take 1 capsule (40 mg total) by mouth daily.  . ondansetron (ZOFRAN-ODT) 8 MG disintegrating tablet Take 1 tablet (8 mg total) by mouth every 8 (eight) hours as needed for nausea or vomiting.  . ONE TOUCH ULTRA TEST test strip USE TO CHECK BLOOD SUGAR THREE TIMES A DAY (Patient taking differently: 1 each by Other route 3 (three) times daily. )  . Oxycodone HCl 20 MG TABS Take 20 mg by mouth every 6 (six) hours as needed (pain). And at bedtime as needed  . PHARMACIST CHOICE LANCETS MISC USE TO CHECK BLOOD SUGAR THREE TIMES A DAY (Patient taking differently: 3 (three) times daily. )  . potassium chloride (K-DUR) 10 MEQ tablet Take 1 tablet (10 mEq total) by  mouth daily.  . QUEtiapine (SEROQUEL) 200 MG tablet Take 1 tablet (200 mg total) by mouth at bedtime.  . rosuvastatin (CRESTOR) 20 MG tablet   . SPIRIVA HANDIHALER 18 MCG inhalation capsule Place 1 capsule into inhaler and inhale daily.  Marland Kitchen triamcinolone cream (KENALOG) 0.1 % Apply 1 application topically 2 (two) times daily.  Marland Kitchen venlafaxine XR (EFFEXOR-XR) 75 MG 24 hr capsule Take 1 capsule (75 mg total) by mouth daily with breakfast. (Patient taking differently: Take 75 mg by mouth daily. Take 1 capsule (75 mg total) by mouth daily with breakfast.)  . vitamin B-12 (CYANOCOBALAMIN) 1000 MCG tablet Take 1,000 mcg by mouth daily.   No facility-administered encounter medications on file as of 03/11/2019.     Allergies (verified) Ampicillin, Cephalosporins, Penicillin g, Vancomycin, Penicillins, Carisoprodol, Cymbalta [duloxetine hcl], Feraheme [ferumoxytol], and Lisinopril   History: Past Medical History:  Diagnosis Date  . ADD (attention deficit disorder)   . Anxiety   . Chronic pain   . Diabetes mellitus   . HIV infection ([place])   . Hyperlipidemia   . Neuromuscular disorder ([place])   . Pneumonia 09/2018   Past Surgical History:  Procedure Laterality Date  . ABDOMINAL HYSTERECTOMY  1991   complete  . CARPAL TUNNEL RELEASE  1993-1994   both wrist  . CERVICAL DISCECTOMY  03/25/2017   Dr. Cyndia Diver, [place]  . CESAREAN SECTION  1980  . HERNIA REPAIR  1976  . SPLENECTOMY  09/2016  . TARSAL TUNNEL RELEASE  1993   left ankle  . TUMOR REMOVAL  1993   left thigh   Family History  Problem Relation Age of Onset  . Cancer Sister        hepatic and pancreatic  . Cancer Mother        brain and lung  . COPD Mother   . Hyperlipidemia Father   . Hypertension Father   . Diabetes Father   . Heart disease Father   . Cancer Maternal Aunt        breast   Social History   Socioeconomic History  . Marital status: Single    Spouse name: Not on file  . Number of children: Not on  file  . Years of education: Not on file  . Highest education level: Not on file  Occupational History  . Not on file  Social Needs  . Financial resource strain: Not on file  . Food insecurity    Worry: Not on file    Inability: Not on file  . Transportation needs    Medical: Not on file    Non-medical: Not on file  Tobacco Use  . Smoking status: Current Every  Day Smoker    Packs/day: 1.50    Types: Cigarettes  . Smokeless tobacco: Never Used  Substance and Sexual Activity  . Alcohol use: No  . Drug use: No  . Sexual activity: Not on file  Lifestyle  . Physical activity    Days per week: Not on file    Minutes per session: Not on file  . Stress: Not on file  Relationships  . Social Herbalist on phone: Not on file    Gets together: Not on file    Attends religious service: Not on file    Active member of club or organization: Not on file    Attends meetings of clubs or organizations: Not on file    Relationship status: Not on file  Other Topics Concern  . Not on file  Social History Narrative  . Not on file    Tobacco Counseling Ready to quit: Not Answered Counseling given: Not Answered   Clinical Intake:                        Activities of Daily Living In your present state of health, do you have any difficulty performing the following activities: 09/21/2018 08/15/2018  Hearing? N N  Vision? N N  Difficulty concentrating or making decisions? Y N  Walking or climbing stairs? Y Y  Dressing or bathing? N N  Doing errands, shopping? N N  Some recent data might be hidden     Immunizations and Health Maintenance Immunization History  Administered Date(s) Administered  . HiB (PRP-T) 09/09/2016  . Influenza-Unspecified 04/15/2013  . Meningococcal B Recombinant 10/28/2016  . Meningococcal Conjugate 09/09/2016, 10/28/2016, 10/28/2016  . Pneumococcal Conjugate-13 03/22/2016  . Pneumococcal Polysaccharide-23 01/28/2009, 09/09/2016    Health Maintenance Due  Topic Date Due  . MAMMOGRAM  01/05/2013  . COLONOSCOPY  01/05/2013  . FOOT EXAM  02/03/2019    Patient Care Team: Hali Marry, MD as PCP - Carlyle Lipa, MD as Referring Physician (Neurology) Kriste Basque, MD as Referring Physician (Pain Medicine)  Indicate any recent Medical Services you may have received from other than [facility] providers in the past year (date may be approximate).     Assessment:   This is a routine wellness examination for Amber Faulkner.  Hearing/Vision screen No exam data present  Dietary issues and exercise activities discussed:    Goals   None    Depression Screen PHQ 2/9 Scores 12/04/2018 05/29/2018 02/06/2018 05/22/2017 05/22/2017 09/26/2014  PHQ - 2 Score 1 2 5 2  0 2  PHQ- 9 Score - 11 9 - - 3    Fall Risk Fall Risk  09/26/2014  Falls in the past year? No    Is the patient's home free of loose throw rugs in walkways, pet beds, electrical cords, etc?   {Blank single:19197::"yes","no"}      Grab bars in the bathroom? {Blank single:19197::"yes","no"}      Handrails on the stairs?   {Blank single:19197::"yes","no"}      Adequate lighting?   {Blank single:19197::"yes","no"}  Timed Get Up and Go Performed ***  Cognitive Function:        Screening Tests Health Maintenance  Topic Date Due  . MAMMOGRAM  01/05/2013  . COLONOSCOPY  01/05/2013  . FOOT EXAM  02/03/2019  . OPHTHALMOLOGY EXAM  07/11/2019 (Originally 10/12/2016)  . INFLUENZA VACCINE  07/10/2020 (Originally 02/09/2019)  . HEMOGLOBIN A1C  06/06/2019  . TETANUS/TDAP  09/26/2021  . PNEUMOCOCCAL POLYSACCHARIDE  VACCINE AGE 9-64 HIGH RISK  Completed  . Hepatitis C Screening  Completed  . HIV Screening  Completed    Qualifies for Shingles Vaccine? ***  Cancer Screenings: Lung: Low Dose CT Chest recommended if Age 49-80 years, 30 pack-year currently smoking OR have quit w/in 15years. Patient {DOES NOT does:27190::"does not"} qualify. Breast: Up to date  on Mammogram? {Yes/No:30480221}   Up to date of Bone Density/Dexa? {Yes/No:30480221} Colorectal: ***  Additional Screenings: *** Hepatitis C Screening:      Plan:   ***  I have personally reviewed and noted the following in the patient's chart:   . Medical and social history . Use of alcohol, tobacco or illicit drugs  . Current medications and supplements . Functional ability and status . Nutritional status . Physical activity . Advanced directives . List of other physicians . Hospitalizations, surgeries, and ER visits in previous 12 months . Vitals . Screenings to include cognitive, depression, and falls . Referrals and appointments  In addition, I have reviewed and discussed with patient certain preventive protocols, quality metrics, and best practice recommendations. A written personalized care plan for preventive services as well as general preventive health recommendations were provided to patient.     Joanne Chars, LPN   [id number]

## 2019-03-12 DEATH — deceased

## 2019-03-22 NOTE — Progress Notes (Signed)
This patient was a no show for this appointment

## 2020-01-30 IMAGING — DX DG CHEST 2V
2 series · 2 of 2 positions shown · non-contrast
Comparison: 05/24/2016

CLINICAL DATA: Cough for 3 weeks.

EXAM:
CHEST - 2 VIEW

[chest pa]
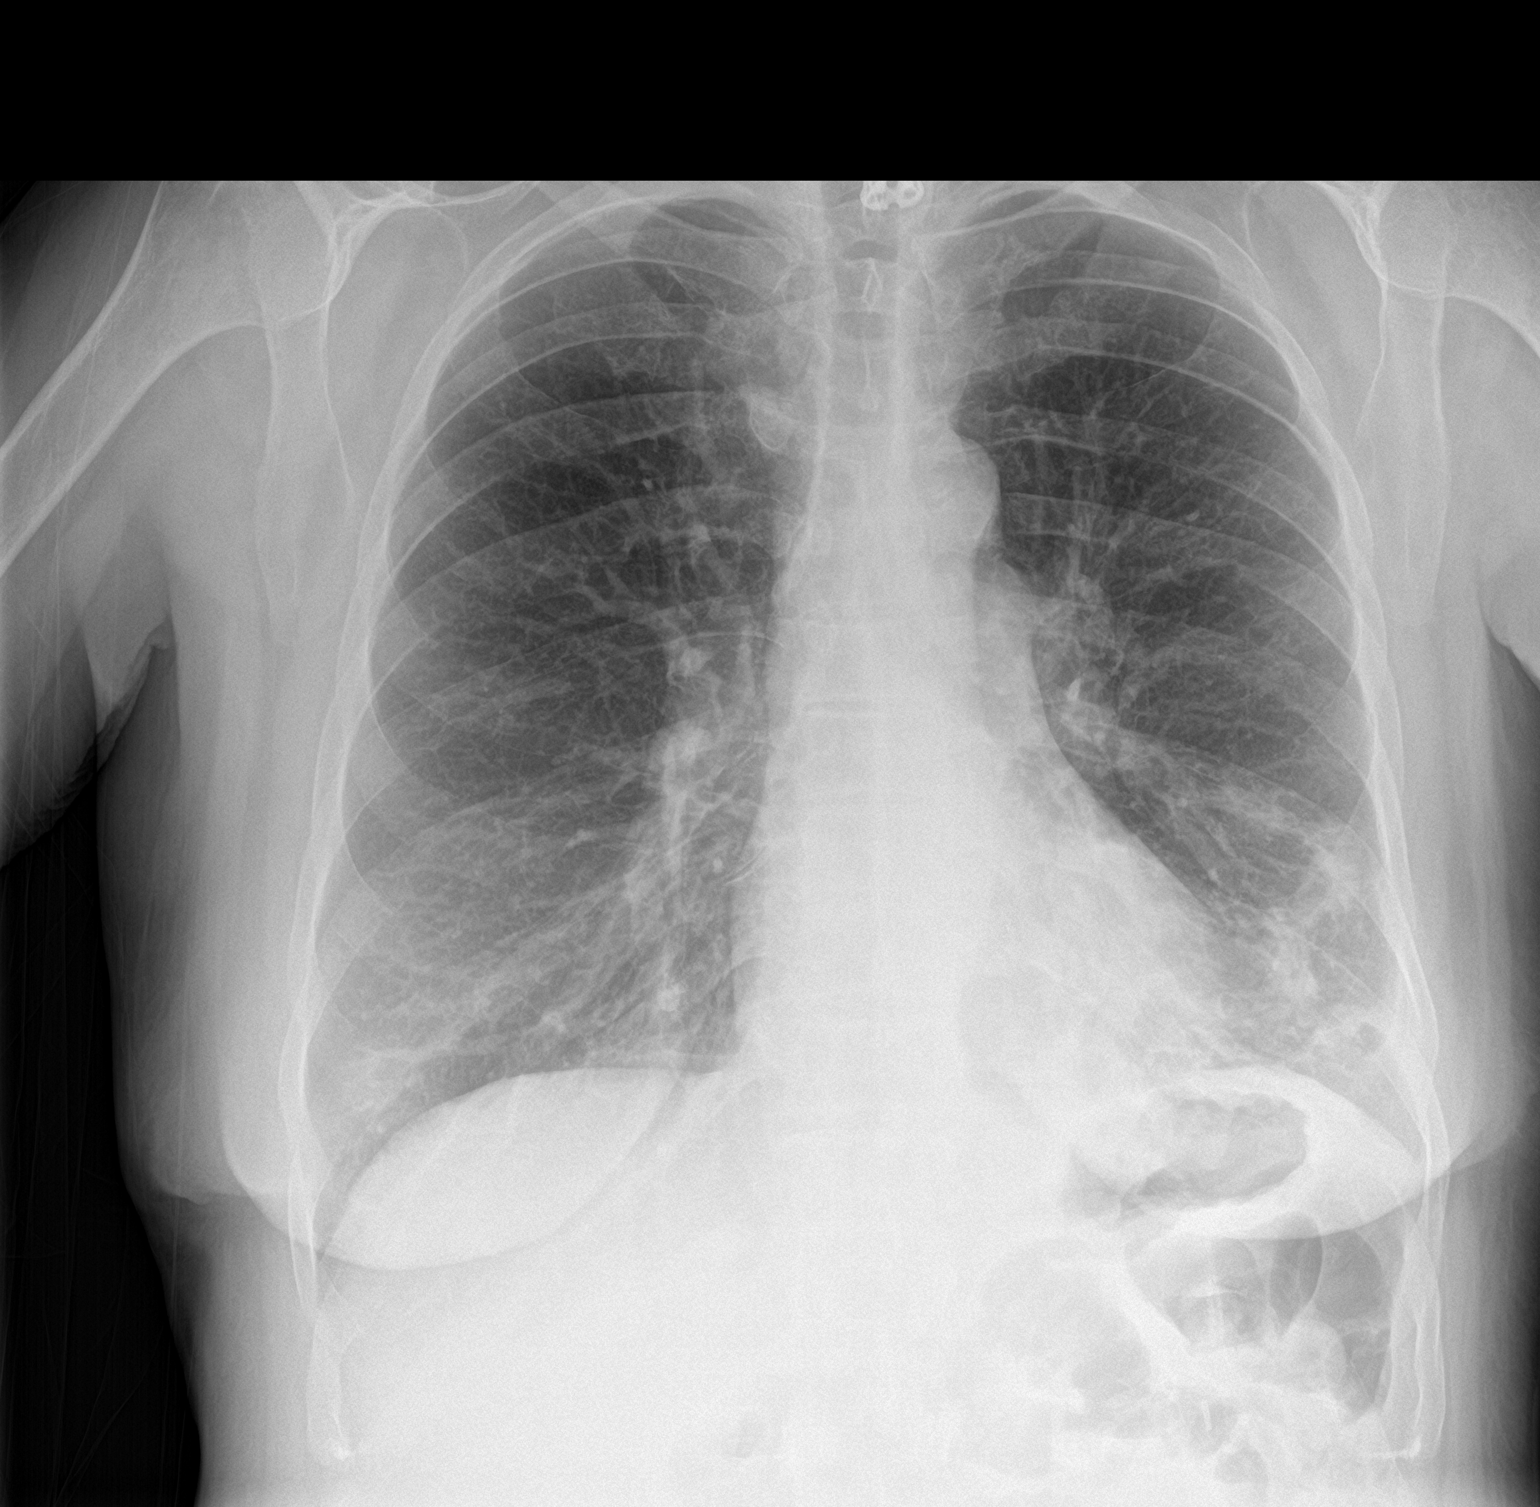

[chest lat]
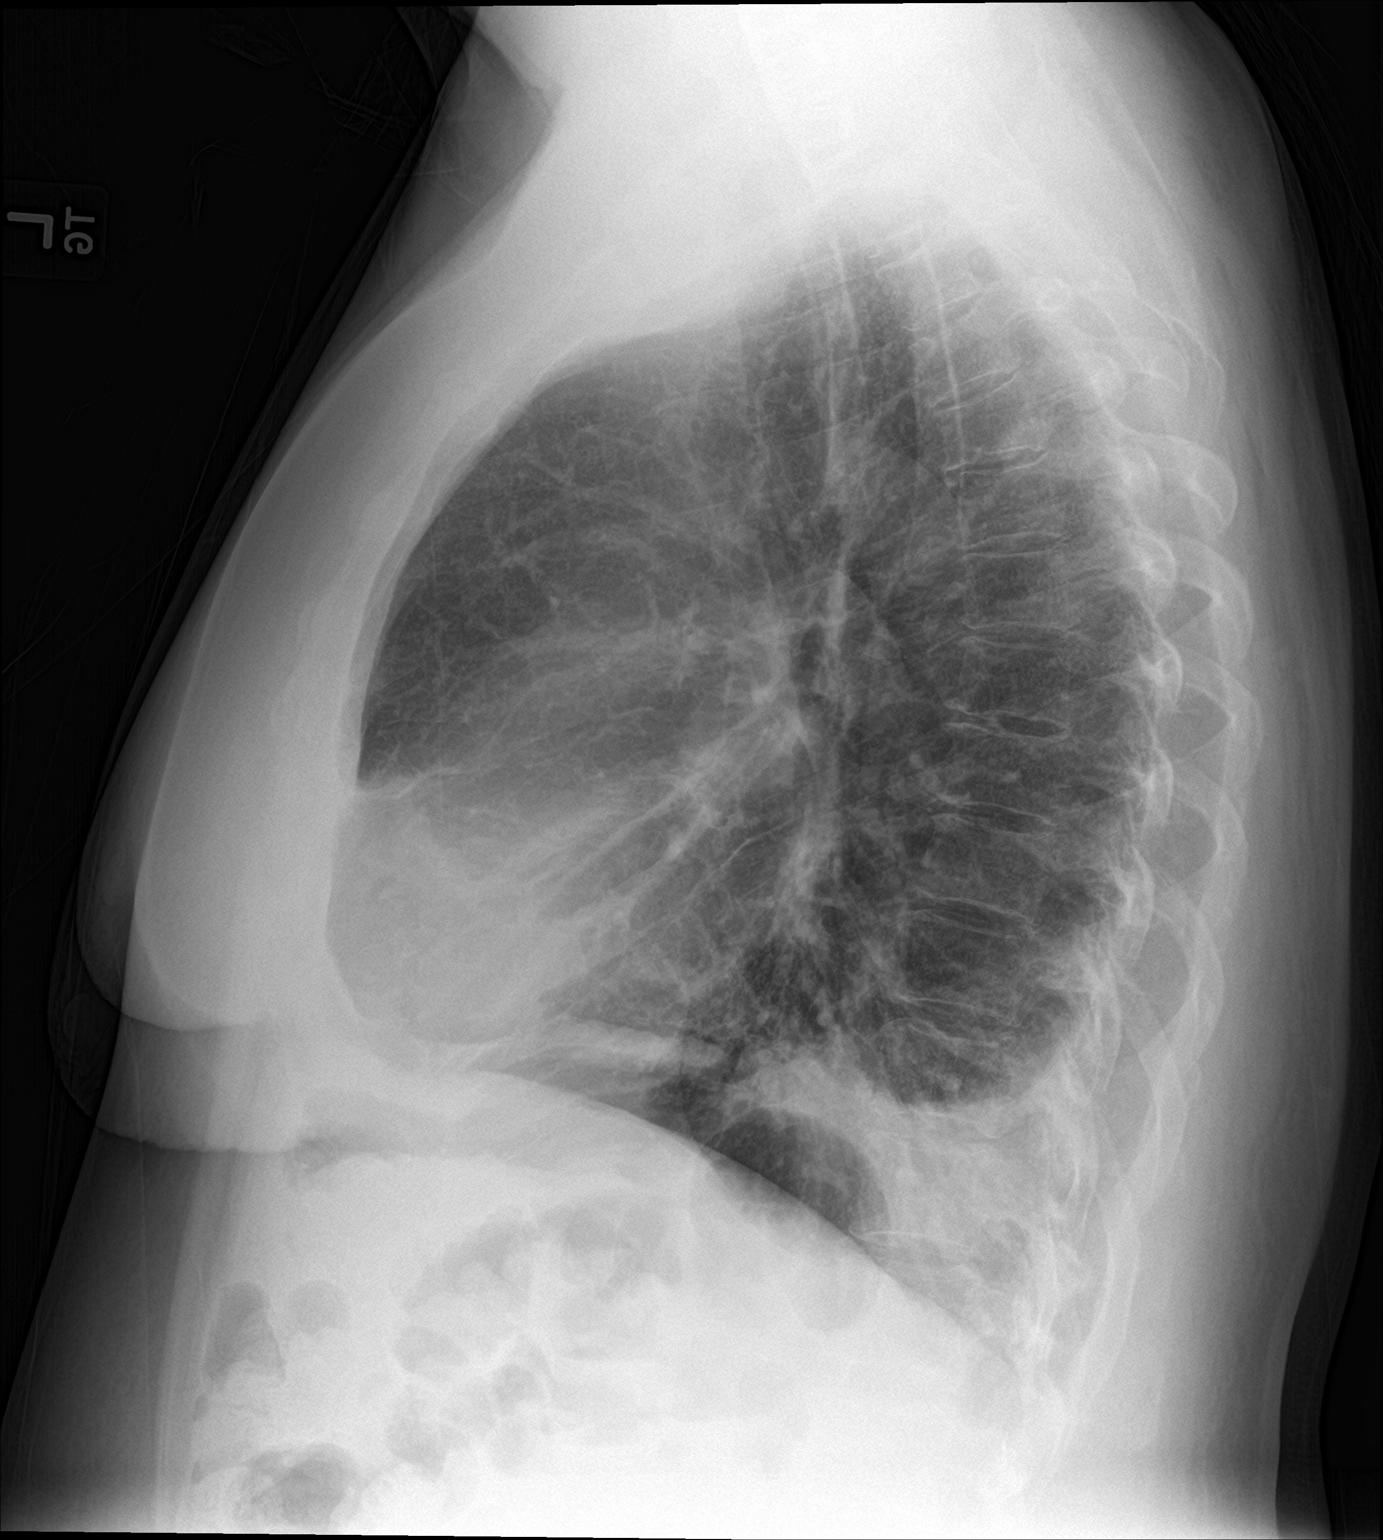

[2 of 2 positions shown; findings below may reference images not displayed]

FINDINGS: The cardiac silhouette, mediastinal and hilar contours are within
normal limits and stable.

Mild chronic emphysematous changes. Patchy bibasilar airspace
opacities consistent with pneumonia. No pleural effusion. No
worrisome pulmonary lesions. The bony thorax is intact.
IMPRESSION: Bibasilar infiltrates, left greater than right.

Followup PA and lateral chest X-ray is recommended in 3-4 weeks
following trial of antibiotic therapy to ensure resolution and
exclude underlying malignancy.

## 2020-03-10 IMAGING — DX PORTABLE CHEST - 1 VIEW
1 series · 1 of 1 positions shown · non-contrast
Comparison: 09/19/2010

CLINICAL DATA: Cough and shortness of breath.

EXAM:
PORTABLE CHEST 1 VIEW

[chest]
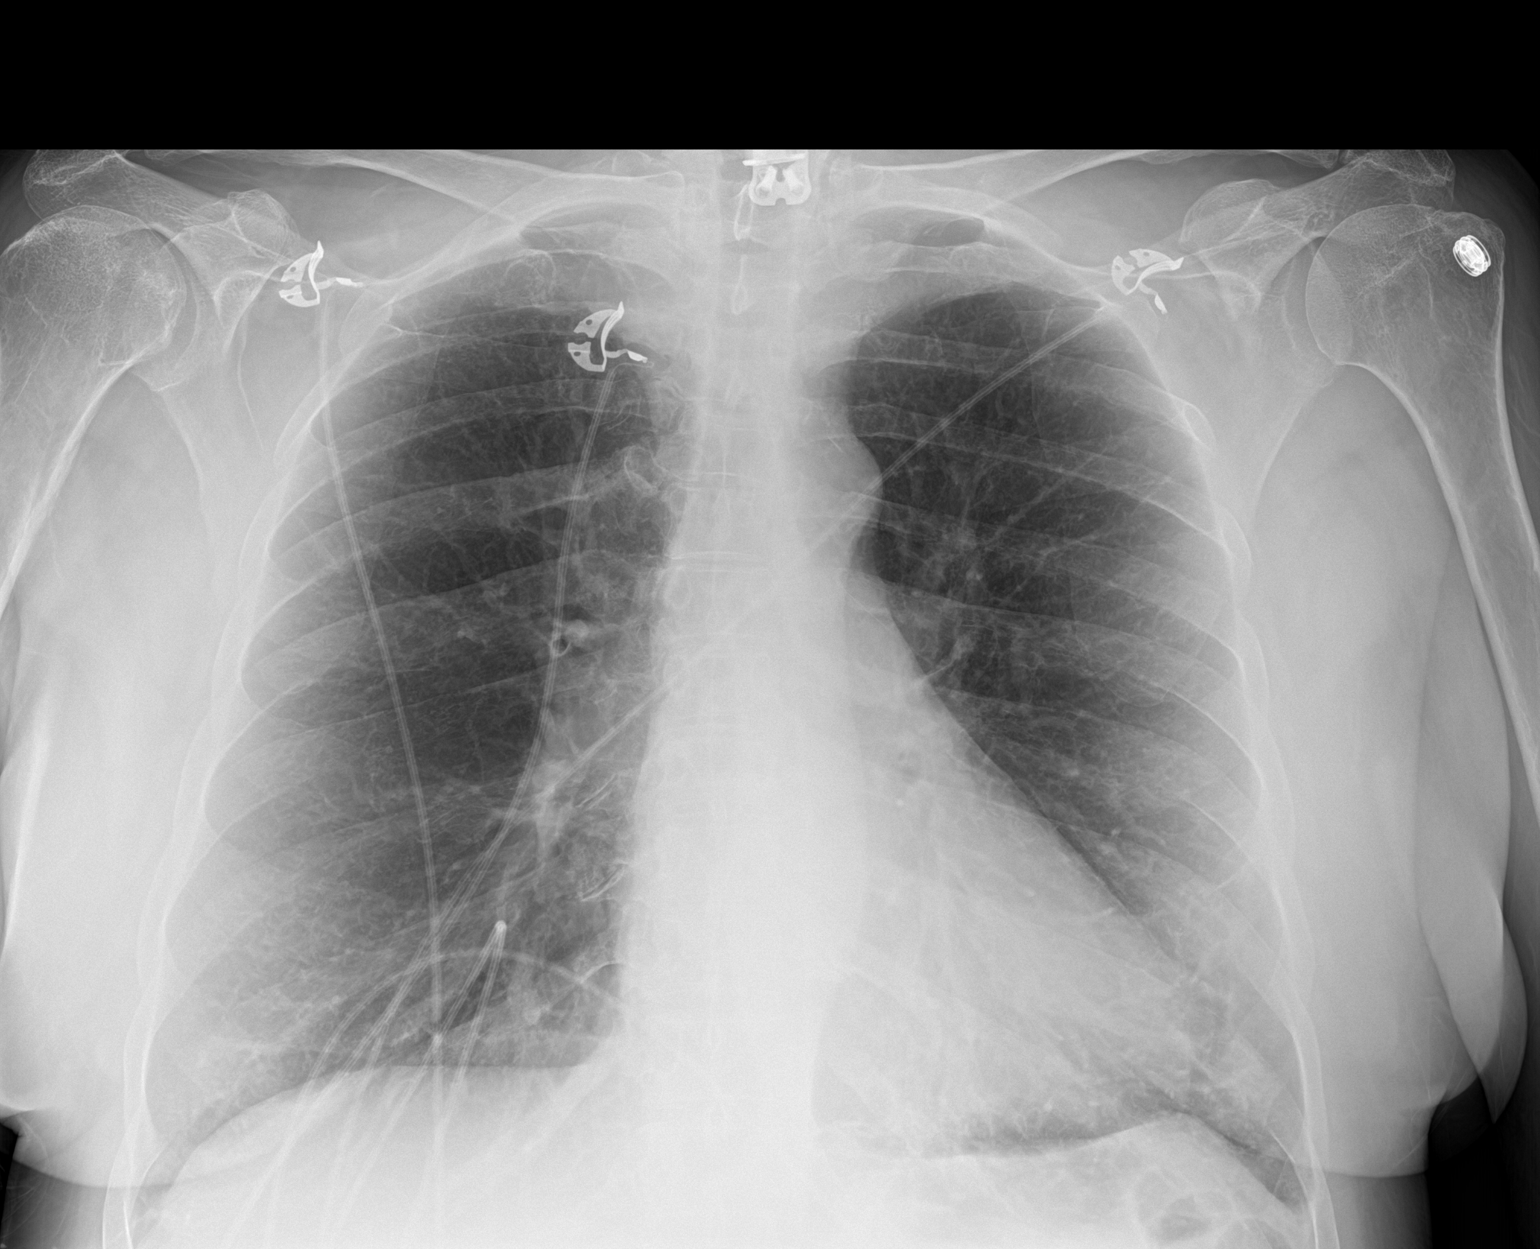

[1 of 1 positions shown; findings below may reference images not displayed]

FINDINGS: The cardiomediastinal silhouette is unremarkable.

Streaky LEFT basilar opacities are unchanged.

There is no evidence of pulmonary edema, suspicious pulmonary
nodule/mass, pleural effusion, or pneumothorax.

No acute bony abnormalities are identified. Cervical spine fusion
changes again noted.
IMPRESSION: Unchanged appearance of the chest with streaky LEFT basilar
opacities again noted.

## 2020-03-12 IMAGING — CT CT OF THE RIGHT FOOT WITHOUT CONTRAST
2 series · 13 of 20 positions shown, 16 images · non-contrast
Comparison: Radiographs dated 09/23/2018

CLINICAL DATA: Foot fracture.

EXAM:
CT OF THE RIGHT FOOT WITHOUT CONTRAST
TECHNIQUE: Multidetector CT imaging of the right foot was performed according
to the standard protocol. Multiplanar CT image reconstructions were
also generated.

[Series 5: lfov ext 3.0 b40s · axial · 0.24mm/px · z∈[+177,+399]mm · 10 of 88 slices shown, 13 images]
[im 7/88  soft-tissue]
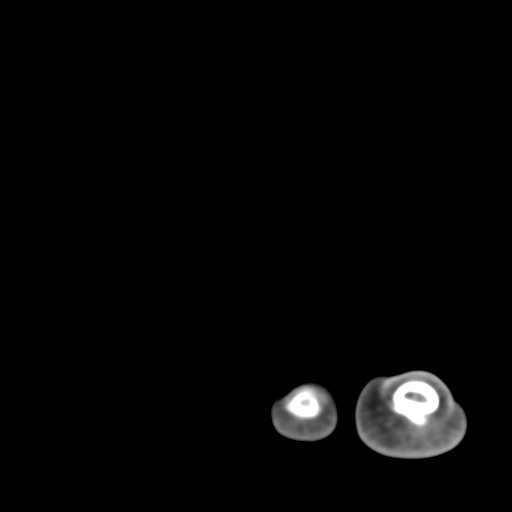
[im 7/88  bone]
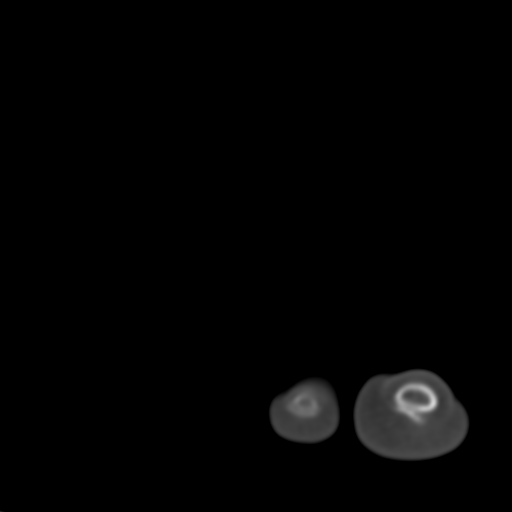
[im 14/88  bone]
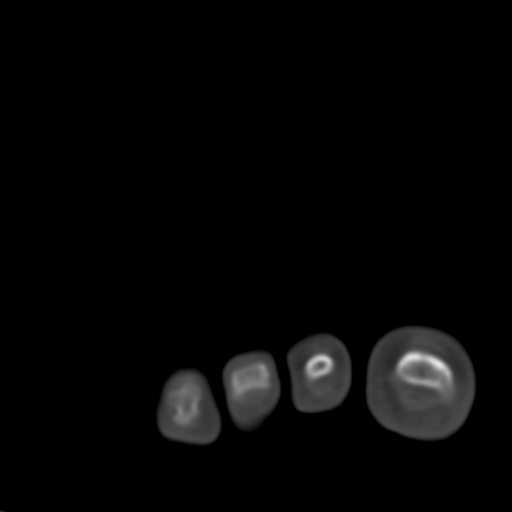
[im 27/88  bone]
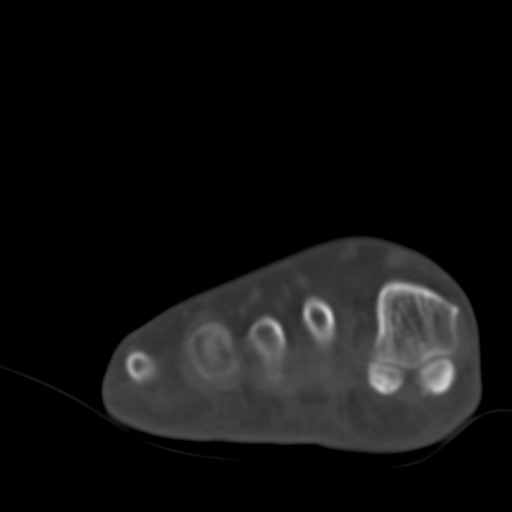
[im 34/88  bone]
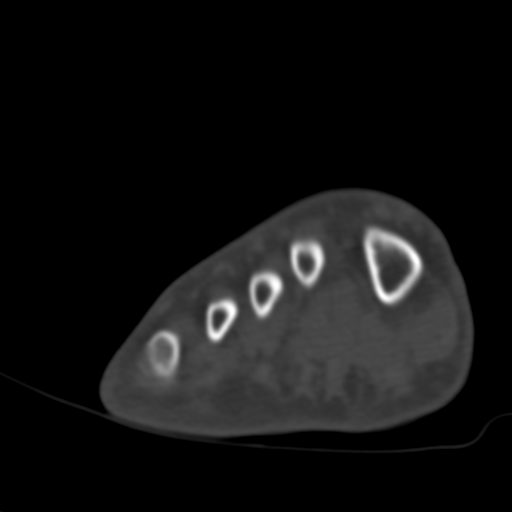
[im 41/88  soft-tissue]
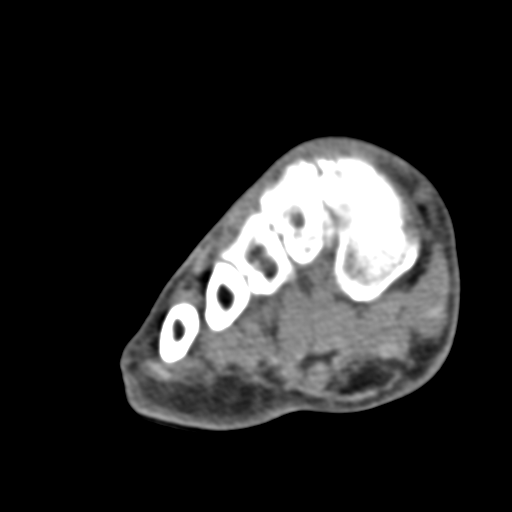
[im 41/88  bone]
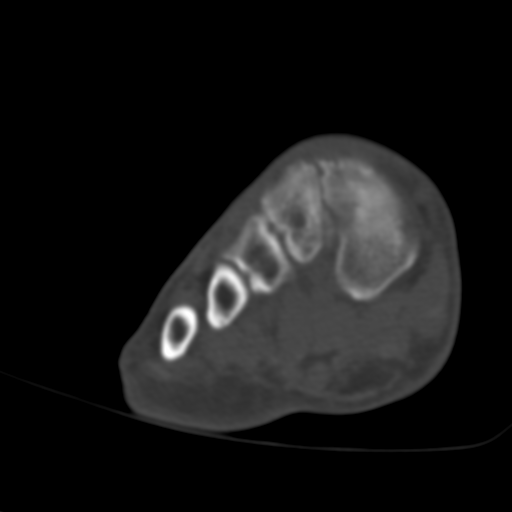
[im 47/88  bone]
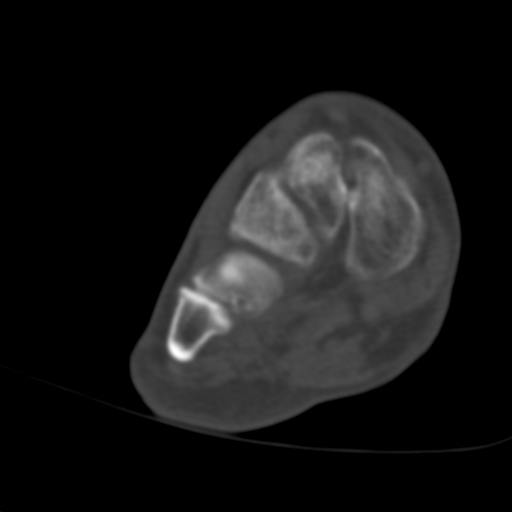
[im 54/88  bone]
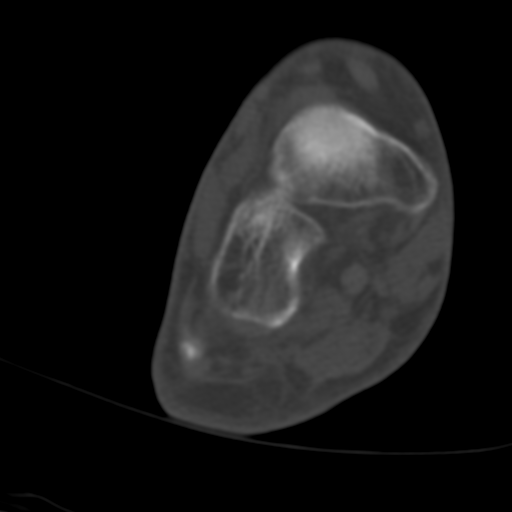
[im 67/88  bone]
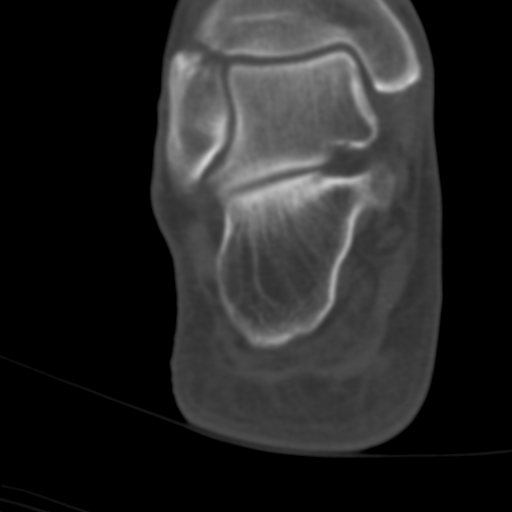
[im 74/88  soft-tissue]
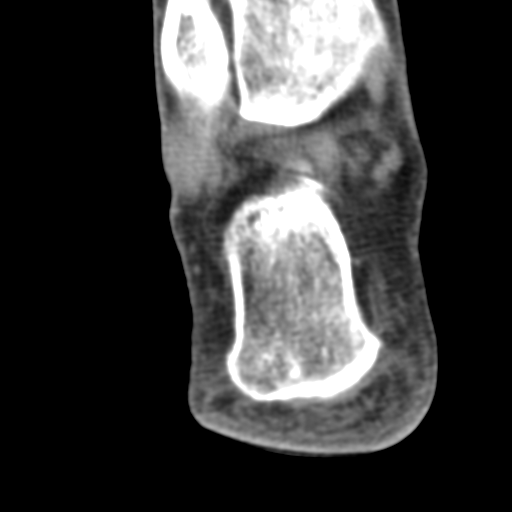
[im 74/88  bone]
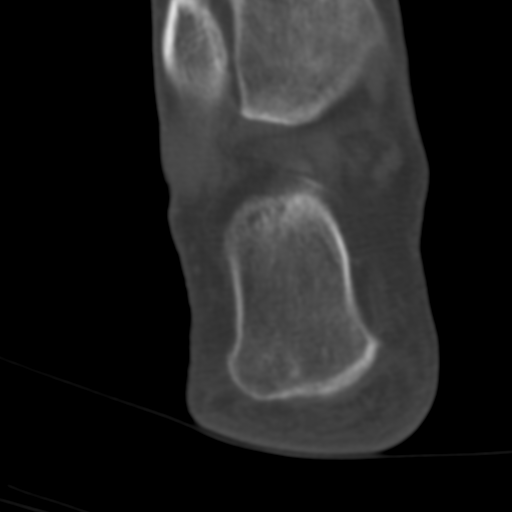
[im 81/88  bone]
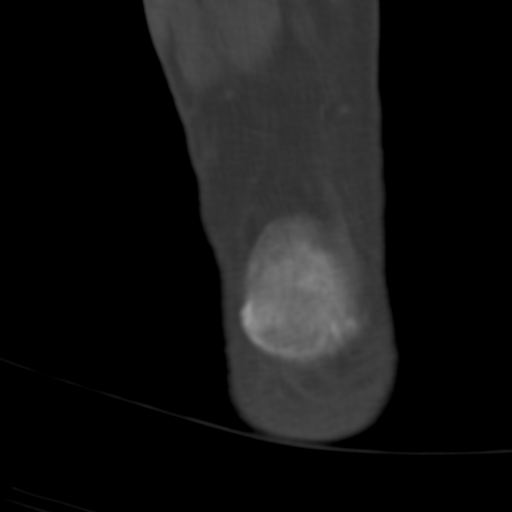

[Series 13: coronal st · coronal · 0.30mm/px · 3 of 137 slices shown]
[im 28/137  bone]
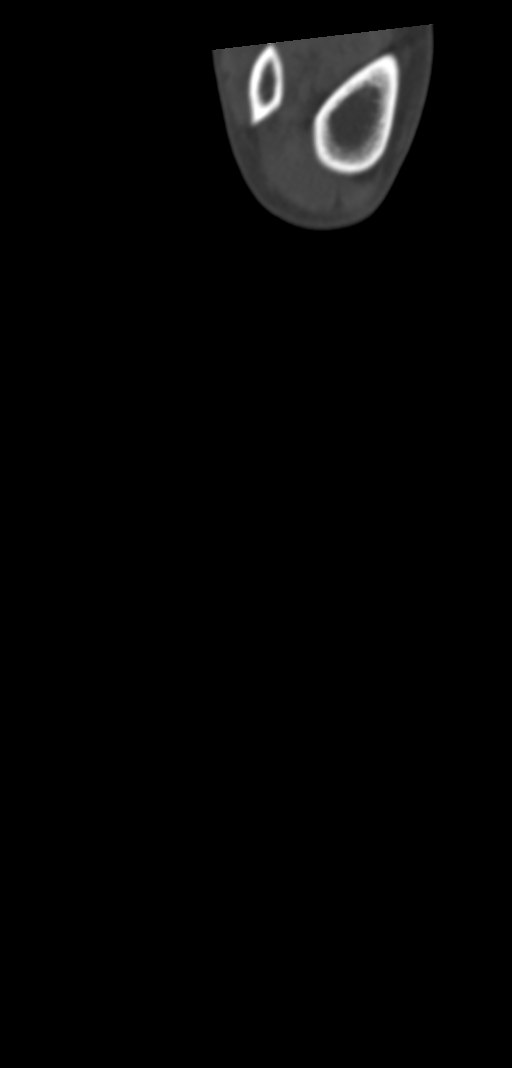
[im 55/137  bone]
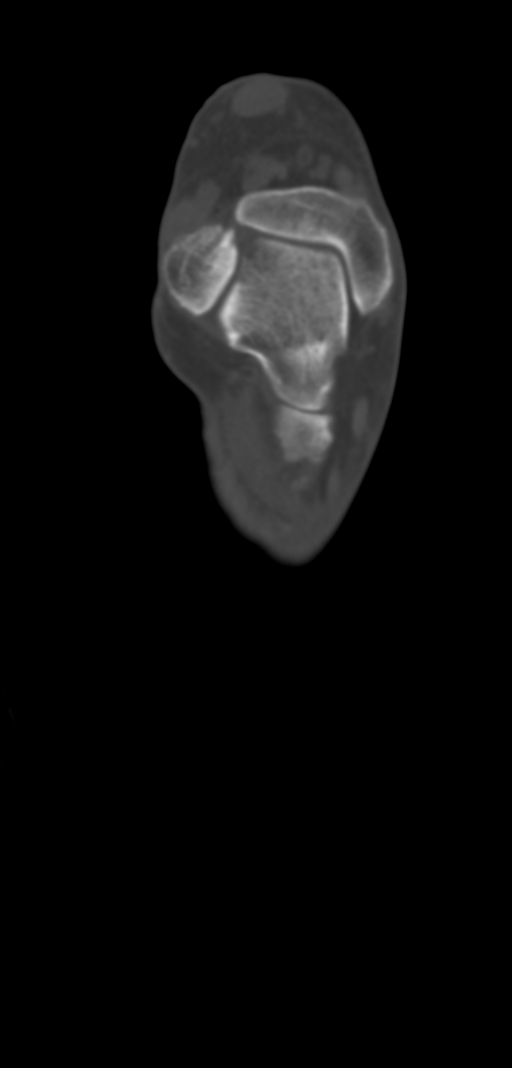
[im 82/137  bone]
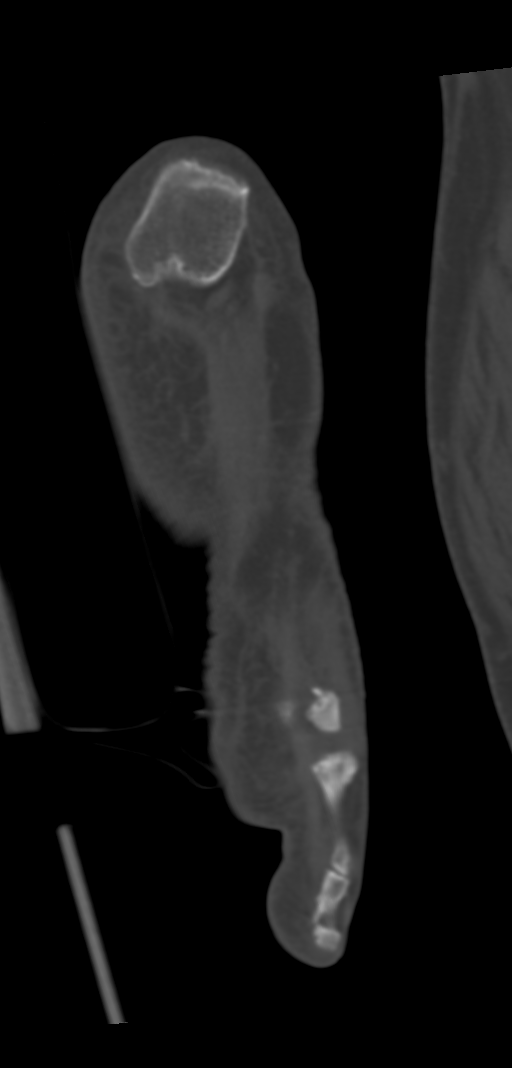

[13 of 20 positions shown; findings below may reference images not displayed]

FINDINGS: Bones/Joint/Cartilage

There are old incompletely healed fractures of the medial cuneiform
and of the bases of the second and third metatarsals. There is
slight widening of the joint space between the base of the second
metatarsal and the medial cuneiform but at least some components of
the Lisfranc ligament are intact and this slight widening is not
felt to be significant. There are no acute fractures.

Ligaments

Suboptimally assessed by CT. No abnormal widening of the Lisfranc
joints.

Muscles and Tendons

Negative.

Soft tissues

Negative.
IMPRESSION: Old incompletely healed fractures of the midfoot as described. No
significant widening of the Lisfranc joints.

## 2021-03-11 DEATH — deceased
